# Patient Record
Sex: Female | Born: 1980 | Race: White | Hispanic: No | Smoking: Current every day smoker
Health system: Southern US, Community
[De-identification: ages and names within clinical notes are randomized; demographics above are authoritative.]

## PROBLEM LIST (undated history)

## (undated) DIAGNOSIS — G629 Polyneuropathy, unspecified: Secondary | ICD-10-CM

## (undated) DIAGNOSIS — D649 Anemia, unspecified: Secondary | ICD-10-CM

## (undated) DIAGNOSIS — R87629 Unspecified abnormal cytological findings in specimens from vagina: Secondary | ICD-10-CM

## (undated) DIAGNOSIS — F32A Depression, unspecified: Secondary | ICD-10-CM

## (undated) DIAGNOSIS — F329 Major depressive disorder, single episode, unspecified: Secondary | ICD-10-CM

## (undated) DIAGNOSIS — K7460 Unspecified cirrhosis of liver: Secondary | ICD-10-CM

## (undated) DIAGNOSIS — G43909 Migraine, unspecified, not intractable, without status migrainosus: Secondary | ICD-10-CM

## (undated) DIAGNOSIS — Z3490 Encounter for supervision of normal pregnancy, unspecified, unspecified trimester: Principal | ICD-10-CM

## (undated) HISTORY — DX: Depression, unspecified: F32.A

## (undated) HISTORY — PX: BACK SURGERY: SHX140

## (undated) HISTORY — DX: Unspecified abnormal cytological findings in specimens from vagina: R87.629

## (undated) HISTORY — DX: Major depressive disorder, single episode, unspecified: F32.9

## (undated) HISTORY — DX: Polyneuropathy, unspecified: G62.9

## (undated) HISTORY — DX: Migraine, unspecified, not intractable, without status migrainosus: G43.909

## (undated) HISTORY — DX: Encounter for supervision of normal pregnancy, unspecified, unspecified trimester: Z34.90

## (undated) HISTORY — PX: TONSILLECTOMY AND ADENOIDECTOMY: SHX28

---

## 2001-11-28 ENCOUNTER — Other Ambulatory Visit: Admission: RE | Admit: 2001-11-28 | Discharge: 2001-11-28 | Payer: Self-pay | Admitting: Obstetrics and Gynecology

## 2002-05-31 ENCOUNTER — Other Ambulatory Visit: Admission: RE | Admit: 2002-05-31 | Discharge: 2002-05-31 | Payer: Self-pay | Admitting: Obstetrics and Gynecology

## 2002-10-16 ENCOUNTER — Ambulatory Visit (HOSPITAL_COMMUNITY): Admission: RE | Admit: 2002-10-16 | Discharge: 2002-10-16 | Payer: Self-pay | Admitting: Obstetrics and Gynecology

## 2002-10-16 ENCOUNTER — Encounter: Payer: Self-pay | Admitting: Obstetrics and Gynecology

## 2002-12-27 ENCOUNTER — Other Ambulatory Visit: Admission: RE | Admit: 2002-12-27 | Discharge: 2002-12-27 | Payer: Self-pay | Admitting: Obstetrics and Gynecology

## 2003-02-18 ENCOUNTER — Observation Stay (HOSPITAL_COMMUNITY): Admission: AD | Admit: 2003-02-18 | Discharge: 2003-02-18 | Payer: Self-pay | Admitting: Obstetrics and Gynecology

## 2003-02-19 ENCOUNTER — Encounter: Payer: Self-pay | Admitting: Obstetrics and Gynecology

## 2003-02-19 ENCOUNTER — Ambulatory Visit (HOSPITAL_COMMUNITY): Admission: RE | Admit: 2003-02-19 | Discharge: 2003-02-19 | Payer: Self-pay | Admitting: Obstetrics and Gynecology

## 2003-02-26 ENCOUNTER — Encounter: Payer: Self-pay | Admitting: Obstetrics and Gynecology

## 2003-02-26 ENCOUNTER — Ambulatory Visit (HOSPITAL_COMMUNITY): Admission: RE | Admit: 2003-02-26 | Discharge: 2003-02-26 | Payer: Self-pay | Admitting: Obstetrics and Gynecology

## 2003-02-27 ENCOUNTER — Inpatient Hospital Stay (HOSPITAL_COMMUNITY): Admission: AD | Admit: 2003-02-27 | Discharge: 2003-03-02 | Payer: Self-pay | Admitting: Obstetrics and Gynecology

## 2003-04-17 ENCOUNTER — Other Ambulatory Visit: Admission: RE | Admit: 2003-04-17 | Discharge: 2003-04-17 | Payer: Self-pay | Admitting: Obstetrics and Gynecology

## 2003-08-30 ENCOUNTER — Encounter: Payer: Self-pay | Admitting: Family Medicine

## 2003-08-30 ENCOUNTER — Ambulatory Visit (HOSPITAL_COMMUNITY): Admission: RE | Admit: 2003-08-30 | Discharge: 2003-08-30 | Payer: Self-pay | Admitting: Family Medicine

## 2004-05-21 ENCOUNTER — Other Ambulatory Visit: Admission: RE | Admit: 2004-05-21 | Discharge: 2004-05-21 | Payer: Self-pay | Admitting: Obstetrics and Gynecology

## 2005-08-27 ENCOUNTER — Other Ambulatory Visit: Admission: RE | Admit: 2005-08-27 | Discharge: 2005-08-27 | Payer: Self-pay | Admitting: Obstetrics and Gynecology

## 2006-02-03 ENCOUNTER — Emergency Department (HOSPITAL_COMMUNITY): Admission: EM | Admit: 2006-02-03 | Discharge: 2006-02-03 | Payer: Self-pay | Admitting: Emergency Medicine

## 2006-05-31 ENCOUNTER — Emergency Department (HOSPITAL_COMMUNITY): Admission: EM | Admit: 2006-05-31 | Discharge: 2006-05-31 | Payer: Self-pay | Admitting: Emergency Medicine

## 2006-07-25 ENCOUNTER — Emergency Department (HOSPITAL_COMMUNITY): Admission: EM | Admit: 2006-07-25 | Discharge: 2006-07-25 | Payer: Self-pay | Admitting: Emergency Medicine

## 2007-12-28 ENCOUNTER — Emergency Department (HOSPITAL_COMMUNITY): Admission: EM | Admit: 2007-12-28 | Discharge: 2007-12-28 | Payer: Self-pay | Admitting: Emergency Medicine

## 2008-02-15 ENCOUNTER — Other Ambulatory Visit: Admission: RE | Admit: 2008-02-15 | Discharge: 2008-02-15 | Payer: Self-pay | Admitting: Obstetrics and Gynecology

## 2008-04-06 ENCOUNTER — Ambulatory Visit (HOSPITAL_COMMUNITY): Admission: RE | Admit: 2008-04-06 | Discharge: 2008-04-06 | Payer: Self-pay | Admitting: Obstetrics & Gynecology

## 2008-05-04 ENCOUNTER — Ambulatory Visit (HOSPITAL_COMMUNITY): Admission: RE | Admit: 2008-05-04 | Discharge: 2008-05-04 | Payer: Self-pay | Admitting: Obstetrics & Gynecology

## 2008-06-11 ENCOUNTER — Ambulatory Visit: Payer: Self-pay | Admitting: Gynecology

## 2008-06-11 ENCOUNTER — Inpatient Hospital Stay (HOSPITAL_COMMUNITY): Admission: AD | Admit: 2008-06-11 | Discharge: 2008-06-11 | Payer: Self-pay | Admitting: Gynecology

## 2008-08-09 ENCOUNTER — Inpatient Hospital Stay (HOSPITAL_COMMUNITY): Admission: RE | Admit: 2008-08-09 | Discharge: 2008-08-11 | Payer: Self-pay | Admitting: Obstetrics and Gynecology

## 2008-09-16 ENCOUNTER — Emergency Department (HOSPITAL_COMMUNITY): Admission: EM | Admit: 2008-09-16 | Discharge: 2008-09-16 | Payer: Self-pay | Admitting: Emergency Medicine

## 2009-02-20 ENCOUNTER — Emergency Department (HOSPITAL_COMMUNITY): Admission: EM | Admit: 2009-02-20 | Discharge: 2009-02-20 | Payer: Self-pay | Admitting: Emergency Medicine

## 2009-05-10 ENCOUNTER — Encounter: Admission: RE | Admit: 2009-05-10 | Discharge: 2009-05-10 | Payer: Self-pay | Admitting: Preventative Medicine

## 2009-10-15 ENCOUNTER — Other Ambulatory Visit: Admission: RE | Admit: 2009-10-15 | Discharge: 2009-10-15 | Payer: Self-pay | Admitting: Obstetrics and Gynecology

## 2009-12-10 ENCOUNTER — Ambulatory Visit (HOSPITAL_COMMUNITY): Admission: RE | Admit: 2009-12-10 | Discharge: 2009-12-10 | Payer: Self-pay | Admitting: Neurosurgery

## 2010-06-24 ENCOUNTER — Emergency Department (HOSPITAL_COMMUNITY): Admission: EM | Admit: 2010-06-24 | Discharge: 2010-06-24 | Payer: Self-pay | Admitting: Emergency Medicine

## 2011-03-09 LAB — CBC
HCT: 38.7 % (ref 36.0–46.0)
Hemoglobin: 13.3 g/dL (ref 12.0–15.0)
MCV: 100.3 fL — ABNORMAL HIGH (ref 78.0–100.0)
Platelets: 323 10*3/uL (ref 150–400)
RDW: 13.5 % (ref 11.5–15.5)

## 2011-04-21 NOTE — Op Note (Signed)
Pamela [name], PUGMIRE NO.:  [id number]   MEDICAL RECORD NO.:  [id number]          PATIENT TYPE:  INP   LOCATION:  9144                          FACILITY:  [facility]   PHYSICIAN:  Tilda Burrow, M.D. DATE OF BIRTH:  03/09/1981   DATE OF PROCEDURE:  DATE OF DISCHARGE:                               OPERATIVE REPORT   PREOPERATIVE DIAGNOSES:  1. Pregnancy at 39 weeks and 2 days.  2. Prior cesarean section after trial of labor.   POSTOPERATIVE DIAGNOSES:  1. Pregnancy at 39 weeks and 2 days.  2. Prior cesarean section after trial of labor, delivered.   PROCEDURE:  Repeat low-transverse cervical cesarean section.   SURGEON:  1. Tilda Burrow, MD  2. Valetta Close, MD   ANESTHESIA:  Spinal.   COMPLICATIONS:  None.   </FINDINGS/Healthy 7-pound 12-ounce female infant, Apgars 8 and 9  (Cadence).   ESTIMATED BLOOD LOSS:  800 mL.   INDICATIONS:  A 30 year old gravida 4, para 3, 1 prior C-section, who  declined a VBAC despite repeated offers.   DETAILS OF PROCEDURE:  The patient was taken to the operating room,  prepped and draped in the usual fashion for lower abdominal surgery with  Foley catheter inserted.  A time-out was conducted and procedure  confirmed, and a Pfannenstiel incision repeated with wide excision of  the old cicatrix, removing approximately a 4-cm wide x 25-cm long  ellipse of skin and fatty tissue.  The fascia was opened in a standard  Pfannenstiel technique and bladder flap developed on the lower uterine  segment.  Transverse uterine incision was made and extended laterally  using index finger traction.  Amniotic fluid was clear without malodor  and generous in volume.  Fetal vertex was guided through the incision  with Dr. Wells Guiles right hand with fundal pressure applied.  Cord was  clamped and the infant passed to the awaiting pediatricians.  See their  notes regarding baby's status.   Placenta was delivered by Crede uterine massage.   Membranes intact.  The  uterus was closed using single-layer running-locking closure of 0-  chromic with some oversewing on the left-hand side.   Bladder flap was inspected.  Point cautery used as necessary for  hemostasis.  Bladder was not packed back up.   Anterior peritoneum was inspected.  Peritoneum emptied of any fluid.  Laparotomy tape count was correct.  Anterior peritoneum closed.  The  fascia was then closed using running 0-Vicryl in a continuous fashion  with good tissue edge approximation.  Subcu fatty tissue was contoured  slightly too and 3 interrupted sutures of 2-0 plain used to  reapproximate the subcu fatty tissue at the level of the Scarpa fascia  before staple closure of the skin completed the procedure.  Sponge and  needle counts were correct throughout.  The patient tolerated the  procedure excellent.  Urine remained clear.  The patient went to the  recovery room in stable condition.      Tilda Burrow, M.D.  Electronically Signed     JVF/MEDQ  D:  08/09/2008  T:  08/10/2008  Job:  [id number]   cc:   Lorin Picket A. Gerda Diss, MD  Fax: [phone]   [facility]

## 2011-04-21 NOTE — H&P (Signed)
NAME:  Pamela [name], Pamela [name] NO.:  [id number]   MEDICAL RECORD NO.:  [id number]           PATIENT TYPE:   LOCATION:                                 FACILITY:   PHYSICIAN:  Tilda Burrow, M.D. DATE OF BIRTH:  09-11-1981   DATE OF ADMISSION:  DATE OF DISCHARGE:                              HISTORY & PHYSICAL   ADMISSION DIAGNOSIS:  Pregnancy at 23 plus 2 days gestation, prior  cesarean section x1, prior vaginal delivery x2, not desiring trial of  labor.   HISTORY OF PRESENT ILLNESS:  This 30 year old female gravida 6, para 3-0-  2-3, last menstrual period November 07, 2007, placing [facility] August 14, 2008 with corresponding first trimester and second trimester ultrasounds  is admitted at 39+ weeks' gestation by all criteria for repeat cesarean  section.  Pamela [name] has been followed through our office since February 18  with appropriate weight gain with fundal height growth.  She has had  labs that included blood type A+, rubella immunity present. Initially  urine drug screen positive for opiates and subsequently tests were  negative in third trimester.  She has had hemoglobin 12, hematocrit 38,  hepatitis, HIV, RPR, GC and chlamydia- all negative. Group B strep is  negative at 37 weeks.  Glucose tolerance test 104 mg percent.  She plans  to breast feed, bottle supplement and plans IUD of future contraception.  She has been offered a trial of labor.  Has received and reviewed VBAC  consent forms and after consideration of her options, has decided to  proceed with repeat cesarean section.  She is scheduled for 7:30 on  August 09, 2008.   PAST MEDICAL HISTORY:  Positive for headache.   SURGICAL HISTORY:  Cesarean section times one.   OB HISTORY:  Notable for two vaginal deliveries in 1997 and 2001,  followed by cesarean section, breech at [facility] at [redacted] weeks  gestation in 2004 delivering a 10 pounds infant.   MEDICATIONS:  1. Phenergan.  2. Hydrocodone at  times during the pregnancy.   She is single, lives with baby's father, unemployed and in school full  time.   She has no allergies.   PHYSICAL EXAM:  Height _________, weight 148, blood pressure 100/50,  pulse 70s.  Pupils equal round reactive, fundal height 36-37 cm. Cervix  1 to 2 and 50%, -1 vertex low in the pelvis.   PLAN:  Repeat cesarean section on August 08, 2008.      Tilda Burrow, M.D.  Electronically Signed     JVF/MEDQ  D:  08/08/2008  T:  08/08/2008  Job:  [id number]   cc:   [facility]   Scott A. Gerda Diss, MD  Fax: [phone]

## 2011-04-21 NOTE — Discharge Summary (Signed)
NAMECATHYE, Pamela [name] NO.:  [id number]   MEDICAL RECORD NO.:  [id number]          PATIENT TYPE:  INP   LOCATION:  9144                          [facility]ILITY[facility]  [facility]   PHYSICIAN:  Tanya S. Shawnie [name], M.D.   DATE OF BIRTH:  1981-10-01   DATE OF ADMISSION:  08/09/2008  DATE OF DISCHARGE:  08/11/2008                               DISCHARGE SUMMARY   ADMITTING DIAGNOSES:  1. Intrauterine pregnancy at term.  2. Prior cesarean section and desires repeat.   DISCHARGE DIAGNOSES:  1. Intrauterine pregnancy at term.  2. Prior cesarean section and desires repeat.   PROCEDURE:  Repeat low transverse cesarean section.   HOSPITAL COURSE:  Pamela [name] is a 30 year old single female, gravida 6,  para 3-0-2-3, who was admitted at 39-2/7th weeks for scheduled repeat  cesarean section.  Her pregnancy has been followed by the [facility] and has been remarkable for,  1. Vaginal deliveries in 1997 and 2001 followed by cesarean section in      2004.  2. History of macrosomia.  The patient tolerated the cesarean section      well.   SURGEONS:  1. Tilda Burrow, MD  2. Valetta Close, MD   Infant was a viable female, weight of 7 pounds 12 ounces, Apgars of 8  and 9.  Infant was taken to the full-term nursery in good condition.  The patient was taken to the recovery room and was doing well.  By  postop day #1, her vital signs were stable.  Her hemoglobin was 9.5 and  had been 10.7 preoperatively.  She was breastfeeding.  Postop day #2, he  was doing well and desired to have an early discharge home.  Vital signs  remained stable.  She expressed a desire for an IUD to be placed at her  postpartum visit.  The patient was deemed to have received a full  benefit of her hospital stay and she was discharged home.   DISCHARGE MEDICATIONS:  1. Motrin 600 mg 1 p.o. q.6 h. p.r.n. pain.  2. Tylox 1-2 p.o. q.3-4 h. p.r.n. pain.   DISCHARGE INSTRUCTIONS:  Per postpartum handout,  discharge followup will  occur at [facility] on August 13, 2008, or August 14, 2008, for  staple removal.       Pamela [name], C.N.M.      Pamela Proctor. Shawnie [name], M.D.  Electronically Signed    KS/MEDQ  D:  08/11/2008  T:  08/11/2008  Job:  [id number]

## 2011-04-24 NOTE — H&P (Signed)
NAME:  Pamela [name], Pamela [name] NO.:  [id number]   MEDICAL RECORD NO.:  [id number]                   PATIENT TYPE:  INP   LOCATION:  NA                                   [facility]   PHYSICIAN:  Zenaida Niece, M.D.             DATE OF BIRTH:  Feb 02, 1981   DATE OF ADMISSION:  02/27/2003  DATE OF DISCHARGE:                                HISTORY & PHYSICAL   CHIEF COMPLAINT:  Primary cesarean section for breech presentation.   HISTORY OF PRESENT ILLNESS:  The patient is a 30 year old white female  gravida 5, para 2-0-0-2 with an EGA of [redacted] weeks by an LMP consistent with a  9-week ultrasound with a due date of 03/18/2003 who presents for cesarean  section due to breech presentation.  The patient was seen and admitted for  observation on 02/18/2003 for preterm contractions.  She progressed to 3 cm  but did not progress past that.  Ultrasound the next day for size greater  than dates revealed an estimated fetal weight of approximately 3800 g and a  breech presentation with a normal AFI of 18.5.  This was discussed with the  patient and options were given to try an external cephalic version or  proceed with cesarean section.  The patient wished to proceed with cesarean  section as soon as possible as she was miserable with pelvic pain and pelvic  pressure.  Amniocentesis was performed on 02/26/2003 which documents fetal  lung maturity and the patient is admitted for cesarean section.  Prenatal  care has been essentially uncomplicated except for the above-mentioned  problems.   PRENATAL LABORATORIES:  Blood type is A positive with a negative antibody  screen, RPR nonreactive, rubella immune, hepatitis B surface antigen  negative, gonorrhea and Chlamydia negative, triple screen was declined, 1-  hour glucola was 124, group B strep is negative.   PAST OBSTETRICAL HISTORY:  In 1997 vaginal delivery at 42 weeks, 8 pounds 11  ounces, no complications.  In 1998  and 1999 she had spontaneous abortions at  59 and 16 weeks.  In 2001 she had a vaginal delivery at 38 weeks, 8 pounds 1  ounce, no complications.   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   PAST MEDICAL HISTORY:  History of frequent UTIs and history of migraine  headaches.   PAST SURGICAL HISTORY:  None.   ALLERGIES:  None known.   CURRENT MEDICATIONS:  Darvocet p.r.n. pain.   SOCIAL HISTORY:  She is married and denies alcohol, tobacco, or drug use.   PHYSICAL EXAMINATION:  GENERAL:  She is a well-developed, well-nourished,  gravid female in no acute distress.  VITAL SIGNS:  Weight is 175 pounds, blood pressure 118/60.  NECK:  Supple without lymphadenopathy or thyromegaly.  LUNGS:  Clear to auscultation.  HEART:  Regular rate and rhythm without murmur.  ABDOMEN:  Gravid, nontender, with a fundal height of 41 cm and an estimated  fetal weight from 8-9 pounds and breech presentation.  EXTREMITIES:  Legs have 1+ edema, are nontender, DTRs are 2/4 and symmetric.  VAGINAL:  Per her last exam was approximately 3 cm dilated and again with a  breech presentation.   ASSESSMENT:  1. Intrauterine pregnancy  at 37 weeks with mature fetal lung studies by     amniocentesis.  2. Breech presentation.  Options have been discussed with the patient and     she wishes to proceed with cesarean section.  Risks of cesarean section     including bleeding, infection, and damage to the surrounding organs have     been discussed with the patient.   PLAN:  Admit the patient for a primary cesarean section for a breech  presentation.                                               Zenaida Niece, M.D.    TDM/MEDQ  D:  02/26/2003  T:  02/26/2003  Job:  [id number]

## 2011-04-24 NOTE — Discharge Summary (Signed)
NAME:  Pamela [name], Pamela [name]                        ACCOUNT NO.:  [id number]   MEDICAL RECORD NO.:  [id number]                   PATIENT TYPE:  INP   LOCATION:  9107                                 [facility]  [facility]   PHYSICIAN:  Huel Cote, M.D.              DATE OF BIRTH:  10-07-1981   DATE OF ADMISSION:  02/27/2003  DATE OF DISCHARGE:  03/02/2003                                 DISCHARGE SUMMARY   DISCHARGE DIAGNOSES:  1. Term pregnancy at [redacted] weeks gestation.  2. Primary cesarean section for breech presentation.  3. Status post amniocentesis on 02/26/03 with documented fetal lung maturity.  4. Status post low transverse cesarean section.   DISCHARGE MEDICATIONS:  1. Motrin 600 mg p.o. q.6h.  2. Percocet 1-2 tablets q.3-4h p.r.n.   DISCHARGE FOLLOW UP:  The patient is to follow up in two weeks for an  incision check and again in six weeks or her routine postpartum exam.   HOSPITAL COURSE:  The patient is a 30 year old G5, P2, 0, 0, 2, who was  admitted at 37 weeks for an elective cesarean section given breech  presentation.  The patient as stated previously did have documented fetal  lung maturity by amniocentesis which was performed secondary to her  complaints of pelvic pain and back pain and the desire to proceed with  cesarean section as soon as possible.  She did have preterm contractions  which dilated her to 3 cm, however, showed no progress beyond that point.  Fetal ultrasound for satisfaction of dates demonstrated a fetus of 3800  grams with a breech presentation and a normal AFI.  The patient was given  option of external cephalic version, however elective cesarean section was  chosen.   PRENATAL LABS:  A positive.  Antibody negative. RPR nonreactive.  Rubella  immune.  Hepatitis B surface antigen negative.  GC negative, Chlamydia  negative.  Triple screen declined.  One hour Glucola normal.  Group B strep  negative.   PAST OB HISTORY:  In 1997 she had a vaginal  delivery at 42 weeks of an 8  pound, 11 ounce infant.  In 1988 and 1999 she had spontaneous miscarriages.  In 2001 she had a normal spontaneous vaginal delivery at 38 weeks of an 8  pound, one ounce infant.   PAST GYN HISTORY:  History of CIN1 with stable Pap smears throughout her  pregnancy.   PAST MEDICAL HISTORY:  Frequent urinary tract infections and migraines.   PAST SURGICAL HISTORY:  None.   ALLERGIES:  None.   CURRENT MEDICATIONS:  Darvocet for her pelvic pain and back pain.   PHYSICAL EXAM ON ADMISSION:  She was afebrile with stable vital signs.  Fetal heart rate was reassuring.  She had an estimated fetal weight of 8-9  pounds and her fundal height was 41.  The patient underwent a low transverse cesarean section on 02/27/03 without  difficulty.  She  was delivered of a viable female infant, Apgar's were 8 and  9, weight was 9 pounds, 11 ounces.  She was then admitted for routine  postoperative care and did well.  Her postoperative hemoglobin did drop to  6.9 from 9.0, however, she was asymptomatic.  Her remaining postoperative  course was uneventful and on postoperative day number three she was  tolerating a regular diet, able to ambulate, she did have some soreness at  her incision site which was controlled with Motrin and Percocet.  Her  incision was clear on discharge and her Prolene suture removed.  Her abdomen  was soft and nontender.  Therefore she was felt stable to discharge home  with follow up as previously stated.                                               Huel Cote, M.D.    KR/MEDQ  D:  03/02/2003  T:  03/02/2003  Job:  [id number]

## 2011-04-24 NOTE — Op Note (Signed)
NAME:  Pamela [name], Pamela [name]                        ACCOUNT NO.:  [id number]   MEDICAL RECORD NO.:  [id number]                   PATIENT TYPE:  OUT   LOCATION:  ULT                                  [facility]   PHYSICIAN:  Zenaida Niece, M.D.             DATE OF BIRTH:  [birth year]   DATE OF PROCEDURE:  02/27/2003  DATE OF DISCHARGE:                                 OPERATIVE REPORT   PREOPERATIVE DIAGNOSES:  1. Intrauterine pregnancy at 37 weeks with mature fetal lung studies.  2. Breech presentation.  3. Fetal macrosomia.   POSTOPERATIVE DIAGNOSES:  1. Intrauterine pregnancy at 37 weeks with mature fetal lung studies.  2. Breech presentation.  3. Fetal macrosomia.   PROCEDURE:  Primary low transverse cesarean section without extensions.   SURGEON:  Zenaida Niece, M.D.   ASSISTANT:  Malachi Pro. Ambrose Mantle, M.D.   ANESTHESIA:  Spinal.   ESTIMATED BLOOD LOSS:  1200 mL.   FINDINGS:  The patient had normal anatomy and delivered a viable female  infant in the frank breech presentation with Apgars of 8 and 9 that weighed  9 pounds 11 ounces.   PROCEDURE IN DETAIL:  The patient was taken to the operating room and placed  in the sitting position.  Raul Del, M.D. instilled spinal anesthesia  and she was placed in the dorsal supine position with left lateral tilt.  Her abdomen was prepped and draped in the usual sterile fashion and a Foley  catheter inserted.  The level of her anesthesia was found to be adequate and  her abdomen was entered via a standard Pfannenstiel incision.  Vesicouterine  peritoneum was incised and a bladder flap created digitally.  A 4 cm  transverse incision was made in the lower uterine segment.  Brisk bleeding  was noted from a venous sinus just short of the endometrial cavity.  The  endometrial cavity was entered bluntly and the incision extended laterally  digitally with ruptured membranes of clear fluid.  The fetal breech was at  the incision  and was delivered from the frank breech presentation.  The baby  delivered to the shoulders where the arms were swept across the chest and  the head delivered easily.  The mouth and nares were suctioned.  The cord  was doubly clamped and cut and the infant handed to the waiting pediatric  team.  Cord blood and a segment of cord for gas were obtained and the  placenta delivered spontaneously.  Ring forceps were used to control  bleeding from the lower edge of the uterine incision.  The uterus was wiped  dry with a clean lap pad and found to palpate normal.  The incision was  inspected and found to be free of extensions.  It was then closed in one  layer being a running locking layer with number 1 chromic with very good  hemostasis.  Bleeding from serosal edges  was controlled with electrocautery.  Tubes and ovaries were inspected and found to be normal and the gutters were  blotted of all clots and debris.  The uterine incision was again inspected  and found to be hemostatic.  The subfascial space was irrigated and made  hemostatic with electrocautery.  Fascia was closed in a running fashion  starting at both ends and meeting in the middle with 0 Vicryl.  Subcutaneous  tissue was then irrigated and made hemostatic with electrocautery.  Skin was  closed with a running subcuticular suture of 4-0 Prolene followed by Steri-  Strips and a sterile bandage.  The patient tolerated the procedure well and  was taken to the recovery room in stable condition.  Counts were correct x2  and she received Ancef 1 g after cord clamp.                                               Zenaida Niece, M.D.    TDM/MEDQ  D:  02/27/2003  T:  02/27/2003  Job:  [id number]

## 2011-06-02 ENCOUNTER — Other Ambulatory Visit: Payer: Self-pay | Admitting: Family Medicine

## 2011-06-02 ENCOUNTER — Ambulatory Visit (HOSPITAL_COMMUNITY)
Admission: RE | Admit: 2011-06-02 | Discharge: 2011-06-02 | Disposition: A | Discharge status: Home / Self Care | Payer: Medicaid Other | Source: Ambulatory Visit | Attending: Family Medicine | Admitting: Family Medicine

## 2011-06-02 DIAGNOSIS — M25512 Pain in left shoulder: Secondary | ICD-10-CM

## 2011-06-02 DIAGNOSIS — M25519 Pain in unspecified shoulder: Secondary | ICD-10-CM | POA: Insufficient documentation

## 2011-07-30 ENCOUNTER — Encounter: Payer: Self-pay | Admitting: Orthopedic Surgery

## 2011-07-30 ENCOUNTER — Ambulatory Visit (INDEPENDENT_AMBULATORY_CARE_PROVIDER_SITE_OTHER): Payer: Medicaid Other | Admitting: Orthopedic Surgery

## 2011-07-30 VITALS — Resp 16 | Ht 64.0 in | Wt 121.0 lb

## 2011-07-30 DIAGNOSIS — S43006A Unspecified dislocation of unspecified shoulder joint, initial encounter: Secondary | ICD-10-CM

## 2011-07-30 DIAGNOSIS — S43002A Unspecified subluxation of left shoulder joint, initial encounter: Secondary | ICD-10-CM | POA: Insufficient documentation

## 2011-07-30 DIAGNOSIS — M67919 Unspecified disorder of synovium and tendon, unspecified shoulder: Secondary | ICD-10-CM

## 2011-07-30 DIAGNOSIS — M75102 Unspecified rotator cuff tear or rupture of left shoulder, not specified as traumatic: Secondary | ICD-10-CM

## 2011-07-30 NOTE — Progress Notes (Signed)
Chief complaint: Pain LEFT shoulder for 2 months HPI:(74) 30 years old, female presents with an interesting history of pain and popping in her LEFT shoulder which started in June.  Symptoms started suddenly while she was asleep she woke up had pain and stiffness in the LEFT shoulder.  The arm felt stuck.  She treated herself with vital freeze.  She went to the beach had a loud pop in the shoulder and her night pain disappeared however she still has a dull ache which is 9/10 tends to come and go is worse at night.  She has some numbness in her upper extremities with tingling.  Has carpal tunnel syndrome as well.  Reports locking and catching of the shoulder and frequent popping.  She feels that the shoulder is loose.    ROS:(2) Fatigue joint pain  PFSH: (1) History reviewed. No pertinent past medical history.  Past Surgical History  Procedure Date  . Cesarean section x 2  . Back surgery       Physical Exam(12) GENERAL: normal development   CDV: pulses are normal   Skin: normal  Lymph: nodes were not palpable/normal  Psychiatric: awake, alert and oriented  Neuro: normal sensation  MSK RIGHT shoulder full range of motion.  No tenderness pain or swelling.  Strength normal.  Shoulder stable.  LEFT shoulder no tenderness no swelling no deformity.  Range of motion is normal.  Strength is normal.  Stability is normal.  No apprehension.  No inferior subluxation.  Imaging: Plain films at the hospital negative for any acute abnormality  Assessment: Rotator cuff syndrome vs. Shoulder instability.  Also may have had a adhesive capsulitis which was relieved by the popping sensation.  We offered her an injection she declined she got at therapy    Plan: Home exercise program no surgical intervention needed

## 2011-07-30 NOTE — Patient Instructions (Signed)
Go to OT for home ex program

## 2011-09-03 LAB — URINALYSIS, ROUTINE W REFLEX MICROSCOPIC
Bilirubin Urine: NEGATIVE
Hgb urine dipstick: NEGATIVE
Ketones, ur: NEGATIVE
Nitrite: NEGATIVE
Urobilinogen, UA: 0.2
pH: 6.5

## 2011-09-09 LAB — CBC
HCT: 26.9 — ABNORMAL LOW
HCT: 30.5 — ABNORMAL LOW
MCHC: 35
MCV: 102.5 — ABNORMAL HIGH
Platelets: 301
Platelets: 334
RDW: 13.4
RDW: 13.6
WBC: 12.3 — ABNORMAL HIGH
WBC: 16.1 — ABNORMAL HIGH

## 2011-09-09 LAB — DIFFERENTIAL
Basophils Absolute: 0
Basophils Relative: 0
Eosinophils Absolute: 0.2
Eosinophils Relative: 2
Neutrophils Relative %: 66

## 2011-09-09 LAB — TYPE AND SCREEN: ABO/RH(D): A POS

## 2011-09-09 LAB — COMPREHENSIVE METABOLIC PANEL
ALT: 10
Alkaline Phosphatase: 106
BUN: 4 — ABNORMAL LOW
CO2: 24
GFR calc non Af Amer: >60
Glucose, Bld: 87
Potassium: 3.4 — ABNORMAL LOW
Sodium: 135
Total Protein: 5.5 — ABNORMAL LOW

## 2011-09-09 LAB — RPR: RPR Ser Ql: NONREACTIVE

## 2011-09-09 LAB — ABO/RH: ABO/RH(D): A POS

## 2012-03-21 ENCOUNTER — Other Ambulatory Visit (HOSPITAL_COMMUNITY)
Admission: RE | Admit: 2012-03-21 | Discharge: 2012-03-21 | Disposition: A | Discharge status: Home / Self Care | Payer: Medicaid Other | Source: Ambulatory Visit | Attending: Obstetrics and Gynecology | Admitting: Obstetrics and Gynecology

## 2012-03-21 ENCOUNTER — Other Ambulatory Visit: Payer: Self-pay | Admitting: Adult Health

## 2012-03-21 DIAGNOSIS — Z1159 Encounter for screening for other viral diseases: Secondary | ICD-10-CM | POA: Insufficient documentation

## 2012-03-21 DIAGNOSIS — Z01419 Encounter for gynecological examination (general) (routine) without abnormal findings: Secondary | ICD-10-CM | POA: Insufficient documentation

## 2014-04-19 ENCOUNTER — Ambulatory Visit (INDEPENDENT_AMBULATORY_CARE_PROVIDER_SITE_OTHER): Payer: BC Managed Care – PPO | Admitting: Adult Health

## 2014-04-19 ENCOUNTER — Encounter: Payer: Self-pay | Admitting: Adult Health

## 2014-04-19 VITALS — BP 110/60 | HR 74 | Ht 64.0 in | Wt 144.5 lb

## 2014-04-19 DIAGNOSIS — Z30432 Encounter for removal of intrauterine contraceptive device: Secondary | ICD-10-CM

## 2014-04-19 DIAGNOSIS — Z01419 Encounter for gynecological examination (general) (routine) without abnormal findings: Secondary | ICD-10-CM

## 2014-04-19 NOTE — Patient Instructions (Signed)
Physical and pap in 1 year Mammogram  At 39

## 2014-04-19 NOTE — Progress Notes (Signed)
Patient ID: Pamela [name], female   DOB: [birth year], 33 y.o.   MRN: [id number] History of Present Illness: Pamela [name] is a 33 year old white female in for a physical and have IUD removed.Her last pap was 03/21/12 and was normal with negative HPV.She has been in female relationship for 6 years.She has had migraines and the IUD helped, but wants it out, has been in over 5 1/2 years.  Current Medications, Allergies, Past Medical History, Past Surgical History, Family History and Social History were reviewed in [facility] electronic medical record.     Review of Systems: Patient denies any frequent headaches, blurred vision, shortness of breath, chest pain, abdominal pain, problems with bowel movements, urination, or intercourse.No joint pain or mood swings.     Physical Exam:BP 110/60  Pulse 74  Ht 5\' 4"  (1.626 m)  Wt 144 lb 8 oz (65.545 kg)  BMI 24.79 kg/m2 General:  Well developed, well nourished, no acute distress Skin:  Warm and dry Neck:  Midline trachea, normal thyroid Lungs; Clear to auscultation bilaterally Breast:  No dominant palpable mass, retraction, or nipple discharge Cardiovascular: Regular rate and rhythm Abdomen:  Soft, non tender, no hepatosplenomegaly Pelvic:  External genitalia is normal in appearance.  The vagina is normal in appearance.  The cervix is bulbous, IUD easily removed.  Uterus is felt to be normal size, shape, and contour.  No                adnexal masses or tenderness noted. Extremities:  No swelling or varicosities noted Psych:  No mood changes,alert and cooperative,seems happy   Impression: Yearly gyn exam no pap IUD removal    Plan: Physical and pap in 1 year Mammogram at 39 Pt to call if wants IUD reinserted for headache control.

## 2014-05-16 ENCOUNTER — Encounter: Payer: Self-pay | Admitting: Family Medicine

## 2014-05-16 ENCOUNTER — Ambulatory Visit (INDEPENDENT_AMBULATORY_CARE_PROVIDER_SITE_OTHER): Payer: BC Managed Care – PPO | Admitting: Family Medicine

## 2014-05-16 VITALS — BP 118/80 | Temp 98.7°F | Ht 64.0 in | Wt 138.0 lb

## 2014-05-16 DIAGNOSIS — J069 Acute upper respiratory infection, unspecified: Secondary | ICD-10-CM

## 2014-05-16 DIAGNOSIS — H6690 Otitis media, unspecified, unspecified ear: Secondary | ICD-10-CM

## 2014-05-16 DIAGNOSIS — H6693 Otitis media, unspecified, bilateral: Secondary | ICD-10-CM

## 2014-05-16 MED ORDER — AMOXICILLIN 500 MG PO TABS: ORAL_TABLET | ORAL | Status: AC

## 2014-05-16 MED ORDER — HYDROCODONE-ACETAMINOPHEN 5-325 MG PO TABS: 1.0000 | ORAL_TABLET | Freq: Four times a day (QID) | ORAL | Status: DC | PRN

## 2014-05-16 NOTE — Progress Notes (Signed)
   Subjective:    Patient ID: Pamela [name], female    DOB: [birth year], 33 y.o.   MRN: [id number]  Otalgia  There is pain in both ears. Associated symptoms include headaches. Associated symptoms comments: dizziness. Treatments tried: sudafed, tylenol, sinus allegry meds, ibuprofen.   Patient does smoke No prior problems with this  Review of Systems  HENT: Positive for ear pain.   Neurological: Positive for headaches.   Denies cough wheeze nausea vomiting diarrhea    Objective:   Physical Exam  Bilateral otitis media nostrils normal throat is normal neck is supple lungs clear      Assessment & Plan:  Upper respiratory illness along with bilateral otitis media antibiotics prescribed warning signs discussed followup if ongoing troubles

## 2014-08-06 ENCOUNTER — Ambulatory Visit (INDEPENDENT_AMBULATORY_CARE_PROVIDER_SITE_OTHER): Payer: BC Managed Care – PPO | Admitting: Nurse Practitioner

## 2014-08-06 ENCOUNTER — Encounter: Payer: Self-pay | Admitting: Nurse Practitioner

## 2014-08-06 VITALS — BP 124/82 | Ht 64.0 in | Wt 132.0 lb

## 2014-08-06 DIAGNOSIS — M545 Low back pain, unspecified: Secondary | ICD-10-CM

## 2014-08-06 MED ORDER — METHOCARBAMOL 750 MG PO TABS: 750.0000 mg | ORAL_TABLET | Freq: Three times a day (TID) | ORAL | Status: DC | PRN

## 2014-08-06 MED ORDER — HYDROCODONE-ACETAMINOPHEN 5-325 MG PO TABS: 1.0000 | ORAL_TABLET | ORAL | Status: DC | PRN

## 2014-08-06 NOTE — Patient Instructions (Signed)
Icy Hot Smart Relief TENS unit 

## 2014-08-08 ENCOUNTER — Encounter: Payer: Self-pay | Admitting: Family Medicine

## 2014-08-09 ENCOUNTER — Encounter: Payer: Self-pay | Admitting: Nurse Practitioner

## 2014-08-09 NOTE — Progress Notes (Signed)
Subjective:  Presents for a flareup of her low back pain. Has chronic back pain which usually will resolve without difficulty. Did some lifting at the end of last week, has had pain since then. Has a history of back surgery, at that time had left-sided back pain with sciatica. This is right side into the buttock, does not go down the leg. Feels like her back is "stuck". Had a few pain pills left over, has been taking this at nighttime to relieve the pain so she can sleep. Does not have any muscle relaxants. No relief with naproxen, ibuprofen seems to work better. No change in bowel or bladder habits.  Objective:   BP 124/82  Ht  (1.626 m)  Wt 132 lb (59.875 kg)  BMI 22.65 kg/m2 NAD. Alert, oriented. Lungs clear. Heart regular rate rhythm. Distinct tenderness and tight muscles noted along the lower thoracic/lumbar area. Reflexes normal limit lower extremities. SLR negative bilaterally.  Assessment: Right-sided low back pain without sciatica Low back strain  Plan:  Meds ordered this encounter  Medications  . methocarbamol (ROBAXIN) 750 MG tablet    Sig: Take 1 tablet (750 mg total) by mouth every 8 (eight) hours as needed for muscle spasms.    Dispense:  30 tablet    Refill:  0    Order Specific Question:  Supervising Provider    Answer:  Merlyn Albert [[id number]]  . HYDROcodone-acetaminophen (NORCO/VICODIN) 5-325 MG per tablet    Sig: Take 1 tablet by mouth every 4 (four) hours as needed for severe pain.    Dispense:  30 tablet    Refill:  0    Order Specific Question:  Supervising Provider    Answer:  Riccardo Dubin   ice/heat applications. Resume back exercises that she was doing previously after back surgery. OTC TENS unit. Hold on naproxen. Ibuprofen 800 mg 3 times a day when necessary. Call back next week if no improvement, sooner if worse.

## 2014-08-14 ENCOUNTER — Telehealth: Payer: Self-pay | Admitting: Nurse Practitioner

## 2014-08-14 ENCOUNTER — Other Ambulatory Visit: Payer: Self-pay | Admitting: Nurse Practitioner

## 2014-08-14 MED ORDER — HYDROCODONE-ACETAMINOPHEN 5-325 MG PO TABS: 1.0000 | ORAL_TABLET | ORAL | Status: DC | PRN

## 2014-08-14 MED ORDER — METHOCARBAMOL 750 MG PO TABS: 750.0000 mg | ORAL_TABLET | Freq: Three times a day (TID) | ORAL | Status: DC | PRN

## 2014-08-14 NOTE — Telephone Encounter (Signed)
Will refill one time. With the level of pain she is having and the amount of pain med she is taking, she will need to be seen before any more refills. Thanks.

## 2014-08-14 NOTE — Telephone Encounter (Signed)
I saw her for back pain on 8/31 and refilled her meds at that time. Given 30 Hydrocodone and generic Robaxin.

## 2014-08-14 NOTE — Telephone Encounter (Signed)
Patient said that she was supposed to follow up this week on her back pain that she was seen for on 08/06/2014. She did not make an appointment, because she didn't know her schedule. She said after today, she cannot come in the rest of the week, because she is the only one in the office. She wants to know if we can refill her hydrocodone and methocarbamol? Or does she need to be seen?   [facility]

## 2014-08-14 NOTE — Telephone Encounter (Signed)
Notified patient will refill one time. With the level of pain she is having and the amount of pain med she is taking, she will need to be seen before any more refills. Patient verbalized understanding. Script ready for pickup.

## 2014-08-14 NOTE — Telephone Encounter (Signed)
Patient stated that she is unable to come in this week for a office visit and needs a refill on her Hydrocodone and Robaxin. She is still experiencing back pain. Patient has seen a specialist in the past for a herniated disc but she states she feels this is not a disc problem, this is her muscles.

## 2014-10-08 ENCOUNTER — Encounter: Payer: Self-pay | Admitting: Nurse Practitioner

## 2014-11-26 ENCOUNTER — Other Ambulatory Visit: Payer: Self-pay | Admitting: *Deleted

## 2014-11-26 ENCOUNTER — Telehealth: Payer: Self-pay | Admitting: *Deleted

## 2014-11-26 MED ORDER — PENICILLIN V POTASSIUM 500 MG PO TABS: 500.0000 mg | ORAL_TABLET | Freq: Three times a day (TID) | ORAL | Status: DC

## 2014-11-26 MED ORDER — HYDROCODONE-ACETAMINOPHEN 5-325 MG PO TABS: 1.0000 | ORAL_TABLET | Freq: Four times a day (QID) | ORAL | Status: DC | PRN

## 2014-11-26 NOTE — Telephone Encounter (Signed)
Pt requesting something for pain. She broke off her tooth awhile back. She states tooth starting hurting really bad this weekend. Called dentist and they are closed all week. Taking ibuprofen 800mg  with no relief. Consult with Dr. Brett CanalesSteve. pcn 500 one tid for 10 days. Hydrocodone 5/325 #18 one q 6 hours prn.

## 2014-11-26 NOTE — Telephone Encounter (Signed)
pcn sent to pharm. Hydrocodone up front for pickup. Pt notified of prescriptions. Also to get in with dentist asap

## 2015-02-08 ENCOUNTER — Other Ambulatory Visit: Payer: Self-pay | Admitting: Nurse Practitioner

## 2015-02-08 ENCOUNTER — Telehealth: Payer: Self-pay | Admitting: Family Medicine

## 2015-02-08 MED ORDER — TRAMADOL HCL 50 MG PO TABS: 50.0000 mg | ORAL_TABLET | Freq: Three times a day (TID) | ORAL | Status: DC | PRN

## 2015-02-08 NOTE — Telephone Encounter (Signed)
Patient notified and verbalized understanding. 

## 2015-02-08 NOTE — Telephone Encounter (Signed)
Pt would like some meds called in again for reoccuring back pain  Has been taking the Robaxin and 800mg  Ibuprofen she has been taking Not helping, she also took another persons Tramadol  Would like to try that tramadol but will take a refill on Hydrocodone if need be   Please call when ready thanks

## 2015-02-08 NOTE — Telephone Encounter (Signed)
Would like something to get her through the weekend. She will schedule an OV with you next week, if she needs to.   Last seen 08/06/14

## 2015-02-08 NOTE — Telephone Encounter (Signed)
Rx done for Tramadol. Office visit if pain persists

## 2015-02-22 ENCOUNTER — Encounter: Payer: Self-pay | Admitting: Nurse Practitioner

## 2015-02-22 ENCOUNTER — Ambulatory Visit (INDEPENDENT_AMBULATORY_CARE_PROVIDER_SITE_OTHER): Payer: BLUE CROSS/BLUE SHIELD | Admitting: Nurse Practitioner

## 2015-02-22 VITALS — BP 120/70 | Temp 98.3°F | Ht 64.0 in | Wt 124.4 lb

## 2015-02-22 DIAGNOSIS — M5442 Lumbago with sciatica, left side: Secondary | ICD-10-CM | POA: Diagnosis not present

## 2015-02-22 DIAGNOSIS — F418 Other specified anxiety disorders: Secondary | ICD-10-CM

## 2015-02-22 MED ORDER — TRAMADOL HCL 50 MG PO TABS: 50.0000 mg | ORAL_TABLET | Freq: Three times a day (TID) | ORAL | Status: DC | PRN

## 2015-02-22 MED ORDER — METHOCARBAMOL 750 MG PO TABS: 750.0000 mg | ORAL_TABLET | Freq: Three times a day (TID) | ORAL | Status: DC | PRN

## 2015-02-22 MED ORDER — CITALOPRAM HYDROBROMIDE 20 MG PO TABS
ORAL_TABLET | ORAL | Status: DC
Start: 2015-02-22 — End: 2015-06-02

## 2015-02-22 MED ORDER — HYDROCODONE-ACETAMINOPHEN 5-325 MG PO TABS: 1.0000 | ORAL_TABLET | Freq: Four times a day (QID) | ORAL | Status: DC | PRN

## 2015-02-22 MED ORDER — PREDNISONE 20 MG PO TABS: ORAL_TABLET | ORAL | Status: DC

## 2015-02-23 ENCOUNTER — Encounter: Payer: Self-pay | Admitting: Nurse Practitioner

## 2015-02-23 NOTE — Progress Notes (Signed)
Subjective:  Presents for c/o low back pain mainly on left side. History of low back pain and surgery. Has a chronic history of low back pain usually tolerable. Always has come discomfort down posterior left thigh. No recent injury. Worse x 2 months. Now having pain in left buttock. Back will get "stuck" at times. Takes muscle relaxant at bedtime which helps. Worse with certain shoes, cleaning, prolonged sitting and getting out of car for example. Also some depression and anxiety symptoms. Sleep disturbance, emotional lability, decreased appetite and weight loss. Denies suicidal or homicidal thoughts or ideation.   Objective:   BP 120/70 mmHg  Temp(Src) 98.3 F (36.8 C) (Oral)  Ht 5\' 4"  (1.626 m)  Wt 124 lb 6 oz (56.416 kg)  BMI 21.34 kg/m2 NAD. Alert, oriented. Lungs clear. Heart RRR. Tenderness in mid left buttock. SLR neg bilat. Reflexes nl lower extremities. Gait slow but steady.   Assessment: Left-sided low back pain with left-sided sciatica  Depression with anxiety  Plan:  Meds ordered this encounter  Medications  . predniSONE (DELTASONE) 20 MG tablet    Sig: 3 po qd x 3 d then 2 po qd x 3 d then 1 po qd x 3 d    Dispense:  18 tablet    Refill:  0    Order Specific Question:  Supervising Provider    Answer:  Merlyn AlbertLUKING, WILLIAM S [[id number]]  . HYDROcodone-acetaminophen (NORCO/VICODIN) 5-325 MG per tablet    Sig: Take 1 tablet by mouth every 6 (six) hours as needed.    Dispense:  18 tablet    Refill:  0    Order Specific Question:  Supervising Provider    Answer:  Merlyn AlbertLUKING, WILLIAM S [[id number]]  . methocarbamol (ROBAXIN) 750 MG tablet    Sig: Take 1 tablet (750 mg total) by mouth every 8 (eight) hours as needed for muscle spasms.    Dispense:  30 tablet    Refill:  0    Order Specific Question:  Supervising Provider    Answer:  Merlyn AlbertLUKING, WILLIAM S [[id number]]  . traMADol (ULTRAM) 50 MG tablet    Sig: Take 1 tablet (50 mg total) by mouth every 8 (eight) hours as needed.    Dispense:  30 tablet     Refill:  0    [facility]    Order Specific Question:  Supervising Provider    Answer:  Merlyn AlbertLUKING, WILLIAM S [[id number]]  . citalopram (CELEXA) 20 MG tablet    Sig: 1/2 tab po qhs x 6 d then one po qhs    Dispense:  30 tablet    Refill:  2    Order Specific Question:  Supervising Provider    Answer:  Merlyn AlbertLUKING, WILLIAM S [[id number]]   Ice/heat to back area. Stretching exercises. TENS unit. Discussed importance of stress reduction. Discussed addiction potential of Tramadol and hydrocodone. Tramadol Rx should last one month. Defers referral back to neurosurgeon at this time.  Return in about 1 month (around 03/25/2015). Call back sooner if no improvement.

## 2015-03-20 ENCOUNTER — Telehealth: Payer: Self-pay | Admitting: Nurse Practitioner

## 2015-03-20 NOTE — Telephone Encounter (Signed)
1 refill of each may be given it is important to keep the follow-up appointment

## 2015-03-20 NOTE — Telephone Encounter (Signed)
traMADol (ULTRAM) 50 MG tablet HYDROcodone-acetaminophen (NORCO/VICODIN) 5-325 MG per tablet methocarbamol (ROBAXIN) 750 MG tablet  Pt would like to have these meds refilled please, on the schedule to see Eber JonesCarolyn 4/27   Last seen 02/22/15

## 2015-03-21 ENCOUNTER — Other Ambulatory Visit: Payer: Self-pay | Admitting: *Deleted

## 2015-03-21 MED ORDER — TRAMADOL HCL 50 MG PO TABS: 50.0000 mg | ORAL_TABLET | Freq: Three times a day (TID) | ORAL | Status: DC | PRN

## 2015-03-21 MED ORDER — HYDROCODONE-ACETAMINOPHEN 5-325 MG PO TABS: 1.0000 | ORAL_TABLET | Freq: Four times a day (QID) | ORAL | Status: DC | PRN

## 2015-03-21 MED ORDER — METHOCARBAMOL 750 MG PO TABS: 750.0000 mg | ORAL_TABLET | Freq: Three times a day (TID) | ORAL | Status: DC | PRN

## 2015-03-21 NOTE — Telephone Encounter (Signed)
Scripts ready for pick up. Pt notified.  

## 2015-04-03 ENCOUNTER — Ambulatory Visit (INDEPENDENT_AMBULATORY_CARE_PROVIDER_SITE_OTHER): Payer: BLUE CROSS/BLUE SHIELD | Admitting: Nurse Practitioner

## 2015-04-03 ENCOUNTER — Encounter: Payer: Self-pay | Admitting: Nurse Practitioner

## 2015-04-03 VITALS — BP 102/80 | Ht 64.0 in | Wt 123.2 lb

## 2015-04-03 DIAGNOSIS — F418 Other specified anxiety disorders: Secondary | ICD-10-CM

## 2015-04-03 NOTE — Patient Instructions (Signed)
Antihistamine  nasacort or rhinocort  

## 2015-04-05 ENCOUNTER — Encounter: Payer: Self-pay | Admitting: Nurse Practitioner

## 2015-04-05 NOTE — Progress Notes (Signed)
Subjective:  Presents for routine follow-up of her depression and anxiety. Doing well overall with Celexa. Continues to have significant personal stress. Sleeping well. Has a son who is a senior this year. Gets preventive health physicals at [facility].  Objective:   BP 102/80 mmHg  Ht 5\' 4"  (1.626 m)  Wt 123 lb 3.2 oz (55.883 kg)  BMI 21.14 kg/m2 NAD. Alert, oriented. Cheerful affect. Mildly anxious affect. Thoughts logical coherent and relevant. Lungs clear. Heart regular rate rhythm.  Assessment:  Problem List Items Addressed This Visit    None    Visit Diagnoses    Depression with anxiety    -  Primary      Plan: Continue Celexa as directed. Discussed importance of stress reduction. Return in about 4 months (around 08/03/2015).

## 2015-04-19 ENCOUNTER — Telehealth: Payer: Self-pay | Admitting: Family Medicine

## 2015-04-19 ENCOUNTER — Other Ambulatory Visit: Payer: Self-pay | Admitting: Nurse Practitioner

## 2015-04-19 MED ORDER — TRAMADOL HCL 50 MG PO TABS: 50.0000 mg | ORAL_TABLET | Freq: Three times a day (TID) | ORAL | Status: DC | PRN

## 2015-04-19 MED ORDER — HYDROCODONE-ACETAMINOPHEN 5-325 MG PO TABS: 1.0000 | ORAL_TABLET | Freq: Four times a day (QID) | ORAL | Status: DC | PRN

## 2015-04-19 NOTE — Telephone Encounter (Signed)
Pt called stating that she needs refills on her hydrocodone and tramadol.

## 2015-04-19 NOTE — Telephone Encounter (Signed)
Scripts ready for pick up. Pt notified.  

## 2015-04-19 NOTE — Telephone Encounter (Signed)
Has appt with specialist for her back pain in June.

## 2015-04-19 NOTE — Telephone Encounter (Signed)
Will print one refill of each. Will need an office visit after this for reevaluation of her pain if she wants to continue.

## 2015-06-02 ENCOUNTER — Other Ambulatory Visit: Payer: Self-pay | Admitting: Nurse Practitioner

## 2015-06-17 ENCOUNTER — Ambulatory Visit: Payer: BLUE CROSS/BLUE SHIELD | Admitting: Adult Health

## 2015-06-19 ENCOUNTER — Ambulatory Visit (INDEPENDENT_AMBULATORY_CARE_PROVIDER_SITE_OTHER): Payer: Medicaid Other | Admitting: Adult Health

## 2015-06-19 ENCOUNTER — Encounter: Payer: Self-pay | Admitting: Adult Health

## 2015-06-19 VITALS — BP 102/50 | HR 88 | Ht 64.0 in | Wt 125.5 lb

## 2015-06-19 DIAGNOSIS — O3680X0 Pregnancy with inconclusive fetal viability, not applicable or unspecified: Secondary | ICD-10-CM

## 2015-06-19 DIAGNOSIS — Z3490 Encounter for supervision of normal pregnancy, unspecified, unspecified trimester: Secondary | ICD-10-CM

## 2015-06-19 DIAGNOSIS — N926 Irregular menstruation, unspecified: Secondary | ICD-10-CM | POA: Diagnosis not present

## 2015-06-19 DIAGNOSIS — Z3201 Encounter for pregnancy test, result positive: Secondary | ICD-10-CM | POA: Diagnosis not present

## 2015-06-19 HISTORY — DX: Encounter for supervision of normal pregnancy, unspecified, unspecified trimester: Z34.90

## 2015-06-19 LAB — POCT URINE PREGNANCY: PREG TEST UR: POSITIVE — AB

## 2015-06-19 NOTE — Progress Notes (Signed)
Subjective:     Patient ID: Pamela [name], female   DOB: 1981-11-16, 34 y.o.   MRN: [id number]  HPI Pamela [name] is a 34 year old white female, who missed a period and had +HPT and breast tenderness in for UPT.She is taking Prenatal vitamins already.  Review of Systems Patient denies any headaches, hearing loss, fatigue, blurred vision, shortness of breath, chest pain, abdominal pain, problems with bowel movements, urination, or intercourse. No joint pain or mood swings, no bleeding or nausea.+missed period, +breast tenderness Reviewed past medical,surgical, social and family history. Reviewed medications and allergies.     Objective:   Physical Exam BP 102/50 mmHg  Pulse 88  Ht 5\' 4"  (1.626 m)  Wt 125 lb 8 oz (56.926 kg)  BMI 21.53 kg/m2  LMP 05/08/2015 UPT + about 6 weeks by LMP with EDD 02/12/16, medicaid form given, Skin warm and dry.  Lungs: clear to ausculation bilaterally. Cardiovascular: regular rate and rhythm.    Assessment:    Pregnant      Plan:     Return in 1 week for dating US Review handout on first trimester

## 2015-06-19 NOTE — Patient Instructions (Signed)
First Trimester of Pregnancy The first trimester of pregnancy is from week 1 until the end of week 12 (months 1 through 3). A week after a sperm fertilizes an egg, the egg will implant on the wall of the uterus. This embryo will begin to develop into a baby. Genes from you and your partner are forming the baby. The female genes determine whether the baby is a boy or a girl. At 6-8 weeks, the eyes and face are formed, and the heartbeat can be seen on ultrasound. At the end of 12 weeks, all the baby's organs are formed.  Now that you are pregnant, you will want to do everything you can to have a healthy baby. Two of the most important things are to get good prenatal care and to follow your health care provider's instructions. Prenatal care is all the medical care you receive before the baby's birth. This care will help prevent, find, and treat any problems during the pregnancy and childbirth. BODY CHANGES Your body goes through many changes during pregnancy. The changes vary from woman to woman.   You may gain or lose a couple of pounds at first.  You may feel sick to your stomach (nauseous) and throw up (vomit). If the vomiting is uncontrollable, call your health care provider.  You may tire easily.  You may develop headaches that can be relieved by medicines approved by your health care provider.  You may urinate more often. Painful urination may mean you have a bladder infection.  You may develop heartburn as a result of your pregnancy.  You may develop constipation because certain hormones are causing the muscles that push waste through your intestines to slow down.  You may develop hemorrhoids or swollen, bulging veins (varicose veins).  Your breasts may begin to grow larger and become tender. Your nipples may stick out more, and the tissue that surrounds them (areola) may become darker.  Your gums may bleed and may be sensitive to brushing and flossing.  Dark spots or blotches (chloasma,  mask of pregnancy) may develop on your face. This will likely fade after the baby is born.  Your menstrual periods will stop.  You may have a loss of appetite.  You may develop cravings for certain kinds of food.  You may have changes in your emotions from day to day, such as being excited to be pregnant or being concerned that something may go wrong with the pregnancy and baby.  You may have more vivid and strange dreams.  You may have changes in your hair. These can include thickening of your hair, rapid growth, and changes in texture. Some women also have hair loss during or after pregnancy, or hair that feels dry or thin. Your hair will most likely return to normal after your baby is born. WHAT TO EXPECT AT YOUR PRENATAL VISITS During a routine prenatal visit:  You will be weighed to make sure you and the baby are growing normally.  Your blood pressure will be taken.  Your abdomen will be measured to track your baby's growth.  The fetal heartbeat will be listened to starting around week 10 or 12 of your pregnancy.  Test results from any previous visits will be discussed. Your health care provider may ask you:  How you are feeling.  If you are feeling the baby move.  If you have had any abnormal symptoms, such as leaking fluid, bleeding, severe headaches, or abdominal cramping.  If you have any questions. Other tests   that may be performed during your first trimester include:  Blood tests to find your blood type and to check for the presence of any previous infections. They will also be used to check for low iron levels (anemia) and Rh antibodies. Later in the pregnancy, blood tests for diabetes will be done along with other tests if problems develop.  Urine tests to check for infections, diabetes, or protein in the urine.  An ultrasound to confirm the proper growth and development of the baby.  An amniocentesis to check for possible genetic problems.  Fetal screens for  spina bifida and Down syndrome.  You may need other tests to make sure you and the baby are doing well. HOME CARE INSTRUCTIONS  Medicines  Follow your health care provider's instructions regarding medicine use. Specific medicines may be either safe or unsafe to take during pregnancy.  Take your prenatal vitamins as directed.  If you develop constipation, try taking a stool softener if your health care provider approves. Diet  Eat regular, well-balanced meals. Choose a variety of foods, such as meat or vegetable-based protein, fish, milk and low-fat dairy products, vegetables, fruits, and whole grain breads and cereals. Your health care provider will help you determine the amount of weight gain that is right for you.  Avoid raw meat and uncooked cheese. These carry germs that can cause birth defects in the baby.  Eating four or five small meals rather than three large meals a day may help relieve nausea and vomiting. If you start to feel nauseous, eating a few soda crackers can be helpful. Drinking liquids between meals instead of during meals also seems to help nausea and vomiting.  If you develop constipation, eat more high-fiber foods, such as fresh vegetables or fruit and whole grains. Drink enough fluids to keep your urine clear or pale yellow. Activity and Exercise  Exercise only as directed by your health care provider. Exercising will help you:  Control your weight.  Stay in shape.  Be prepared for labor and delivery.  Experiencing pain or cramping in the lower abdomen or low back is a good sign that you should stop exercising. Check with your health care provider before continuing normal exercises.  Try to avoid standing for long periods of time. Move your legs often if you must stand in one place for a long time.  Avoid heavy lifting.  Wear low-heeled shoes, and practice good posture.  You may continue to have sex unless your health care provider directs you  otherwise. Relief of Pain or Discomfort  Wear a good support bra for breast tenderness.   Take warm sitz baths to soothe any pain or discomfort caused by hemorrhoids. Use hemorrhoid cream if your health care provider approves.   Rest with your legs elevated if you have leg cramps or low back pain.  If you develop varicose veins in your legs, wear support hose. Elevate your feet for 15 minutes, 3-4 times a day. Limit salt in your diet. Prenatal Care  Schedule your prenatal visits by the twelfth week of pregnancy. They are usually scheduled monthly at first, then more often in the last 2 months before delivery.  Write down your questions. Take them to your prenatal visits.  Keep all your prenatal visits as directed by your health care provider. Safety  Wear your seat belt at all times when driving.  Make a list of emergency phone numbers, including numbers for family, friends, the hospital, and police and fire departments. General Tips    Ask your health care provider for a referral to a local prenatal education class. Begin classes no later than at the beginning of month 6 of your pregnancy.  Ask for help if you have counseling or nutritional needs during pregnancy. Your health care provider can offer advice or refer you to specialists for help with various needs.  Do not use hot tubs, steam rooms, or saunas.  Do not douche or use tampons or scented sanitary pads.  Do not cross your legs for long periods of time.  Avoid cat litter boxes and soil used by cats. These carry germs that can cause birth defects in the baby and possibly loss of the fetus by miscarriage or stillbirth.  Avoid all smoking, herbs, alcohol, and medicines not prescribed by your health care provider. Chemicals in these affect the formation and growth of the baby.  Schedule a dentist appointment. At home, brush your teeth with a soft toothbrush and be gentle when you floss. SEEK MEDICAL CARE IF:   You have  dizziness.  You have mild pelvic cramps, pelvic pressure, or nagging pain in the abdominal area.  You have persistent nausea, vomiting, or diarrhea.  You have a bad smelling vaginal discharge.  You have pain with urination.  You notice increased swelling in your face, hands, legs, or ankles. SEEK IMMEDIATE MEDICAL CARE IF:   You have a fever.  You are leaking fluid from your vagina.  You have spotting or bleeding from your vagina.  You have severe abdominal cramping or pain.  You have rapid weight gain or loss.  You vomit blood or material that looks like coffee grounds.  You are exposed to German measles and have never had them.  You are exposed to fifth disease or chickenpox.  You develop a severe headache.  You have shortness of breath.  You have any kind of trauma, such as from a fall or a car accident. Document Released: 11/17/2001 Document Revised: 04/09/2014 Document Reviewed: 10/03/2013 ExitCare Patient Information 2015 [employer]. This information is not intended to replace advice given to you by your health care provider. Make sure you discuss any questions you have with your health care provider. Follow up in 1 week for US  

## 2015-06-26 ENCOUNTER — Ambulatory Visit (INDEPENDENT_AMBULATORY_CARE_PROVIDER_SITE_OTHER): Payer: Medicaid Other

## 2015-06-26 DIAGNOSIS — O3680X0 Pregnancy with inconclusive fetal viability, not applicable or unspecified: Secondary | ICD-10-CM

## 2015-06-26 NOTE — Progress Notes (Signed)
US 6+2wks single IUP w/ys, pos fht 143bpm,normal ov's bilat,crl 6.163mm, edd 02/17/2016

## 2015-07-11 ENCOUNTER — Telehealth: Payer: Self-pay | Admitting: Advanced Practice Midwife

## 2015-07-11 ENCOUNTER — Other Ambulatory Visit: Payer: Self-pay | Admitting: Advanced Practice Midwife

## 2015-07-11 MED ORDER — PROMETHAZINE HCL 12.5 MG PO TABS: 12.5000 mg | ORAL_TABLET | Freq: Four times a day (QID) | ORAL | Status: DC | PRN

## 2015-07-11 NOTE — Telephone Encounter (Signed)
[facility] sent

## 2015-07-16 ENCOUNTER — Other Ambulatory Visit (HOSPITAL_COMMUNITY)
Admission: RE | Admit: 2015-07-16 | Discharge: 2015-07-16 | Disposition: A | Discharge status: Home / Self Care | Payer: Medicaid Other | Source: Ambulatory Visit | Attending: Obstetrics & Gynecology | Admitting: Obstetrics & Gynecology

## 2015-07-16 ENCOUNTER — Encounter: Payer: Self-pay | Admitting: Women's Health

## 2015-07-16 ENCOUNTER — Ambulatory Visit (INDEPENDENT_AMBULATORY_CARE_PROVIDER_SITE_OTHER): Payer: Medicaid Other | Admitting: Women's Health

## 2015-07-16 DIAGNOSIS — Z1151 Encounter for screening for human papillomavirus (HPV): Secondary | ICD-10-CM | POA: Diagnosis present

## 2015-07-16 DIAGNOSIS — O09529 Supervision of elderly multigravida, unspecified trimester: Secondary | ICD-10-CM | POA: Insufficient documentation

## 2015-07-16 DIAGNOSIS — Z1389 Encounter for screening for other disorder: Secondary | ICD-10-CM

## 2015-07-16 DIAGNOSIS — O34219 Maternal care for unspecified type scar from previous cesarean delivery: Secondary | ICD-10-CM | POA: Insufficient documentation

## 2015-07-16 DIAGNOSIS — Z01419 Encounter for gynecological examination (general) (routine) without abnormal findings: Secondary | ICD-10-CM | POA: Diagnosis present

## 2015-07-16 DIAGNOSIS — Z3491 Encounter for supervision of normal pregnancy, unspecified, first trimester: Secondary | ICD-10-CM

## 2015-07-16 DIAGNOSIS — Z124 Encounter for screening for malignant neoplasm of cervix: Secondary | ICD-10-CM

## 2015-07-16 DIAGNOSIS — Z113 Encounter for screening for infections with a predominantly sexual mode of transmission: Secondary | ICD-10-CM | POA: Insufficient documentation

## 2015-07-16 DIAGNOSIS — Z0283 Encounter for blood-alcohol and blood-drug test: Secondary | ICD-10-CM

## 2015-07-16 DIAGNOSIS — F17200 Nicotine dependence, unspecified, uncomplicated: Secondary | ICD-10-CM

## 2015-07-16 DIAGNOSIS — Z3682 Encounter for antenatal screening for nuchal translucency: Secondary | ICD-10-CM

## 2015-07-16 DIAGNOSIS — Z331 Pregnant state, incidental: Secondary | ICD-10-CM

## 2015-07-16 DIAGNOSIS — M5126 Other intervertebral disc displacement, lumbar region: Secondary | ICD-10-CM

## 2015-07-16 DIAGNOSIS — O09521 Supervision of elderly multigravida, first trimester: Secondary | ICD-10-CM

## 2015-07-16 DIAGNOSIS — Z369 Encounter for antenatal screening, unspecified: Secondary | ICD-10-CM

## 2015-07-16 DIAGNOSIS — O09899 Supervision of other high risk pregnancies, unspecified trimester: Secondary | ICD-10-CM | POA: Insufficient documentation

## 2015-07-16 LAB — POCT URINALYSIS DIPSTICK
Glucose, UA: NEGATIVE
Ketones, UA: NEGATIVE
LEUKOCYTES UA: NEGATIVE
Nitrite, UA: NEGATIVE
PROTEIN UA: NEGATIVE
RBC UA: NEGATIVE

## 2015-07-16 MED ORDER — DOXYLAMINE-PYRIDOXINE 10-10 MG PO TBEC: DELAYED_RELEASE_TABLET | ORAL | Status: DC

## 2015-07-16 NOTE — Patient Instructions (Signed)

## 2015-07-16 NOTE — Progress Notes (Signed)
Subjective:  Pamela [name] is a 34 y.o. [phone] Caucasian female at [redacted]w[redacted]d by 6wk u/s, being seen today for her first obstetrical visit.  Her obstetrical history is significant for smoker: 1ppd now down to 6 cigs/day- quit on her own w/ all other pregnancies, feels like she can do same w/ this one. Vag birth x 2, then c/s d/t 10lb baby- no dx of GDM, then repeat c/s. Smoked THC the other day d/t n/v, phenergan isn't helping. H/O herniated lumbar disc w/ surger 2013- locked up on her the other day- took vicodin to help. FOB has polycystic kidney dz, as does his brother, mother and MGM- who just recently passed away when she quit her dialysis. He has 3 other children and none of them have it, son is autistic.  Pregnancy history fully reviewed.  Patient reports n/v. Denies vb, cramping, uti s/s, abnormal/malodorous vag d/c, or vulvovaginal itching/irritation.  LMP 05/05/2015 (Exact Date)  HISTORY: OB History  Gravida Para Term Preterm AB SAB TAB Ectopic Multiple Living  8 4 4  3  3   4     # Outcome Date GA Lbr Len/2nd Weight Sex Delivery Anes PTL Lv  8 Current           7 Term 07/09/08 [redacted]w[redacted]d  7 lb 9 oz (3.43 kg) F CS-LTranv  N Y  6 Term 02/27/03 [redacted]w[redacted]d  10 lb (4.536 kg) F CS-LTranv  N Y  5 Term 04/22/00 [redacted]w[redacted]d  8 lb 9 oz (3.884 kg) M Vag-Spont EPI N Y  4 Term 05/17/96 [redacted]w[redacted]d  8 lb 11 oz (3.941 kg) M Vag-Spont EPI N Y  3 TAB           2 TAB           1 TAB              Past Medical History  Diagnosis Date  . Abnormal vaginal Pap smear   . Vaginal Pap smear, abnormal   . Migraines   . Pregnant 06/19/2015  . Depression    Past Surgical History  Procedure Laterality Date  . Cesarean section  x 2  . Back surgery    . Tonsillectomy and adenoidectomy     Family History  Problem Relation Age of Onset  . Dementia Paternal Grandmother   . Dementia Paternal Grandfather     Exam   System:     General: Well developed & nourished, no acute distress   Skin: Warm & dry, normal coloration  and turgor, no rashes   Neurologic: Alert & oriented, normal mood   Cardiovascular: Regular rate & rhythm   Respiratory: Effort & rate normal, LCTAB, acyanotic   Abdomen: Soft, non tender   Extremities: normal strength, tone   Pelvic Exam:    Perineum: Normal perineum   Vulva: Normal, no lesions   Vagina:  Normal mucosa, normal discharge   Cervix: Normal, bulbous, appears closed   Uterus: Normal size/shape/contour for GA   Thin prep pap smear collected w/  high risk HPV cotesting FHR: 172 via doppler   Assessment:   Pregnancy: [id number] Patient Active Problem List   Diagnosis Date Noted  . Supervision of normal pregnancy 07/16/2015    Priority: High  . AMA (advanced maternal age) multigravida 35+ 07/16/2015  . Previous cesarean delivery, antepartum 07/16/2015  . Lumbar herniated disc 07/16/2015  . Rotator cuff syndrome of left shoulder 07/30/2011  . Shoulder subluxation, left 07/30/2011    [redacted]w[redacted]d [id number] New OB visit Smoker  THC H/o C/S X2 FOB w/ PKD AMA, will turn 34 at end of month  Plan:  Initial labs drawn Continue prenatal vitamins Problem list reviewed and updated Reviewed n/v relief measures and warning s/s to report Rx diclegis, prior auth approved through [employer] today Reviewed recommended weight gain based on pre-gravid BMI Encouraged well-balanced diet Genetic Screening discussed Integrated Screen: requested Cystic fibrosis screening discussed requested Ultrasound discussed; fetal survey: requested Follow up in 3 weeks for 1st it/nt and visit [facility] completed Smokes 6cigs/day, advised cessation, discussed risks to fetus while pregnant, to infant pp, and to herself. Offered QuitlineNC, declined Discussed VBAC, undecided- will take consent home to review   Marge Duncans CNM, [facility] 07/16/2015 3:41 PM

## 2015-07-17 ENCOUNTER — Encounter: Payer: Self-pay | Admitting: Women's Health

## 2015-07-17 DIAGNOSIS — Z283 Underimmunization status: Secondary | ICD-10-CM

## 2015-07-17 DIAGNOSIS — O09899 Supervision of other high risk pregnancies, unspecified trimester: Secondary | ICD-10-CM | POA: Insufficient documentation

## 2015-07-17 DIAGNOSIS — Z2839 Other underimmunization status: Secondary | ICD-10-CM | POA: Insufficient documentation

## 2015-07-18 LAB — CBC
HEMATOCRIT: 35 % (ref 34.0–46.6)
HEMOGLOBIN: 12.1 g/dL (ref 11.1–15.9)
MCH: 31.8 pg (ref 26.6–33.0)
MCHC: 34.6 g/dL (ref 31.5–35.7)
MCV: 92 fL (ref 79–97)
Platelets: 301 10*3/uL (ref 150–379)
RBC: 3.81 x10E6/uL (ref 3.77–5.28)
RDW: 12.9 % (ref 12.3–15.4)
WBC: 8.3 10*3/uL (ref 3.4–10.8)

## 2015-07-18 LAB — URINALYSIS, ROUTINE W REFLEX MICROSCOPIC
Bilirubin, UA: NEGATIVE
KETONES UA: NEGATIVE
LEUKOCYTES UA: NEGATIVE
Nitrite, UA: NEGATIVE
RBC, UA: NEGATIVE
Urobilinogen, Ur: 0.2 mg/dL (ref 0.2–1.0)
pH, UA: 5.5 (ref 5.0–7.5)

## 2015-07-18 LAB — VARICELLA ZOSTER ANTIBODY, IGG: Varicella zoster IgG: <135 index — ABNORMAL LOW (ref >165)

## 2015-07-18 LAB — PMP SCREEN PROFILE (10S), URINE

## 2015-07-18 LAB — RUBELLA SCREEN: RUBELLA: 1.43 {index} (ref >0.99)

## 2015-07-18 LAB — URINE CULTURE

## 2015-07-18 LAB — ABO/RH: Rh Factor: POSITIVE

## 2015-07-18 LAB — HIV ANTIBODY (ROUTINE TESTING W REFLEX): HIV SCREEN 4TH GENERATION: NONREACTIVE

## 2015-07-18 LAB — HEPATITIS B SURFACE ANTIGEN: Hepatitis B Surface Ag: NEGATIVE

## 2015-07-18 LAB — CYTOLOGY - PAP

## 2015-07-18 LAB — RPR: RPR: NONREACTIVE

## 2015-07-18 LAB — ANTIBODY SCREEN: Antibody Screen: NEGATIVE

## 2015-07-23 ENCOUNTER — Telehealth: Payer: Self-pay | Admitting: Family Medicine

## 2015-07-23 NOTE — Telephone Encounter (Signed)
Pt is requesting a refill on her tramadol.  

## 2015-07-23 NOTE — Telephone Encounter (Signed)
Last seen 04/03/15, System states that patient is pregnant

## 2015-07-23 NOTE — Telephone Encounter (Signed)
Cannot take tramadol with pregnancy. It is purely up to the gynecologist to recommend what they feel is safe. Typically Tylenol is

## 2015-07-23 NOTE — Telephone Encounter (Signed)
Notified patient cannot take tramadol with pregnancy. It is purely up to the gynecologist to recommend what they feel is safe. Typically Tylenol is. Patient verbalized understanding.

## 2015-07-26 ENCOUNTER — Telehealth: Payer: Self-pay | Admitting: *Deleted

## 2015-07-26 NOTE — Telephone Encounter (Signed)
Mailbox full @ 1:45 pm. JSY

## 2015-07-26 NOTE — Telephone Encounter (Signed)
Pt state pain in back due to history of ruptured disk. Pt requesting Rx for Tramadol for pain. Per Cyril Mourning, NP, does not recommend Tramadol for pain during pregnancy. Pt states Tylenol does not help. Pt states spoke Joellyn Haff, CNM at last appt and informed Selena Batten she taking the Hydrocodone and Tramadol for ruptured disk. Pt states Kim told her to take the Tramadol as a last resort.

## 2015-07-29 NOTE — Telephone Encounter (Signed)
Pt informed per Joellyn Haff, CNM will need to discuss RX for Tramadol for back pain with Dr. Despina Hidden at her 08/06/2015 appt. Pt verbalized understanding.

## 2015-08-06 ENCOUNTER — Telehealth: Payer: Self-pay | Admitting: *Deleted

## 2015-08-06 ENCOUNTER — Ambulatory Visit (INDEPENDENT_AMBULATORY_CARE_PROVIDER_SITE_OTHER): Payer: Medicaid Other

## 2015-08-06 ENCOUNTER — Encounter: Payer: Self-pay | Admitting: Obstetrics & Gynecology

## 2015-08-06 ENCOUNTER — Ambulatory Visit (INDEPENDENT_AMBULATORY_CARE_PROVIDER_SITE_OTHER): Payer: Medicaid Other | Admitting: Obstetrics & Gynecology

## 2015-08-06 VITALS — BP 100/60 | HR 80 | Wt 121.4 lb

## 2015-08-06 DIAGNOSIS — F32A Depression, unspecified: Secondary | ICD-10-CM

## 2015-08-06 DIAGNOSIS — Z3682 Encounter for antenatal screening for nuchal translucency: Secondary | ICD-10-CM

## 2015-08-06 DIAGNOSIS — Z331 Pregnant state, incidental: Secondary | ICD-10-CM

## 2015-08-06 DIAGNOSIS — Z1389 Encounter for screening for other disorder: Secondary | ICD-10-CM

## 2015-08-06 DIAGNOSIS — F329 Major depressive disorder, single episode, unspecified: Secondary | ICD-10-CM

## 2015-08-06 DIAGNOSIS — Z3491 Encounter for supervision of normal pregnancy, unspecified, first trimester: Secondary | ICD-10-CM

## 2015-08-06 DIAGNOSIS — Z36 Encounter for antenatal screening of mother: Secondary | ICD-10-CM

## 2015-08-06 DIAGNOSIS — Z369 Encounter for antenatal screening, unspecified: Secondary | ICD-10-CM

## 2015-08-06 DIAGNOSIS — M5432 Sciatica, left side: Secondary | ICD-10-CM

## 2015-08-06 DIAGNOSIS — M5430 Sciatica, unspecified side: Secondary | ICD-10-CM | POA: Insufficient documentation

## 2015-08-06 DIAGNOSIS — O09521 Supervision of elderly multigravida, first trimester: Secondary | ICD-10-CM

## 2015-08-06 LAB — POCT URINALYSIS DIPSTICK
Blood, UA: NEGATIVE
GLUCOSE UA: NEGATIVE
Ketones, UA: NEGATIVE
LEUKOCYTES UA: NEGATIVE
Nitrite, UA: NEGATIVE
Protein, UA: NEGATIVE

## 2015-08-06 MED ORDER — HYDROCODONE-ACETAMINOPHEN 5-500 MG PO TABS
1.0000 | ORAL_TABLET | Freq: Four times a day (QID) | ORAL | Status: DC | PRN
Start: 2015-08-06 — End: 2015-08-29

## 2015-08-06 MED ORDER — CITALOPRAM HYDROBROMIDE 10 MG PO TABS: 10.0000 mg | ORAL_TABLET | Freq: Every day | ORAL | Status: DC

## 2015-08-06 NOTE — Progress Notes (Signed)
[id number] [redacted]w[redacted]d Estimated Date of Delivery: 02/17/16  Blood pressure 100/60, pulse 80, weight 121 lb 6.4 oz (55.067 kg), last menstrual period 05/05/2015.   BP weight and urine results all reviewed and noted.  Please refer to the obstetrical flow sheet for the fundal height and fetal heart rate documentation:  Patient reports good fetal movement, denies any bleeding and no rupture of membranes symptoms or regular contractions. Patient is without complaints. All questions were answered.  Orders Placed This Encounter  Procedures  . Maternal Screen, Integrated #1  . POCT urinalysis dipstick    Plan:  Continued routine obstetrical care,   Return in about 4 weeks (around 09/03/2015) for LROB, 2nd IT.   Pt given #20 hydrocodone, has had back surgery in 2010 and has episodic sciatica which requires occasional hydrocodone Encouraged to use the flexeril she already has which works as initial therapy management Refilled her celexa to continue during the pregnancy  Meds ordered this encounter  Medications  . citalopram (CELEXA) 10 MG tablet    Sig: Take 1 tablet (10 mg total) by mouth daily.    Dispense:  30 tablet    Refill:  3  . HYDROcodone-acetaminophen (VICODIN) 5-500 MG per tablet    Sig: Take 1 tablet by mouth every 6 (six) hours as needed for pain.    Dispense:  20 tablet    Refill:  0

## 2015-08-06 NOTE — Telephone Encounter (Signed)
[facility] pharmacist called stating no longer able to get Hydrocodone 5/500 mg please clarify order with Dr. Despina Hidden. Dr. Despina Hidden clarified order stating Hydrocodone 5/325 mg 1 tablet by mouth every 6 hours as needed for pain, #20, no refills.

## 2015-08-06 NOTE — Progress Notes (Signed)
[place] 12+1wks,single IUP pos fht 163bpm,normal ov's bilat,NB present,NT 1.51mm,crl 60.66mm

## 2015-08-08 LAB — MATERNAL SCREEN, INTEGRATED #1
Crown Rump Length: 60.9 mm
Gest. Age on Collection Date: 12.4 weeks
Maternal Age at EDD: 35.5 years
NUCHAL TRANSLUCENCY (NT): 1.3 mm
Number of Fetuses: 1
PAPP-A Value: 1404.7 ng/mL
WEIGHT: 121 [lb_av]

## 2015-08-29 ENCOUNTER — Ambulatory Visit (INDEPENDENT_AMBULATORY_CARE_PROVIDER_SITE_OTHER): Payer: Medicaid Other | Admitting: Advanced Practice Midwife

## 2015-08-29 VITALS — BP 102/48 | HR 112 | Wt 123.0 lb

## 2015-08-29 DIAGNOSIS — Z0283 Encounter for blood-alcohol and blood-drug test: Secondary | ICD-10-CM

## 2015-08-29 DIAGNOSIS — Z3492 Encounter for supervision of normal pregnancy, unspecified, second trimester: Secondary | ICD-10-CM

## 2015-08-29 DIAGNOSIS — Z0189 Encounter for other specified special examinations: Secondary | ICD-10-CM

## 2015-08-29 DIAGNOSIS — Z1389 Encounter for screening for other disorder: Secondary | ICD-10-CM

## 2015-08-29 DIAGNOSIS — M5432 Sciatica, left side: Secondary | ICD-10-CM

## 2015-08-29 DIAGNOSIS — Z331 Pregnant state, incidental: Secondary | ICD-10-CM

## 2015-08-29 LAB — POCT URINALYSIS DIPSTICK
GLUCOSE UA: NEGATIVE
Leukocytes, UA: NEGATIVE
NITRITE UA: NEGATIVE
Protein, UA: NEGATIVE
RBC UA: NEGATIVE

## 2015-08-29 MED ORDER — HYDROCODONE-ACETAMINOPHEN 5-500 MG PO TABS: 1.0000 | ORAL_TABLET | Freq: Four times a day (QID) | ORAL | Status: DC | PRN

## 2015-08-29 MED ORDER — PNV PRENATAL PLUS MULTIVITAMIN 27-1 MG PO TABS: 1.0000 | ORAL_TABLET | Freq: Every day | ORAL | Status: DC

## 2015-08-29 NOTE — Patient Instructions (Signed)

## 2015-08-29 NOTE — Progress Notes (Signed)
[id number] [redacted]w[redacted]d Estimated Date of Delivery: 02/17/16  Blood pressure 102/48, pulse 112, weight 123 lb (55.792 kg), last menstrual period 05/05/2015.    WORK IN for cold sx for 3 days.  Requests refill on PNV and hydrocodone.  Had rx form 8/30 for # 20-had back surgery and takes on occasions.  [place] registry shows last Rx 5 months ago.  Discussed with [facility].  OK to rx small amounts monthly if needed. LCTAB.  BP weight and urine results all reviewed and noted.  Please refer to the obstetrical flow sheet for the fundal height and fetal heart rate documentation:  Patient denies any bleeding and no rupture of membranes symptoms or regular contractions. Patient is without complaints. All questions were answered.  Orders Placed This Encounter  Procedures  . Pain Management Screening Profile (10S)  . POCT urinalysis dipstick    Plan:  Continued routine obstetrical care, symptomatic tx for URI CALL us if not getting better on Monday for antibiotics to be called in.   Return in about 4 weeks (around 09/26/2015) for AV:[id number], LROB.

## 2015-08-30 LAB — PMP SCREEN PROFILE (10S), URINE
AMPHETAMINE SCRN UR: NEGATIVE ng/mL
BARBITURATE SCRN UR: NEGATIVE ng/mL
Benzodiazepine Screen, Urine: NEGATIVE ng/mL
COCAINE(METAB.) SCREEN, URINE: NEGATIVE ng/mL
Cannabinoids Ur Ql Scn: NEGATIVE ng/mL
Creatinine(Crt), U: 150.4 mg/dL (ref 20.0–300.0)
METHADONE SCREEN, URINE: NEGATIVE ng/mL
Opiate Scrn, Ur: POSITIVE ng/mL
Oxycodone+Oxymorphone Ur Ql Scn: NEGATIVE ng/mL
PCP SCRN UR: NEGATIVE ng/mL
PH UR, DRUG SCRN: 6.6 (ref 4.5–8.9)
PROPOXYPHENE SCREEN: NEGATIVE ng/mL

## 2015-09-03 ENCOUNTER — Encounter: Payer: Medicaid Other | Admitting: Women's Health

## 2015-09-19 ENCOUNTER — Other Ambulatory Visit: Payer: Self-pay | Admitting: Advanced Practice Midwife

## 2015-09-19 DIAGNOSIS — Z1389 Encounter for screening for other disorder: Secondary | ICD-10-CM

## 2015-09-24 ENCOUNTER — Ambulatory Visit (INDEPENDENT_AMBULATORY_CARE_PROVIDER_SITE_OTHER): Payer: Medicaid Other

## 2015-09-24 ENCOUNTER — Ambulatory Visit (INDEPENDENT_AMBULATORY_CARE_PROVIDER_SITE_OTHER): Payer: Medicaid Other | Admitting: Obstetrics & Gynecology

## 2015-09-24 VITALS — BP 100/50 | HR 76 | Wt 121.5 lb

## 2015-09-24 DIAGNOSIS — Z23 Encounter for immunization: Secondary | ICD-10-CM

## 2015-09-24 DIAGNOSIS — Z331 Pregnant state, incidental: Secondary | ICD-10-CM

## 2015-09-24 DIAGNOSIS — Z36 Encounter for antenatal screening of mother: Secondary | ICD-10-CM | POA: Diagnosis not present

## 2015-09-24 DIAGNOSIS — Z3491 Encounter for supervision of normal pregnancy, unspecified, first trimester: Secondary | ICD-10-CM

## 2015-09-24 DIAGNOSIS — Z1389 Encounter for screening for other disorder: Secondary | ICD-10-CM

## 2015-09-24 DIAGNOSIS — Z3A14 14 weeks gestation of pregnancy: Secondary | ICD-10-CM

## 2015-09-24 DIAGNOSIS — Z3492 Encounter for supervision of normal pregnancy, unspecified, second trimester: Secondary | ICD-10-CM

## 2015-09-24 LAB — POCT URINALYSIS DIPSTICK
GLUCOSE UA: NEGATIVE
Ketones, UA: NEGATIVE
Leukocytes, UA: NEGATIVE
Nitrite, UA: NEGATIVE
PROTEIN UA: NEGATIVE
RBC UA: NEGATIVE

## 2015-09-24 NOTE — Progress Notes (Signed)
[place] 19+1wks,measurement c/w dates,EFW 303g,normal ov's bilat,cephalic,post pl gr 0,cx 4.1cm,svp of fluid 5.3cm,fhr 161 bpm,anatomy complete no obvious abn seen

## 2015-09-24 NOTE — Progress Notes (Signed)
[id number] 6036w1d Estimated Date of Delivery: 02/17/16  Blood pressure 100/50, pulse 76, weight 121 lb 8 oz (55.112 kg), last menstrual period 05/05/2015.   BP weight and urine results all reviewed and noted.  Please refer to the obstetrical flow sheet for the fundal height and fetal heart rate documentation:  Patient reports good fetal movement, denies any bleeding and no rupture of membranes symptoms or regular contractions. Patient is without complaints. All questions were answered.  Orders Placed This Encounter  Procedures  . Flu Vaccine QUAD 36+ mos IM  . POCT urinalysis dipstick    Plan:  Continued routine obstetrical care, sonogram  No Follow-up on file.

## 2015-09-25 ENCOUNTER — Telehealth: Payer: Self-pay | Admitting: Obstetrics & Gynecology

## 2015-09-25 NOTE — Telephone Encounter (Signed)
Pt saw Dr. Despina HiddenEure 09/24/2015 and forgot to ask for refill for Hydrocodone and Celexa. Please advise.

## 2015-09-30 ENCOUNTER — Telehealth: Payer: Self-pay | Admitting: *Deleted

## 2015-09-30 NOTE — Telephone Encounter (Signed)
Pt requesting refill on Hydrocodone and Celexa.

## 2015-10-01 ENCOUNTER — Telehealth: Payer: Self-pay | Admitting: Obstetrics & Gynecology

## 2015-10-01 DIAGNOSIS — M5432 Sciatica, left side: Secondary | ICD-10-CM

## 2015-10-01 DIAGNOSIS — F329 Major depressive disorder, single episode, unspecified: Secondary | ICD-10-CM

## 2015-10-01 DIAGNOSIS — F32A Depression, unspecified: Secondary | ICD-10-CM

## 2015-10-01 MED ORDER — CITALOPRAM HYDROBROMIDE 10 MG PO TABS: 10.0000 mg | ORAL_TABLET | Freq: Every day | ORAL | Status: DC

## 2015-10-01 MED ORDER — HYDROCODONE-ACETAMINOPHEN 5-500 MG PO TABS: 1.0000 | ORAL_TABLET | Freq: Four times a day (QID) | ORAL | Status: DC | PRN

## 2015-10-02 NOTE — Telephone Encounter (Signed)
Pt informed Hydrocodone RX at front desk for pick up, Celexa e-scribed to pt pharmacy.

## 2015-10-22 ENCOUNTER — Ambulatory Visit (INDEPENDENT_AMBULATORY_CARE_PROVIDER_SITE_OTHER): Payer: Medicaid Other | Admitting: Women's Health

## 2015-10-22 ENCOUNTER — Encounter: Payer: Self-pay | Admitting: Women's Health

## 2015-10-22 VITALS — BP 98/56 | HR 96 | Wt 124.0 lb

## 2015-10-22 DIAGNOSIS — F32A Depression, unspecified: Secondary | ICD-10-CM

## 2015-10-22 DIAGNOSIS — Z1389 Encounter for screening for other disorder: Secondary | ICD-10-CM

## 2015-10-22 DIAGNOSIS — F329 Major depressive disorder, single episode, unspecified: Secondary | ICD-10-CM

## 2015-10-22 DIAGNOSIS — M545 Low back pain, unspecified: Secondary | ICD-10-CM

## 2015-10-22 DIAGNOSIS — G8929 Other chronic pain: Secondary | ICD-10-CM | POA: Insufficient documentation

## 2015-10-22 DIAGNOSIS — Z331 Pregnant state, incidental: Secondary | ICD-10-CM

## 2015-10-22 DIAGNOSIS — Z3492 Encounter for supervision of normal pregnancy, unspecified, second trimester: Secondary | ICD-10-CM

## 2015-10-22 LAB — POCT URINALYSIS DIPSTICK
GLUCOSE UA: NEGATIVE
KETONES UA: NEGATIVE
NITRITE UA: NEGATIVE
Protein, UA: NEGATIVE
RBC UA: NEGATIVE

## 2015-10-22 NOTE — Progress Notes (Signed)
Low-risk OB appointment [id number] [id number] Estimated Date of Delivery: 02/17/16 BP 98/56 mmHg  Pulse 96  Wt 124 lb (56.246 kg)  LMP 05/05/2015 (Exact Date)  BP, weight, and urine reviewed.  Refer to obstetrical flow sheet for FH & FHR.  Reports good fm.  Denies regular uc's, lof, vb, or uti s/s. Chronic back pain and Rt sciatica- discussed relief measures, has been getting 20 hydrocodone q mth for LBP/had back surgery 2010- requesting refill today- last filled 2wks ago, too early to fill today Reviewed ptl s/s, fm. Plan:  Continue routine obstetrical care  F/U in 4wks for OB appointment and PN2

## 2015-10-22 NOTE — Patient Instructions (Signed)
You will have your sugar test next visit.  Please do not eat or drink anything after midnight the night before you come, not even water.  You will be here for at least two hours.     Call the office [phone]) or [facility] if:  You begin to have strong, frequent contractions  Your water breaks.  Sometimes it is a big gush of fluid, sometimes it is just a trickle that keeps getting your panties wet or running down your legs  You have vaginal bleeding.  It is normal to have a small amount of spotting if your cervix was checked.   You don't feel your baby moving like normal.  If you don't, get you something to eat and drink and lay down and focus on feeling your baby move.   If your baby is still not moving like normal, you should call the office or go to [facility].  For your lower back pain you may:  Purchase a pregnancy belt from [employer], [employer], [employer], etc and wear it while you are up and about  Take warm baths  Use a heating pad to your lower back for no longer than 20 minutes at a time, and do not place near abdomen  Take tylenol as needed. Please follow directions on the bottle   Second Trimester of Pregnancy The second trimester is from week 13 through week 28, months 4 through 6. The second trimester is often a time when you feel your best. Your body has also adjusted to being pregnant, and you begin to feel better physically. Usually, morning sickness has lessened or quit completely, you may have more energy, and you may have an increase in appetite. The second trimester is also a time when the fetus is growing rapidly. At the end of the sixth month, the fetus is about 9 inches long and weighs about 1 pounds. You will likely begin to feel the baby move (quickening) between 18 and 20 weeks of the pregnancy. BODY CHANGES Your body goes through many changes during pregnancy. The changes vary from woman to woman.   Your weight will continue to  increase. You will notice your lower abdomen bulging out.  You may begin to get stretch marks on your hips, abdomen, and breasts.  You may develop headaches that can be relieved by medicines approved by your health care provider.  You may urinate more often because the fetus is pressing on your bladder.  You may develop or continue to have heartburn as a result of your pregnancy.  You may develop constipation because certain hormones are causing the muscles that push waste through your intestines to slow down.  You may develop hemorrhoids or swollen, bulging veins (varicose veins).  You may have back pain because of the weight gain and pregnancy hormones relaxing your joints between the bones in your pelvis and as a result of a shift in weight and the muscles that support your balance.  Your breasts will continue to grow and be tender.  Your gums may bleed and may be sensitive to brushing and flossing.  Dark spots or blotches (chloasma, mask of pregnancy) may develop on your face. This will likely fade after the baby is born.  A dark line from your belly button to the pubic area (linea nigra) may appear. This will likely fade after the baby is born.  You may have changes in your hair. These can include thickening of your hair, rapid  growth, and changes in texture. Some women also have hair loss during or after pregnancy, or hair that feels dry or thin. Your hair will most likely return to normal after your baby is born. WHAT TO EXPECT AT YOUR PRENATAL VISITS During a routine prenatal visit:  You will be weighed to make sure you and the fetus are growing normally.  Your blood pressure will be taken.  Your abdomen will be measured to track your baby's growth.  The fetal heartbeat will be listened to.  Any test results from the previous visit will be discussed. Your health care provider may ask you:  How you are feeling.  If you are feeling the baby move.  If you have had any  abnormal symptoms, such as leaking fluid, bleeding, severe headaches, or abdominal cramping.  If you have any questions. Other tests that may be performed during your second trimester include:  Blood tests that check for:  Low iron levels (anemia).  Gestational diabetes (between 24 and 28 weeks).  Rh antibodies.  Urine tests to check for infections, diabetes, or protein in the urine.  An ultrasound to confirm the proper growth and development of the baby.  An amniocentesis to check for possible genetic problems.  Fetal screens for spina bifida and Down syndrome. HOME CARE INSTRUCTIONS  5. Avoid all smoking, herbs, alcohol, and unprescribed drugs. These chemicals affect the formation and growth of the baby. 6. Follow your health care provider's instructions regarding medicine use. There are medicines that are either safe or unsafe to take during pregnancy. 7. Exercise only as directed by your health care provider. Experiencing uterine cramps is a good sign to stop exercising. 8. Continue to eat regular, healthy meals. 9. Wear a good support bra for breast tenderness. 10. Do not use hot tubs, steam rooms, or saunas. 11. Wear your seat belt at all times when driving. 12. Avoid raw meat, uncooked cheese, cat litter boxes, and soil used by cats. These carry germs that can cause birth defects in the baby. 13. Take your prenatal vitamins. 14. Try taking a stool softener (if your health care provider approves) if you develop constipation. Eat more high-fiber foods, such as fresh vegetables or fruit and whole grains. Drink plenty of fluids to keep your urine clear or pale yellow. 15. Take warm sitz baths to soothe any pain or discomfort caused by hemorrhoids. Use hemorrhoid cream if your health care provider approves. 16. If you develop varicose veins, wear support hose. Elevate your feet for 15 minutes, 3-4 times a day. Limit salt in your diet. 17. Avoid heavy lifting, wear low heel shoes,  and practice good posture. 18. Rest with your legs elevated if you have leg cramps or low back pain. 19. Visit your dentist if you have not gone yet during your pregnancy. Use a soft toothbrush to brush your teeth and be gentle when you floss. 20. A sexual relationship may be continued unless your health care provider directs you otherwise. 21. Continue to go to all your prenatal visits as directed by your health care provider. SEEK MEDICAL CARE IF:   You have dizziness.  You have mild pelvic cramps, pelvic pressure, or nagging pain in the abdominal area.  You have persistent nausea, vomiting, or diarrhea.  You have a bad smelling vaginal discharge.  You have pain with urination. SEEK IMMEDIATE MEDICAL CARE IF:   You have a fever.  You are leaking fluid from your vagina.  You have spotting or bleeding from your   vagina.  You have severe abdominal cramping or pain.  You have rapid weight gain or loss.  You have shortness of breath with chest pain.  You notice sudden or extreme swelling of your face, hands, ankles, feet, or legs.  You have not felt your baby move in over an hour.  You have severe headaches that do not go away with medicine.  You have vision changes. Document Released: 11/17/2001 Document Revised: 11/28/2013 Document Reviewed: 01/24/2013 [facility] Patient Information 2015 [place], [place]. This information is not intended to replace advice given to you by your health care provider. Make sure you discuss any questions you have with your health care provider.

## 2015-10-30 ENCOUNTER — Telehealth: Payer: Self-pay | Admitting: Obstetrics & Gynecology

## 2015-10-30 MED ORDER — HYDROCODONE-ACETAMINOPHEN 5-325 MG PO TABS: 1.0000 | ORAL_TABLET | Freq: Four times a day (QID) | ORAL | Status: DC | PRN

## 2015-10-30 NOTE — Telephone Encounter (Signed)
Pt requesting refill for Hydrocodone for chronic back pain.

## 2015-10-30 NOTE — Telephone Encounter (Signed)
Spoke with pt letting her know she can only have 20 per 30 days and will need to come pick up at office. Pt to try and come by before 5. JSY

## 2015-10-30 NOTE — Telephone Encounter (Signed)
Tried to call pt but mailbox was full. Unable to leave message. JSY

## 2015-11-19 ENCOUNTER — Other Ambulatory Visit: Payer: Medicaid Other

## 2015-11-19 ENCOUNTER — Encounter: Payer: Self-pay | Admitting: Women's Health

## 2015-11-19 ENCOUNTER — Ambulatory Visit (INDEPENDENT_AMBULATORY_CARE_PROVIDER_SITE_OTHER): Payer: Medicaid Other | Admitting: Women's Health

## 2015-11-19 VITALS — BP 116/54 | HR 92 | Wt 130.0 lb

## 2015-11-19 DIAGNOSIS — O99312 Alcohol use complicating pregnancy, second trimester: Secondary | ICD-10-CM

## 2015-11-19 DIAGNOSIS — O09522 Supervision of elderly multigravida, second trimester: Secondary | ICD-10-CM

## 2015-11-19 DIAGNOSIS — O09892 Supervision of other high risk pregnancies, second trimester: Secondary | ICD-10-CM

## 2015-11-19 DIAGNOSIS — Z79891 Long term (current) use of opiate analgesic: Secondary | ICD-10-CM

## 2015-11-19 DIAGNOSIS — Z131 Encounter for screening for diabetes mellitus: Secondary | ICD-10-CM

## 2015-11-19 DIAGNOSIS — Z331 Pregnant state, incidental: Secondary | ICD-10-CM

## 2015-11-19 DIAGNOSIS — Z369 Encounter for antenatal screening, unspecified: Secondary | ICD-10-CM

## 2015-11-19 DIAGNOSIS — Z1389 Encounter for screening for other disorder: Secondary | ICD-10-CM

## 2015-11-19 DIAGNOSIS — F1290 Cannabis use, unspecified, uncomplicated: Secondary | ICD-10-CM

## 2015-11-19 DIAGNOSIS — F1210 Cannabis abuse, uncomplicated: Secondary | ICD-10-CM

## 2015-11-19 DIAGNOSIS — O99310 Alcohol use complicating pregnancy, unspecified trimester: Secondary | ICD-10-CM | POA: Insufficient documentation

## 2015-11-19 LAB — POCT URINALYSIS DIPSTICK
Glucose, UA: NEGATIVE
KETONES UA: NEGATIVE
Leukocytes, UA: NEGATIVE
Nitrite, UA: NEGATIVE
PROTEIN UA: NEGATIVE

## 2015-11-19 NOTE — Patient Instructions (Signed)
Call the office ([phone]) or go to [facility] if:  You begin to have strong, frequent contractions  Your water breaks.  Sometimes it is a big gush of fluid, sometimes it is just a trickle that keeps getting your panties wet or running down your legs  You have vaginal bleeding.  It is normal to have a small amount of spotting if your cervix was checked.   You don't feel your baby moving like normal.  If you don't, get you something to eat and drink and lay down and focus on feeling your baby move.  You should feel at least 10 movements in 2 hours.  If you don't, you should call the office or go to [facility].    Tdap Vaccine  It is recommended that you get the Tdap vaccine during the third trimester of EACH pregnancy to help protect your baby from getting pertussis (whooping cough)  27-36 weeks is the BEST time to do this so that you can pass the protection on to your baby. During pregnancy is better than after pregnancy, but if you are unable to get it during pregnancy it will be offered at the hospital.   You can get this vaccine at the health department or your family doctor  Everyone who will be around your baby should also be up-to-date on their vaccines. Adults (who are not pregnant) only need 1 dose of Tdap during adulthood.   Third Trimester of Pregnancy The third trimester is from week 29 through week 42, months 7 through 9. The third trimester is a time when the fetus is growing rapidly. At the end of the ninth month, the fetus is about 20 inches in length and weighs 6-10 pounds.  BODY CHANGES Your body goes through many changes during pregnancy. The changes vary from woman to woman.   Your weight will continue to increase. You can expect to gain 25-35 pounds (11-16 kg) by the end of the pregnancy.  You may begin to get stretch marks on your hips, abdomen, and breasts.  You may urinate more often because the fetus is moving lower into your pelvis and pressing on  your bladder.  You may develop or continue to have heartburn as a result of your pregnancy.  You may develop constipation because certain hormones are causing the muscles that push waste through your intestines to slow down.  You may develop hemorrhoids or swollen, bulging veins (varicose veins).  You may have pelvic pain because of the weight gain and pregnancy hormones relaxing your joints between the bones in your pelvis. Backaches may result from overexertion of the muscles supporting your posture.  You may have changes in your hair. These can include thickening of your hair, rapid growth, and changes in texture. Some women also have hair loss during or after pregnancy, or hair that feels dry or thin. Your hair will most likely return to normal after your baby is born.  Your breasts will continue to grow and be tender. A yellow discharge may leak from your breasts called colostrum.  Your belly button may stick out.  You may feel short of breath because of your expanding uterus.  You may notice the fetus "dropping," or moving lower in your abdomen.  You may have a bloody mucus discharge. This usually occurs a few days to a week before labor begins.  Your cervix becomes thin and soft (effaced) near your due date. WHAT TO EXPECT AT YOUR PRENATAL EXAMS  You will have prenatal   exams every 2 weeks until week 36. Then, you will have weekly prenatal exams. During a routine prenatal visit:  You will be weighed to make sure you and the fetus are growing normally.  Your blood pressure is taken.  Your abdomen will be measured to track your baby's growth.  The fetal heartbeat will be listened to.  Any test results from the previous visit will be discussed.  You may have a cervical check near your due date to see if you have effaced. At around 36 weeks, your caregiver will check your cervix. At the same time, your caregiver will also perform a test on the secretions of the vaginal tissue.  This test is to determine if a type of bacteria, Group B streptococcus, is present. Your caregiver will explain this further. Your caregiver may ask you:  What your birth plan is.  How you are feeling.  If you are feeling the baby move.  If you have had any abnormal symptoms, such as leaking fluid, bleeding, severe headaches, or abdominal cramping.  If you have any questions. Other tests or screenings that may be performed during your third trimester include:  Blood tests that check for low iron levels (anemia).  Fetal testing to check the health, activity level, and growth of the fetus. Testing is done if you have certain medical conditions or if there are problems during the pregnancy. FALSE LABOR You may feel small, irregular contractions that eventually go away. These are called Braxton Hicks contractions, or false labor. Contractions may last for hours, days, or even weeks before true labor sets in. If contractions come at regular intervals, intensify, or become painful, it is best to be seen by your caregiver.  SIGNS OF LABOR   Menstrual-like cramps.  Contractions that are 5 minutes apart or less.  Contractions that start on the top of the uterus and spread down to the lower abdomen and back.  A sense of increased pelvic pressure or back pain.  A watery or bloody mucus discharge that comes from the vagina. If you have any of these signs before the 37th week of pregnancy, call your caregiver right away. You need to go to the hospital to get checked immediately. HOME CARE INSTRUCTIONS   Avoid all smoking, herbs, alcohol, and unprescribed drugs. These chemicals affect the formation and growth of the baby.  Follow your caregiver's instructions regarding medicine use. There are medicines that are either safe or unsafe to take during pregnancy.  Exercise only as directed by your caregiver. Experiencing uterine cramps is a good sign to stop exercising.  Continue to eat regular,  healthy meals.  Wear a good support bra for breast tenderness.  Do not use hot tubs, steam rooms, or saunas.  Wear your seat belt at all times when driving.  Avoid raw meat, uncooked cheese, cat litter boxes, and soil used by cats. These carry germs that can cause birth defects in the baby.  Take your prenatal vitamins.  Try taking a stool softener (if your caregiver approves) if you develop constipation. Eat more high-fiber foods, such as fresh vegetables or fruit and whole grains. Drink plenty of fluids to keep your urine clear or pale yellow.  Take warm sitz baths to soothe any pain or discomfort caused by hemorrhoids. Use hemorrhoid cream if your caregiver approves.  If you develop varicose veins, wear support hose. Elevate your feet for 15 minutes, 3-4 times a day. Limit salt in your diet.  Avoid heavy lifting, wear low heal   shoes, and practice good posture.  Rest a lot with your legs elevated if you have leg cramps or low back pain.  Visit your dentist if you have not gone during your pregnancy. Use a soft toothbrush to brush your teeth and be gentle when you floss.  A sexual relationship may be continued unless your caregiver directs you otherwise.  Do not travel far distances unless it is absolutely necessary and only with the approval of your caregiver.  Take prenatal classes to understand, practice, and ask questions about the labor and delivery.  Make a trial run to the hospital.  Pack your hospital bag.  Prepare the baby's nursery.  Continue to go to all your prenatal visits as directed by your caregiver. SEEK MEDICAL CARE IF:  You are unsure if you are in labor or if your water has broken.  You have dizziness.  You have mild pelvic cramps, pelvic pressure, or nagging pain in your abdominal area.  You have persistent nausea, vomiting, or diarrhea.  You have a bad smelling vaginal discharge.  You have pain with urination. SEEK IMMEDIATE MEDICAL CARE IF:     You have a fever.  You are leaking fluid from your vagina.  You have spotting or bleeding from your vagina.  You have severe abdominal cramping or pain.  You have rapid weight loss or gain.  You have shortness of breath with chest pain.  You notice sudden or extreme swelling of your face, hands, ankles, feet, or legs.  You have not felt your baby move in over an hour.  You have severe headaches that do not go away with medicine.  You have vision changes. Document Released: 11/17/2001 Document Revised: 11/28/2013 Document Reviewed: 01/24/2013 [facility] Patient Information 2015 [place], [place]. This information is not intended to replace advice given to you by your health care provider. Make sure you discuss any questions you have with your health care provider.   Heartburn During Pregnancy (Prilosec, Nexium, Zantac) Heartburn is a burning sensation in the chest caused by stomach acid backing up into the esophagus. Heartburn is common in pregnancy because a certain hormone (progesterone) is released when a woman is pregnant. The progesterone hormone may relax the valve that separates the esophagus from the stomach. This allows acid to go up into the esophagus, causing heartburn. Heartburn may also happen in pregnancy because the enlarging uterus pushes up on the stomach, which pushes more acid into the esophagus. This is especially true in the later stages of pregnancy. Heartburn problems usually go away after giving birth. CAUSES  Heartburn is caused by stomach acid backing up into the esophagus. During pregnancy, this may result from various things, including:   The progesterone hormone.  Changing hormone levels.  The growing uterus pushing stomach acid upward.  Large meals.  Certain foods and drinks.  Exercise.  Increased acid production. SIGNS AND SYMPTOMS   Burning pain in the chest or lower throat.  Bitter taste in the mouth.  Coughing. DIAGNOSIS  Your health  care provider will typically diagnose heartburn by taking a careful history of your concern. Blood tests may be done to check for a certain type of bacteria that is associated with heartburn. Sometimes, heartburn is diagnosed by prescribing a heartburn medicine to see if the symptoms improve. In some cases, a procedure called an endoscopy may be done. In this procedure, a tube with a light and a camera on the end (endoscope) is used to examine the esophagus and the stomach. TREATMENT  Treatment will vary depending on the severity of your symptoms. Your health care provider may recommend:  Over-the-counter medicines (antacids, acid reducers) for mild heartburn.  Prescription medicines to decrease stomach acid or to protect your stomach lining.  Certain changes in your diet.  Elevating the head of your bed by putting blocks under the legs. This helps prevent stomach acid from backing up into the esophagus when you are lying down. HOME CARE INSTRUCTIONS   Only take over-the-counter or prescription medicines as directed by your health care provider.  Raise the head of your bed by putting blocks under the legs if instructed to do so by your health care provider. Sleeping with more pillows is not effective because it only changes the position of your head.  Do not exercise right after eating.  Avoid eating 2-3 hours before bed. Do not lie down right after eating.  Eat small meals throughout the day instead of three large meals.  Identify foods and beverages that make your symptoms worse and avoid them. Foods you may want to avoid include:  Peppers.  Chocolate.  High-fat foods, including fried foods.  Spicy foods.  Garlic and onions.  Citrus fruits, including oranges, grapefruit, lemons, and limes.  Food containing tomatoes or tomato products.  Mint.  Carbonated and caffeinated drinks.  Vinegar. SEEK MEDICAL CARE IF:  You have abdominal pain of any kind.  You feel burning in  your upper abdomen or chest, especially after eating or lying down.  You have nausea and vomiting.  Your stomach feels upset after you eat. SEEK IMMEDIATE MEDICAL CARE IF:   You have severe chest pain that goes down your arm or into your jaw or neck.  You feel sweaty, dizzy, or light-headed.  You become short of breath.  You vomit blood.  You have difficulty or pain with swallowing.  You have bloody or black, tarry stools.  You have episodes of heartburn more than 3 times a week, for more than 2 weeks. MAKE SURE YOU:  Understand these instructions.  Will watch your condition.  Will get help right away if you are not doing well or get worse.   This information is not intended to replace advice given to you by your health care provider. Make sure you discuss any questions you have with your health care provider.   Document Released: 11/20/2000 Document Revised: 12/14/2014 Document Reviewed: 07/12/2013 Elsevier Interactive Patient Education [employer] Elsevier Inc.

## 2015-11-19 NOTE — Progress Notes (Signed)
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 Still smoking 'like a broken toaster' per fob BP, weight, and urine reviewed.  Reports good fm. Denies regular uc's, lof, vb, uti s/s. Lots of reflux- tums aren't helping Wants BTL, discussed risks/benefits- consent signed today  Fundal Height:  26 Fetal Heart rate:  145 Edema:  none  Reviewed ptl s/s, fkc. Advised no more etoh, again reviewed risks to fetus/pt w/ smoking- advised complete cessation. Recommended Tdap at HD/PCP per CDC guidelines.  All questions were answered Assessment: 1989w1d chronic opioid use d/t lbp, etoh use during pregnancy Medication(s) Plans:  Plan is for 20 hydrocodone 5/325 per month, pt did not request today- last filled 11/23 Treatment Plan:  F/u 3wks, efw/afi/bpp u/s @ 32wks d/t ama Follow up in 3wks for high-risk OB appt  PN2 today

## 2015-11-20 LAB — CBC
HEMATOCRIT: 32.5 % — AB (ref 34.0–46.6)
HEMOGLOBIN: 11.4 g/dL (ref 11.1–15.9)
MCH: 32.9 pg (ref 26.6–33.0)
MCHC: 35.1 g/dL (ref 31.5–35.7)
MCV: 94 fL (ref 79–97)
Platelets: 299 10*3/uL (ref 150–379)
RBC: 3.46 x10E6/uL — ABNORMAL LOW (ref 3.77–5.28)
RDW: 13.4 % (ref 12.3–15.4)
WBC: 15 10*3/uL — AB (ref 3.4–10.8)

## 2015-11-20 LAB — GLUCOSE TOLERANCE, 2 HOURS W/ 1HR
GLUCOSE, FASTING: 72 mg/dL (ref 65–91)
Glucose, 1 hour: 122 mg/dL (ref 65–179)
Glucose, 2 hour: 80 mg/dL (ref 65–152)

## 2015-11-20 LAB — PMP SCREEN PROFILE (10S), URINE
Amphetamine Screen, Ur: NEGATIVE ng/mL
BARBITURATE SCRN UR: NEGATIVE ng/mL
BENZODIAZEPINE SCREEN, URINE: NEGATIVE ng/mL
CANNABINOIDS UR QL SCN: NEGATIVE ng/mL
COCAINE(METAB.) SCREEN, URINE: NEGATIVE ng/mL
Creatinine(Crt), U: 43.4 mg/dL (ref 20.0–300.0)
Methadone Scn, Ur: NEGATIVE ng/mL
Opiate Scrn, Ur: POSITIVE ng/mL
Oxycodone+Oxymorphone Ur Ql Scn: POSITIVE ng/mL
PCP SCRN UR: NEGATIVE ng/mL
PH UR, DRUG SCRN: 7.8 (ref 4.5–8.9)
Propoxyphene, Screen: NEGATIVE ng/mL

## 2015-11-20 LAB — HIV ANTIBODY (ROUTINE TESTING W REFLEX): HIV SCREEN 4TH GENERATION: NONREACTIVE

## 2015-11-20 LAB — ANTIBODY SCREEN: ANTIBODY SCREEN: NEGATIVE

## 2015-11-20 LAB — RPR: RPR Ser Ql: NONREACTIVE

## 2015-12-10 ENCOUNTER — Encounter: Payer: Medicaid Other | Admitting: Obstetrics & Gynecology

## 2015-12-11 ENCOUNTER — Encounter: Payer: Self-pay | Admitting: *Deleted

## 2015-12-11 ENCOUNTER — Encounter: Payer: Medicaid Other | Admitting: Women's Health

## 2015-12-20 ENCOUNTER — Telehealth: Payer: Self-pay | Admitting: *Deleted

## 2015-12-20 NOTE — Telephone Encounter (Signed)
Will address with patient at visit on Monday Cannot be given unless at the time of an appointment

## 2015-12-23 ENCOUNTER — Ambulatory Visit (INDEPENDENT_AMBULATORY_CARE_PROVIDER_SITE_OTHER): Payer: Medicaid Other | Admitting: Women's Health

## 2015-12-23 ENCOUNTER — Encounter: Payer: Self-pay | Admitting: Women's Health

## 2015-12-23 ENCOUNTER — Telehealth: Payer: Self-pay | Admitting: *Deleted

## 2015-12-23 VITALS — BP 88/40 | HR 80 | Wt 131.0 lb

## 2015-12-23 DIAGNOSIS — Z331 Pregnant state, incidental: Secondary | ICD-10-CM

## 2015-12-23 DIAGNOSIS — O09523 Supervision of elderly multigravida, third trimester: Secondary | ICD-10-CM

## 2015-12-23 DIAGNOSIS — Z1389 Encounter for screening for other disorder: Secondary | ICD-10-CM

## 2015-12-23 DIAGNOSIS — N898 Other specified noninflammatory disorders of vagina: Secondary | ICD-10-CM

## 2015-12-23 DIAGNOSIS — A599 Trichomoniasis, unspecified: Secondary | ICD-10-CM

## 2015-12-23 DIAGNOSIS — O09893 Supervision of other high risk pregnancies, third trimester: Secondary | ICD-10-CM

## 2015-12-23 DIAGNOSIS — O26893 Other specified pregnancy related conditions, third trimester: Secondary | ICD-10-CM | POA: Diagnosis not present

## 2015-12-23 DIAGNOSIS — F1210 Cannabis abuse, uncomplicated: Secondary | ICD-10-CM

## 2015-12-23 DIAGNOSIS — F1190 Opioid use, unspecified, uncomplicated: Secondary | ICD-10-CM

## 2015-12-23 DIAGNOSIS — F1290 Cannabis use, unspecified, uncomplicated: Secondary | ICD-10-CM

## 2015-12-23 DIAGNOSIS — O34219 Maternal care for unspecified type scar from previous cesarean delivery: Secondary | ICD-10-CM

## 2015-12-23 LAB — POCT URINALYSIS DIPSTICK
Blood, UA: NEGATIVE
GLUCOSE UA: NEGATIVE
KETONES UA: NEGATIVE
Leukocytes, UA: NEGATIVE
Nitrite, UA: NEGATIVE
PROTEIN UA: NEGATIVE

## 2015-12-23 LAB — POCT WET PREP (WET MOUNT): CLUE CELLS WET PREP WHIFF POC: POSITIVE

## 2015-12-23 MED ORDER — HYDROCODONE-ACETAMINOPHEN 5-325 MG PO TABS: 1.0000 | ORAL_TABLET | Freq: Four times a day (QID) | ORAL | Status: DC | PRN

## 2015-12-23 MED ORDER — METRONIDAZOLE 500 MG PO TABS: 2000.0000 mg | ORAL_TABLET | Freq: Once | ORAL | Status: DC

## 2015-12-23 NOTE — Patient Instructions (Addendum)
Call the office ([phone]) or go to [facility] if:  You begin to have strong, frequent contractions  Your water breaks.  Sometimes it is a big gush of fluid, sometimes it is just a trickle that keeps getting your panties wet or running down your legs  You have vaginal bleeding.  It is normal to have a small amount of spotting if your cervix was checked.   You don't feel your baby moving like normal.  If you don't, get you something to eat and drink and lay down and focus on feeling your baby move.  You should feel at least 10 movements in 2 hours.  If you don't, you should call the office or go to [facility].    Preterm Labor Information Preterm labor is when labor starts at less than 37 weeks of pregnancy. The normal length of a pregnancy is 39 to 41 weeks. CAUSES Often, there is no identifiable underlying cause as to why a woman goes into preterm labor. One of the most common known causes of preterm labor is infection. Infections of the uterus, cervix, vagina, amniotic sac, bladder, kidney, or even the lungs (pneumonia) can cause labor to start. Other suspected causes of preterm labor include:   Urogenital infections, such as yeast infections and bacterial vaginosis.   Uterine abnormalities (uterine shape, uterine septum, fibroids, or bleeding from the placenta).   A cervix that has been operated on (it may fail to stay closed).   Malformations in the fetus.   Multiple gestations (twins, triplets, and so on).   Breakage of the amniotic sac.  RISK FACTORS  Having a previous history of preterm labor.   Having premature rupture of membranes (PROM).   Having a placenta that covers the opening of the cervix (placenta previa).   Having a placenta that separates from the uterus (placental abruption).   Having a cervix that is too weak to hold the fetus in the uterus (incompetent cervix).   Having too much fluid in the amniotic sac (polyhydramnios).   Taking  illegal drugs or smoking while pregnant.   Not gaining enough weight while pregnant.   Being younger than 18 and older than 35 years old.   Having a low socioeconomic status.   Being African American. SYMPTOMS Signs and symptoms of preterm labor include:   Menstrual-like cramps, abdominal pain, or back pain.  Uterine contractions that are regular, as frequent as six in an hour, regardless of their intensity (may be mild or painful).  Contractions that start on the top of the uterus and spread down to the lower abdomen and back.   A sense of increased pelvic pressure.   A watery or bloody mucus discharge that comes from the vagina.  TREATMENT Depending on the length of the pregnancy and other circumstances, your health care provider may suggest bed rest. If necessary, there are medicines that can be given to stop contractions and to mature the fetal lungs. If labor happens before 34 weeks of pregnancy, a prolonged hospital stay may be recommended. Treatment depends on the condition of both you and the fetus.  WHAT SHOULD YOU DO IF YOU THINK YOU ARE IN PRETERM LABOR? Call your health care provider right away. You will need to go to the hospital to get checked immediately. HOW CAN YOU PREVENT PRETERM LABOR IN FUTURE PREGNANCIES? You should:   Stop smoking if you smoke.  Maintain healthy weight gain and avoid chemicals and drugs that are not necessary.  Be watchful for   any type of infection.  Inform your health care provider if you have a known history of preterm labor.   This information is not intended to replace advice given to you by your health care provider. Make sure you discuss any questions you have with your health care provider.   Document Released: 02/13/2004 Document Revised: 07/26/2013 Document Reviewed: 12/26/2012 Elsevier Interactive Patient Education 2016 [employer]  Trichomoniasis Trichomoniasis is an infection caused by an organism called  Trichomonas. The infection can affect both women and men. In women, the outer female genitalia and the vagina are affected. In men, the penis is mainly affected, but the prostate and other reproductive organs can also be involved. Trichomoniasis is a sexually transmitted infection (STI) and is most often passed to another person through sexual contact.  RISK FACTORS  Having unprotected sexual intercourse.  Having sexual intercourse with an infected partner. SIGNS AND SYMPTOMS  Symptoms of trichomoniasis in women include:  Abnormal gray-green frothy vaginal discharge.  Itching and irritation of the vagina.  Itching and irritation of the area outside the vagina. Symptoms of trichomoniasis in men include:   Penile discharge with or without pain.  Pain during urination. This results from inflammation of the urethra. DIAGNOSIS  Trichomoniasis may be found during a Pap test or physical exam. Your health care provider may use one of the following methods to help diagnose this infection:  Testing the pH of the vagina with a test tape.  Using a vaginal swab test that checks for the Trichomonas organism. A test is available that provides results within a few minutes.  Examining a urine sample.  Testing vaginal secretions. Your health care provider may test you for other STIs, including HIV. TREATMENT   You may be given medicine to fight the infection. Women should inform their health care provider if they could be or are pregnant. Some medicines used to treat the infection should not be taken during pregnancy.  Your health care provider may recommend over-the-counter medicines or creams to decrease itching or irritation.  Your sexual partner will need to be treated if infected.  Your health care provider may test you for infection again 3 months after treatment. HOME CARE INSTRUCTIONS   Take medicines only as directed by your health care provider.  Take over-the-counter medicine for  itching or irritation as directed by your health care provider.  Do not have sexual intercourse while you have the infection.  Women should not douche or wear tampons while they have the infection.  Discuss your infection with your partner. Your partner may have gotten the infection from you, or you may have gotten it from your partner.  Have your sex partner get examined and treated if necessary.  Practice safe, informed, and protected sex.  See your health care provider for other STI testing. SEEK MEDICAL CARE IF:   You still have symptoms after you finish your medicine.  You develop abdominal pain.  You have pain when you urinate.  You have bleeding after sexual intercourse.  You develop a rash.  Your medicine makes you sick or makes you throw up (vomit). MAKE SURE YOU:  Understand these instructions.  Will watch your condition.  Will get help right away if you are not doing well or get worse.   This information is not intended to replace advice given to you by your health care provider. Make sure you discuss any questions you have with your health care provider.   Document Released: 05/19/2001 Document Revised: 12/14/2014  Document Reviewed: 09/04/2013 Elsevier Interactive Patient Education [employer].

## 2015-12-23 NOTE — Progress Notes (Signed)
High Risk Pregnancy Diagnosis(es): chronic opiate use d/t lbp, h/o etoh use this pregnancy [id number] 5651w0d Estimated Date of Delivery: 02/17/16 BP 88/40 mmHg  Pulse 80  Wt 131 lb (59.421 kg)  LMP 05/05/2015 (Exact Date)  Urinalysis: Negative HPI:  Denies any further etoh use since last visit. Had to cancel an appt, then no showed for next. Reports lots of pressure, some uc's, denies abnormal d/c itching/odor/irritation. Last uds (11/19/15) had + hydrocodone (which she gets rx from us), but also had +oxycodone- confronted about this- pt states she took one of her sisters expired pain pills once-thought it was the same as hers. States she hasn't been having to take her rx as much, last rx was in Nov. States she hasn't taken any in a couple of weeks, but is out of her rx and requests rx today. Made her promise not to take any pills from any other location, only rx'd 10 instead of 20 today since she said she is not having to take as often. Will repeat uds again today.  BP, weight, and urine reviewed.  Reports good fm. Denies regular uc's, lof, vb, uti s/s.   Fundal Height:  30 Fetal Heart rate:  155 Spec exam: cx visually closed w/ some thickness, small amt thin frothy white malodorous d/c, wet prep- many trichomonas- rx metronidazole 2mg  po x 1- absolutely no etoh while taking, called in rx for Barrett Brown dob [birth year], nk[facility] to [facility]: ft/th/-2 Edema:  none  Reviewed ptl s/s, fkc All questions were answered Assessment: 6351w0d chronic opiate use, h/o etoh use, trichomonas dx today Medication(s) Plans:  rx hydrocodone #10 w/ 0RF, rx metronidazole 2gm po x 1 for pt and partner- no etoh/sex- poc at next visit Treatment Plan:  Get efw/afi/bpp/dopp asap d/t ama/chronic opiate use Follow up in asap for u/s as above (no visit), then 2wks for high-risk OB appt and trich poc and to discuss mode of delivery (Repeat c/s vs. TOLAC)

## 2015-12-23 NOTE — Telephone Encounter (Signed)
Pt informed per Joellyn HaffKim Booker, CNM no sex until after her next appt for proof of cure. Pt verbalized understanding.

## 2015-12-24 ENCOUNTER — Ambulatory Visit (INDEPENDENT_AMBULATORY_CARE_PROVIDER_SITE_OTHER): Payer: Medicaid Other

## 2015-12-24 DIAGNOSIS — Z3A33 33 weeks gestation of pregnancy: Secondary | ICD-10-CM

## 2015-12-24 DIAGNOSIS — F1190 Opioid use, unspecified, uncomplicated: Secondary | ICD-10-CM

## 2015-12-24 DIAGNOSIS — O09523 Supervision of elderly multigravida, third trimester: Secondary | ICD-10-CM

## 2015-12-24 DIAGNOSIS — O99323 Drug use complicating pregnancy, third trimester: Secondary | ICD-10-CM | POA: Diagnosis not present

## 2015-12-24 DIAGNOSIS — O09893 Supervision of other high risk pregnancies, third trimester: Secondary | ICD-10-CM

## 2015-12-24 LAB — PMP SCREEN PROFILE (10S), URINE
AMPHETAMINE SCRN UR: POSITIVE ng/mL
Barbiturate Screen, Ur: NEGATIVE ng/mL
Benzodiazepine Screen, Urine: NEGATIVE ng/mL
CANNABINOIDS UR QL SCN: POSITIVE ng/mL
COCAINE(METAB.) SCREEN, URINE: NEGATIVE ng/mL
Creatinine(Crt), U: 188.8 mg/dL (ref 20.0–300.0)
Methadone Scn, Ur: NEGATIVE ng/mL
OPIATE SCRN UR: POSITIVE ng/mL
Oxycodone+Oxymorphone Ur Ql Scn: POSITIVE ng/mL
PCP SCRN UR: NEGATIVE ng/mL
PROPOXYPHENE SCREEN: NEGATIVE ng/mL
Ph of Urine: 6.2 (ref 4.5–8.9)

## 2015-12-24 NOTE — Progress Notes (Signed)
[place] 33+2wks,cephalic,efw 2063g 57%,afi 11.5cm,RI .63, .66,BPP 8/8,post pl gr 3,fhr 158 bpm,

## 2016-01-06 ENCOUNTER — Encounter: Payer: Self-pay | Admitting: Obstetrics & Gynecology

## 2016-01-06 ENCOUNTER — Ambulatory Visit (INDEPENDENT_AMBULATORY_CARE_PROVIDER_SITE_OTHER): Payer: Medicaid Other | Admitting: Obstetrics & Gynecology

## 2016-01-06 VITALS — BP 100/60 | HR 92 | Wt 135.3 lb

## 2016-01-06 DIAGNOSIS — O09893 Supervision of other high risk pregnancies, third trimester: Secondary | ICD-10-CM

## 2016-01-06 DIAGNOSIS — Z331 Pregnant state, incidental: Secondary | ICD-10-CM

## 2016-01-06 DIAGNOSIS — Z1389 Encounter for screening for other disorder: Secondary | ICD-10-CM

## 2016-01-06 LAB — POCT URINALYSIS DIPSTICK
GLUCOSE UA: NEGATIVE
Leukocytes, UA: NEGATIVE
NITRITE UA: NEGATIVE
Protein, UA: NEGATIVE
RBC UA: NEGATIVE

## 2016-01-06 NOTE — Progress Notes (Signed)
Fetal Surveillance Testing today:  FHR   High Risk Pregnancy Diagnosis(es):   Polysubstance abuse  [id number] [redacted]w[redacted]d Estimated Date of Delivery: 02/17/16  Blood pressure 100/60, pulse 92, weight 135 lb 4.8 oz (61.372 kg), last menstrual period 05/05/2015.  Urinalysis: Negative   HPI: The patient is being seen today for ongoing management of as above. Today she reports no rpoblems   BP weight and urine results all reviewed and noted. Patient reports good fetal movement, denies any bleeding and no rupture of membranes symptoms or regular contractions.  Fundal Height:  32 Fetal Heart rate:  150 Edema:  none  Patient is without complaints other than noted in her HPI. All questions were answered.  All lab and sonogram results have been reviewed. Comments: abnormal:    Assessment:  1.  Pregnancy at [redacted]w[redacted]d,  Estimated Date of Delivery: 02/17/16 :                          2.  Polysubstance abuse                        3.    Medication(s) Plans:  none  Treatment Plan:  C section at 39 weeks  No Follow-up on file. for appointment for high risk OB care  No orders of the defined types were placed in this encounter.   Orders Placed This Encounter  Procedures  . POCT urinalysis dipstick

## 2016-01-07 ENCOUNTER — Other Ambulatory Visit: Payer: Self-pay | Admitting: Obstetrics & Gynecology

## 2016-01-07 ENCOUNTER — Telehealth: Payer: Self-pay | Admitting: *Deleted

## 2016-01-07 ENCOUNTER — Other Ambulatory Visit: Payer: Medicaid Other

## 2016-01-07 DIAGNOSIS — O99323 Drug use complicating pregnancy, third trimester: Secondary | ICD-10-CM

## 2016-01-07 MED ORDER — HYDROCODONE-ACETAMINOPHEN 5-325 MG PO TABS: 1.0000 | ORAL_TABLET | Freq: Four times a day (QID) | ORAL | Status: DC | PRN

## 2016-01-07 NOTE — Telephone Encounter (Signed)
Pt will have to pick up And we will check her urine again and it better not have any oxycodone in it

## 2016-01-07 NOTE — Telephone Encounter (Signed)
Pt requesting a refill on Hydrocodone, states she forgot to ask for the refill at her appt with Dr. Despina Hidden yesterday.

## 2016-01-07 NOTE — Telephone Encounter (Signed)
Pt informed Rx for Hydrocodone at front desk, will need to get urine sample for UDS today. Pt verbalized understanding.

## 2016-01-08 LAB — PMP SCREEN PROFILE (10S), URINE
AMPHETAMINE SCRN UR: NEGATIVE ng/mL
BARBITURATE SCRN UR: NEGATIVE ng/mL
BENZODIAZEPINE SCREEN, URINE: NEGATIVE ng/mL
COCAINE(METAB.) SCREEN, URINE: NEGATIVE ng/mL
CREATININE(CRT), U: 73.7 mg/dL (ref 20.0–300.0)
Cannabinoids Ur Ql Scn: NEGATIVE ng/mL
METHADONE SCREEN, URINE: NEGATIVE ng/mL
OPIATE SCRN UR: NEGATIVE ng/mL
OXYCODONE+OXYMORPHONE UR QL SCN: NEGATIVE ng/mL
PCP Scrn, Ur: NEGATIVE ng/mL
PROPOXYPHENE SCREEN: NEGATIVE ng/mL
Ph of Urine: 6.1 (ref 4.5–8.9)

## 2016-01-20 ENCOUNTER — Encounter: Payer: Medicaid Other | Admitting: Obstetrics and Gynecology

## 2016-01-20 ENCOUNTER — Ambulatory Visit (INDEPENDENT_AMBULATORY_CARE_PROVIDER_SITE_OTHER): Payer: Medicaid Other | Admitting: Obstetrics and Gynecology

## 2016-01-20 ENCOUNTER — Encounter: Payer: Self-pay | Admitting: Obstetrics and Gynecology

## 2016-01-20 ENCOUNTER — Telehealth: Payer: Self-pay | Admitting: *Deleted

## 2016-01-20 VITALS — BP 100/50 | HR 104 | Wt 135.5 lb

## 2016-01-20 DIAGNOSIS — Z3493 Encounter for supervision of normal pregnancy, unspecified, third trimester: Secondary | ICD-10-CM

## 2016-01-20 DIAGNOSIS — Z1389 Encounter for screening for other disorder: Secondary | ICD-10-CM

## 2016-01-20 DIAGNOSIS — Z331 Pregnant state, incidental: Secondary | ICD-10-CM

## 2016-01-20 DIAGNOSIS — Z369 Encounter for antenatal screening, unspecified: Secondary | ICD-10-CM

## 2016-01-20 LAB — POCT URINALYSIS DIPSTICK
Glucose, UA: NEGATIVE
Ketones, UA: NEGATIVE
Leukocytes, UA: NEGATIVE
NITRITE UA: NEGATIVE
RBC UA: NEGATIVE

## 2016-01-20 MED ORDER — HYDROCODONE-HOMATROPINE 5-1.5 MG/5ML PO SYRP: 5.0000 mL | ORAL_SOLUTION | Freq: Four times a day (QID) | ORAL | Status: DC | PRN

## 2016-01-20 NOTE — Progress Notes (Signed)
Patient ID: Pamela [name], female   DOB: 1980/12/11, 35 y.o.   MRN: [id number]   High Risk Pregnancy Diagnosis(es):   Polysubstance abuse  [id number] [redacted]w[redacted]d Estimated Date of Delivery: 02/17/16    HPI: The patient is being seen today for ongoing management of polysubstance abuse. Today she reports no complaints. She states she was referred to me by Dr. Despina Hidden, as he recommended she have a Cesarean section. She states she has had 2 Cesarean sections before.  Patient reports good fetal movement, denies any bleeding and no rupture of membranes symptoms or regular contractions. Patient plans to have a tubal ligation for contraception after this pregnancy.    BP weight and urine results all reviewed and noted. Blood pressure 100/50, pulse 104, weight 135 lb 8 oz (61.462 kg), last menstrual period 05/05/2015.  Fetal Surveillance Testing today:  n/a Fundal Height:  35 cm Fetal Heart rate:  161 bpm Edema: n/a Cervix: 1.5 cm, 20% effacement, -2 Urinalysis: Positive for trace protein, otherwise negative   Questions were answered.  Lab and sonogram results have been reviewed. Comments: not done   Assessment:  1.  Pregnancy at [redacted]w[redacted]d,  Estimated Date of Delivery: 02/17/16 :                          2.  High-Risk Pregnancy due to Polysubstance Abuse                        3.  GBS collected.    4. History of C-section x 2. Pt desires repeat Cesarean section.  Medication(s) Plans:  n/a  Treatment Plan:  Cesarean section + tubal ligation at 39 weeks, scheduled for Tues March 7 at 3 pm.   Follow up in 1 weeks for appointment for high risk OB care,     By signing my name below, I, Ronney Lion, attest that this documentation has been prepared under the direction and in the presence of Tilda Burrow, MD. Electronically Signed: Ronney Lion, ED Scribe. 01/20/2016. 2:44 PM.  I personally performed the services described in this documentation, which was SCRIBED in my presence. The recorded information  has been reviewed and considered accurate. It has been edited as necessary during review. Tilda Burrow, MD

## 2016-01-20 NOTE — Progress Notes (Signed)
Pt states that she has been having pain since last night. Pt states that she is having a lot of pressure as well. Pt has a bad cold and stuffy nose.

## 2016-01-20 NOTE — Telephone Encounter (Signed)
Pt c/o contraction, "not close together feels like she has the flu." pt has an appt with Dr.Ferguson today at 2:45 pm would like to be seen sooner. Pt given an appt with Dr.Ferguson at 1:15 pm.

## 2016-01-21 ENCOUNTER — Other Ambulatory Visit: Payer: Self-pay | Admitting: Obstetrics and Gynecology

## 2016-01-21 LAB — GC/CHLAMYDIA PROBE AMP
CHLAMYDIA, DNA PROBE: NEGATIVE
NEISSERIA GONORRHOEAE BY PCR: NEGATIVE

## 2016-01-23 ENCOUNTER — Telehealth: Payer: Self-pay | Admitting: Obstetrics and Gynecology

## 2016-01-23 ENCOUNTER — Other Ambulatory Visit: Payer: Self-pay | Admitting: Obstetrics & Gynecology

## 2016-01-23 ENCOUNTER — Other Ambulatory Visit: Payer: Self-pay | Admitting: Obstetrics and Gynecology

## 2016-01-23 NOTE — Telephone Encounter (Signed)
Pt called stating that she needs a refill of her Hydrocodone, please contact pt. Pt states she is having pressure and some contraction but there not consistent.

## 2016-01-27 ENCOUNTER — Telehealth: Payer: Self-pay | Admitting: *Deleted

## 2016-01-27 ENCOUNTER — Encounter: Payer: Medicaid Other | Admitting: Obstetrics & Gynecology

## 2016-01-27 NOTE — Telephone Encounter (Signed)
Pt states she is 37 wks and she fell on her stomach today.  Pt states she is starting to feel baby move now and she is having cramping, no loss of fluids and no bleeding.  Pt is scheduled this afternoon @ 3pm for routine OB.  I spoke with Joellyn Haff, CNM and she states that after a fall they like to monitor the baby for 4 hours so recommends pt go to [facility] for evaluation.  Pt verbalized understanding and [facility]

## 2016-01-30 ENCOUNTER — Other Ambulatory Visit: Payer: Self-pay | Admitting: Obstetrics and Gynecology

## 2016-01-30 ENCOUNTER — Encounter: Payer: Self-pay | Admitting: Obstetrics and Gynecology

## 2016-01-30 ENCOUNTER — Ambulatory Visit (INDEPENDENT_AMBULATORY_CARE_PROVIDER_SITE_OTHER): Payer: Medicaid Other | Admitting: Obstetrics and Gynecology

## 2016-01-30 VITALS — BP 100/58 | HR 84 | Wt 137.0 lb

## 2016-01-30 DIAGNOSIS — Z331 Pregnant state, incidental: Secondary | ICD-10-CM

## 2016-01-30 DIAGNOSIS — O09523 Supervision of elderly multigravida, third trimester: Secondary | ICD-10-CM

## 2016-01-30 DIAGNOSIS — Z3A38 38 weeks gestation of pregnancy: Secondary | ICD-10-CM

## 2016-01-30 DIAGNOSIS — Z1389 Encounter for screening for other disorder: Secondary | ICD-10-CM

## 2016-01-30 DIAGNOSIS — F1190 Opioid use, unspecified, uncomplicated: Secondary | ICD-10-CM

## 2016-01-30 DIAGNOSIS — O99323 Drug use complicating pregnancy, third trimester: Secondary | ICD-10-CM

## 2016-01-30 DIAGNOSIS — O09893 Supervision of other high risk pregnancies, third trimester: Secondary | ICD-10-CM

## 2016-01-30 LAB — POCT URINALYSIS DIPSTICK
Glucose, UA: NEGATIVE
Ketones, UA: NEGATIVE
LEUKOCYTES UA: NEGATIVE
NITRITE UA: NEGATIVE
PROTEIN UA: NEGATIVE
RBC UA: NEGATIVE

## 2016-01-30 NOTE — Progress Notes (Signed)
Patient ID: Pamela [name], female   DOB: 09/25/1981, 35 y.o.   MRN: [id number]    High Risk Pregnancy Diagnosis(es):   Polysubstance abuse   [id number] [redacted]w[redacted]d Estimated Date of Delivery: 02/17/16     HPI: The patient is being seen today for ongoing management of polysubstance abuse. Today she reports her cough is improved since her last visit. She states she is getting tubal ligation following delivery. H/o 2 cesarean sections.   Patient reports good fetal movement, denies any bleeding and no rupture of membranes symptoms or regular contractions.   BP weight and urine results all reviewed and noted. Blood pressure 100/58, pulse 84, weight 137 lb (62.143 kg), last menstrual period 05/05/2015.  Fetal Surveillance Testing today:  none Fundal Height:  36 cm  Fetal Heart rate:  142 bpm Edema:  none Urinalysis: Negative   Questions were answered.  Lab and sonogram results have been reviewed. Comments: normal   Assessment:  1.  Pregnancy at [redacted]w[redacted]d,  Estimated Date of Delivery: 02/17/16 :  [id number]                         2.  High risk pregnancy due to polysubstance abuse                         3.  H/o 2 cesarean sections    4. Cesarean section + tubal ligation at 39 weeks, scheduled for Tues March 7 at 3 pm.   Medication(s) Plans:  unchanged  Treatment Plan:  Repeat cesarean with tubal ligation on 02/11/16 at 3PM  Follow up in 1 weeks for appointment for high risk OB care    By signing my name below, I, Doreatha Martin, attest that this documentation has been prepared under the direction and in the presence of Tilda Burrow, MD. Electronically Signed: Doreatha Martin, ED Scribe. 01/30/2016. 10:33 AM.  I personally performed the services described in this documentation, which was SCRIBED in my presence. The recorded information has been reviewed and considered accurate. It has been edited as necessary during review. Tilda Burrow, MD

## 2016-01-30 NOTE — Progress Notes (Signed)
Pt denies any problems or concerns at this time. Pt states that she had to go to the ED for a fall that she had the other day.

## 2016-02-01 LAB — PMP SCREEN PROFILE (10S), URINE
AMPHETAMINE SCRN UR: NEGATIVE ng/mL
Barbiturate Screen, Ur: NEGATIVE ng/mL
Benzodiazepine Screen, Urine: NEGATIVE ng/mL
CANNABINOIDS UR QL SCN: NEGATIVE ng/mL
COCAINE(METAB.) SCREEN, URINE: NEGATIVE ng/mL
Creatinine(Crt), U: 72.3 mg/dL (ref 20.0–300.0)
METHADONE SCREEN, URINE: NEGATIVE ng/mL
OPIATE SCRN UR: POSITIVE ng/mL
OXYCODONE+OXYMORPHONE UR QL SCN: NEGATIVE ng/mL
PCP SCRN UR: NEGATIVE ng/mL
PROPOXYPHENE SCREEN: NEGATIVE ng/mL
Ph of Urine: 6.6 (ref 4.5–8.9)

## 2016-02-06 ENCOUNTER — Ambulatory Visit (INDEPENDENT_AMBULATORY_CARE_PROVIDER_SITE_OTHER): Payer: Medicaid Other | Admitting: Obstetrics and Gynecology

## 2016-02-06 ENCOUNTER — Encounter: Payer: Self-pay | Admitting: Obstetrics and Gynecology

## 2016-02-06 ENCOUNTER — Other Ambulatory Visit: Payer: Self-pay | Admitting: Obstetrics and Gynecology

## 2016-02-06 VITALS — BP 120/60 | HR 90 | Wt 138.0 lb

## 2016-02-06 DIAGNOSIS — Z1389 Encounter for screening for other disorder: Secondary | ICD-10-CM

## 2016-02-06 DIAGNOSIS — O34219 Maternal care for unspecified type scar from previous cesarean delivery: Secondary | ICD-10-CM

## 2016-02-06 DIAGNOSIS — Z331 Pregnant state, incidental: Secondary | ICD-10-CM

## 2016-02-06 DIAGNOSIS — O09893 Supervision of other high risk pregnancies, third trimester: Secondary | ICD-10-CM

## 2016-02-06 LAB — POCT URINALYSIS DIPSTICK
Blood, UA: NEGATIVE
Glucose, UA: NEGATIVE
Ketones, UA: NEGATIVE
LEUKOCYTES UA: NEGATIVE
NITRITE UA: NEGATIVE
PROTEIN UA: NEGATIVE

## 2016-02-06 NOTE — Progress Notes (Signed)
Patient ID: Pamela [name], female   DOB: [birth year], 35 y.o.   MRN: [id number]    High Risk Pregnancy Diagnosis(es):   Polysubstance abuse, prior c/s x 2 not for TOLAC, desrire for perm sterilization  [id number] [redacted]w[redacted]d Estimated Date of Delivery: 02/17/16     HPI: The patient is being seen today for ongoing management of history of polysubstance abuse. Recently she has taken some opiate cough meds, and had opiates for injury. Today she reports no complaints. Patient states she had recently falling while walking a dog, which caused hand and knee abrasions. She was seen at [facility] ED immediately afterwards, and she was told the fetus was unaffected, with a good fetal heart rate. Patient is scheduled for a Cesarean section with BTL on 02/11/16 - she notes she had already signed consent forms that are up to date. Patient notes a history of Cesarean section x 2, as well as 2 vaginal deliveries.     Patient reports good fetal movement, denies any bleeding and no rupture of membranes symptoms or regular contractions.   BP weight and urine results all reviewed and noted. Blood pressure 120/60, pulse 90, weight 138 lb (62.596 kg), last menstrual period 05/05/2015.  Fetal Surveillance Testing today:  none Fundal Height:  37 cm Fetal Heart rate:  159 bpm Edema:  none Cervix: 2 cm, 50% effacement, -2 Urinalysis: Negative    Questions were answered.  Lab and sonogram results have been reviewed. Comments: not done  Assessment:  1.  Pregnancy at [redacted]w[redacted]d,  Estimated Date of Delivery: 02/17/16 :     2. High risk pregnancy due to polysubstance abuse    3. H/o 2 cesarean sections    4. Cesarean section + tubal ligation at 39 weeks, scheduled for Tues March 7 at 3 pm.    Medication(s) Plans:  unchanged  Treatment Plan:  Repeat Cesarean section and tubal ligation scheduled for 02/11/16 at 3 PM  Follow up in 2 weeks for post-op check     By signing my name below, I, Ronney Lion, attest that this  documentation has been prepared under the direction and in the presence of Tilda Burrow, MD. Electronically Signed: Ronney Lion, ED Scribe. 02/06/2016. 11:46 AM.  I personally performed the services described in this documentation, which was SCRIBED in my presence. The recorded information has been reviewed and considered accurate. It has been edited as necessary during review. Tilda Burrow, MD

## 2016-02-06 NOTE — Progress Notes (Signed)
Pt states that she has had to take hydrocodone three times this week and took Rx cough syrup last night to help her sleep. UDS was not sent today. At last visit pt stated that she was not taking her hydrocodone but had a positive UDS that same visit.

## 2016-02-06 NOTE — H&P (Signed)
Pamela [name] is a 35 y.o. female being admitted for Repeat Cesarean section and tubal ligation by filshie clips. She declined TOLAC, after being offered. She has had 2 spontaneous vaginal deliveries, then 2 cesareans, one for fetal macrosomia, then repeat cesarean. She desires permanent sterilization, and has signed medicaid sterilization forms 11/19/15.  She reaffirms desire for permanent sterilization at this time. History  Ob course has been notable for UDS's positive for opiates at times when no Opiate Rx was involved in her care.Recently she alleges a fall while walking her dog, and that she has been taking some Hycodan for cough, and inability to sleep , so UDS now would be positive. There have been no other abuse agents on several uds's. OB History    Gravida Para Term Preterm AB TAB SAB Ectopic Multiple Living   Past Medical History  Diagnosis Date  . Abnormal vaginal Pap smear   . Vaginal Pap smear, abnormal   . Migraines   . Pregnant 06/19/2015  . Depression    Past Surgical History  Procedure Laterality Date  . Cesarean section  x 2  . Back surgery    . Tonsillectomy and adenoidectomy     Family History: family history includes Dementia in her paternal grandfather and paternal grandmother. Social History:  reports that she has been smoking Cigarettes.  She started smoking about 8 years ago. She has a 9.5 pack-year smoking history. She has never used smokeless tobacco. She reports that she does not drink alcohol or use illicit drugs.   Prenatal Transfer Tool  Maternal Diabetes: No Genetic Screening: Normal Maternal Ultrasounds/Referrals: Normal Fetal Ultrasounds or other Referrals:  None Maternal Substance Abuse:  Yes:  Type: Prescription drugs  Significant Maternal Medications: on Hycodan recently Other Comments:  None  ROS Recent fall WITH negative monitoring of baby   Last menstrual period 05/05/2015. Exam Physical Exam  Prenatal  labs: ABO, Rh: A/Positive/-- (08/09 1600) Antibody: Negative (12/13 0916) Rubella: 1.43 (08/09 1600) RPR: Non Reactive (12/13 0916)  HBsAg: Negative (08/09 1600)  HIV: Non Reactive (12/13 0916)  GBS:     Assessment/Plan: Pregnancy 39 wk , prior cesarean x 2 declining tolac, for repeat cesarean and tubal ligation. Hx positive uds's for opiates.   Pamela [name] V 02/06/2016, 12:05 PM

## 2016-02-07 NOTE — Patient Instructions (Addendum)
Your procedure is scheduled on:  Tuesday, February 11, 2016  [facility]  1:45 PM  Pick up the phone at the desk and dial [phone].  Call this number if you have problems the morning of surgery: [phone].  Remember:  Do NOT eat food:  After Midnight Tonight  Do NOT drink clear liquids after:  11:00 AM day of surgery  Take these medicines the morning of surgery with a SIP OF WATER:  None  Do NOT wear jewelry (body piercing), metal hair clips/bobby pins, or nail polish. Do NOT wear lotions, powders, or perfumes.  You may wear deoderant. Do NOT shave for 48 hours prior to surgery. Do NOT bring valuables to the hospital.  Leave suitcase in car.  After surgery it may be brought to your room.  For patients admitted to the hospital, checkout time is 11:00 AM the day of discharge.

## 2016-02-10 ENCOUNTER — Encounter (HOSPITAL_COMMUNITY): Payer: Self-pay

## 2016-02-10 ENCOUNTER — Encounter (HOSPITAL_COMMUNITY)
Admission: RE | Admit: 2016-02-10 | Discharge: 2016-02-10 | Disposition: A | Discharge status: Home / Self Care | Payer: Medicaid Other | Source: Ambulatory Visit | Attending: Obstetrics and Gynecology | Admitting: Obstetrics and Gynecology

## 2016-02-10 LAB — CBC
HEMATOCRIT: 34.4 % — AB (ref 36.0–46.0)
Hemoglobin: 11.6 g/dL — ABNORMAL LOW (ref 12.0–15.0)
MCH: 32 pg (ref 26.0–34.0)
MCHC: 33.7 g/dL (ref 30.0–36.0)
MCV: 95 fL (ref 78.0–100.0)
Platelets: 301 10*3/uL (ref 150–400)
RBC: 3.62 MIL/uL — AB (ref 3.87–5.11)
RDW: 13.7 % (ref 11.5–15.5)
WBC: 13.9 10*3/uL — AB (ref 4.0–10.5)

## 2016-02-10 LAB — URINALYSIS, ROUTINE W REFLEX MICROSCOPIC
Bilirubin Urine: NEGATIVE
Glucose, UA: NEGATIVE mg/dL
Hgb urine dipstick: NEGATIVE
Ketones, ur: NEGATIVE mg/dL
LEUKOCYTES UA: NEGATIVE
Nitrite: NEGATIVE
PROTEIN: NEGATIVE mg/dL
SPECIFIC GRAVITY, URINE: 1.015 (ref 1.005–1.030)
pH: 8 (ref 5.0–8.0)

## 2016-02-10 LAB — TYPE AND SCREEN
ABO/RH(D): A POS
Antibody Screen: NEGATIVE

## 2016-02-11 ENCOUNTER — Inpatient Hospital Stay (HOSPITAL_COMMUNITY): Payer: Medicaid Other | Admitting: Anesthesiology

## 2016-02-11 ENCOUNTER — Inpatient Hospital Stay (HOSPITAL_COMMUNITY)
Admission: RE | Admit: 2016-02-11 | Discharge: 2016-02-13 | DRG: 766 | Disposition: A | Discharge status: Home / Self Care | Payer: Medicaid Other | Source: Ambulatory Visit | Attending: Obstetrics and Gynecology | Admitting: Obstetrics and Gynecology

## 2016-02-11 ENCOUNTER — Encounter (HOSPITAL_COMMUNITY)
Admission: RE | Disposition: A | Discharge status: Home / Self Care | Payer: Self-pay | Source: Ambulatory Visit | Attending: Family Medicine

## 2016-02-11 ENCOUNTER — Encounter (HOSPITAL_COMMUNITY): Payer: Self-pay

## 2016-02-11 DIAGNOSIS — Z3A39 39 weeks gestation of pregnancy: Secondary | ICD-10-CM

## 2016-02-11 DIAGNOSIS — F17210 Nicotine dependence, cigarettes, uncomplicated: Secondary | ICD-10-CM | POA: Diagnosis present

## 2016-02-11 DIAGNOSIS — O34211 Maternal care for low transverse scar from previous cesarean delivery: Principal | ICD-10-CM | POA: Diagnosis present

## 2016-02-11 DIAGNOSIS — Z302 Encounter for sterilization: Secondary | ICD-10-CM | POA: Diagnosis not present

## 2016-02-11 DIAGNOSIS — O99334 Smoking (tobacco) complicating childbirth: Secondary | ICD-10-CM | POA: Diagnosis present

## 2016-02-11 DIAGNOSIS — Z98891 History of uterine scar from previous surgery: Secondary | ICD-10-CM

## 2016-02-11 LAB — URINALYSIS, ROUTINE W REFLEX MICROSCOPIC
BILIRUBIN URINE: NEGATIVE
Glucose, UA: NEGATIVE mg/dL
Hgb urine dipstick: NEGATIVE
Ketones, ur: >80 mg/dL — AB
LEUKOCYTES UA: NEGATIVE
NITRITE: NEGATIVE
PH: 6 (ref 5.0–8.0)
Protein, ur: NEGATIVE mg/dL
SPECIFIC GRAVITY, URINE: 1.025 (ref 1.005–1.030)

## 2016-02-11 LAB — RAPID URINE DRUG SCREEN, HOSP PERFORMED
AMPHETAMINES: NOT DETECTED
BARBITURATES: NOT DETECTED
BENZODIAZEPINES: NOT DETECTED
COCAINE: NOT DETECTED
OPIATES: POSITIVE — AB
TETRAHYDROCANNABINOL: NOT DETECTED

## 2016-02-11 LAB — RPR: RPR Ser Ql: NONREACTIVE

## 2016-02-11 SURGERY — Surgical Case
Anesthesia: Spinal | Site: Abdomen | Laterality: Bilateral

## 2016-02-11 MED ORDER — DIPHENHYDRAMINE HCL 25 MG PO CAPS: 25.0000 mg | ORAL_CAPSULE | ORAL | Status: DC | PRN

## 2016-02-11 MED ORDER — PNEUMOCOCCAL VAC POLYVALENT 25 MCG/0.5ML IJ INJ
0.5000 mL | INJECTION | INTRAMUSCULAR | Status: AC
  Administered 2016-02-13: 0.5 mL via INTRAMUSCULAR
  Filled 2016-02-11: qty 0.5

## 2016-02-11 MED ORDER — MENTHOL 3 MG MT LOZG: 1.0000 | LOZENGE | OROMUCOSAL | Status: DC | PRN

## 2016-02-11 MED ORDER — SIMETHICONE 80 MG PO CHEW
80.0000 mg | CHEWABLE_TABLET | ORAL | Status: DC | PRN
  Administered 2016-02-12: 80 mg via ORAL
  Filled 2016-02-11: qty 1

## 2016-02-11 MED ORDER — CEFAZOLIN SODIUM-DEXTROSE 2-3 GM-% IV SOLR: 2.0000 g | INTRAVENOUS | Status: DC

## 2016-02-11 MED ORDER — FENTANYL CITRATE (PF) 100 MCG/2ML IJ SOLN
50.0000 ug | INTRAMUSCULAR | Status: DC | PRN
  Administered 2016-02-11: 50 ug via INTRAVENOUS
  Filled 2016-02-11: qty 2

## 2016-02-11 MED ORDER — NALBUPHINE HCL 10 MG/ML IJ SOLN: 5.0000 mg | Freq: Once | INTRAMUSCULAR | Status: DC | PRN

## 2016-02-11 MED ORDER — LACTATED RINGERS IV SOLN
INTRAVENOUS | Status: DC
  Administered 2016-02-11 (×3): via INTRAVENOUS

## 2016-02-11 MED ORDER — SCOPOLAMINE 1 MG/3DAYS TD PT72
1.0000 | MEDICATED_PATCH | Freq: Once | TRANSDERMAL | Status: DC
  Administered 2016-02-11: 1.5 mg via TRANSDERMAL

## 2016-02-11 MED ORDER — DIBUCAINE 1 % RE OINT: 1.0000 "application " | TOPICAL_OINTMENT | RECTAL | Status: DC | PRN

## 2016-02-11 MED ORDER — KETOROLAC TROMETHAMINE 30 MG/ML IJ SOLN
30.0000 mg | Freq: Four times a day (QID) | INTRAMUSCULAR | Status: AC | PRN
  Administered 2016-02-11: 30 mg via INTRAVENOUS

## 2016-02-11 MED ORDER — NALOXONE HCL 2 MG/2ML IJ SOSY: 1.0000 ug/kg/h | PREFILLED_SYRINGE | INTRAVENOUS | Status: DC | PRN

## 2016-02-11 MED ORDER — LACTATED RINGERS IV SOLN
INTRAVENOUS | Status: DC
Start: 2016-02-11 — End: 2016-02-14
  Administered 2016-02-12: 04:00:00 via INTRAVENOUS

## 2016-02-11 MED ORDER — ACETAMINOPHEN 325 MG PO TABS: 650.0000 mg | ORAL_TABLET | ORAL | Status: DC | PRN

## 2016-02-11 MED ORDER — SCOPOLAMINE 1 MG/3DAYS TD PT72: 1.0000 | MEDICATED_PATCH | Freq: Once | TRANSDERMAL | Status: DC

## 2016-02-11 MED ORDER — LANOLIN HYDROUS EX OINT: 1.0000 "application " | TOPICAL_OINTMENT | CUTANEOUS | Status: DC | PRN

## 2016-02-11 MED ORDER — KETOROLAC TROMETHAMINE 30 MG/ML IJ SOLN: 30.0000 mg | Freq: Four times a day (QID) | INTRAMUSCULAR | Status: AC | PRN

## 2016-02-11 MED ORDER — LACTATED RINGERS IV SOLN
Freq: Once | INTRAVENOUS | Status: AC
  Administered 2016-02-11: 1000 mL/h via INTRAVENOUS

## 2016-02-11 MED ORDER — PHENYLEPHRINE 40 MCG/ML (10ML) SYRINGE FOR IV PUSH (FOR BLOOD PRESSURE SUPPORT)
PREFILLED_SYRINGE | INTRAVENOUS | Status: AC
  Filled 2016-02-11: qty 10

## 2016-02-11 MED ORDER — CITALOPRAM HYDROBROMIDE 10 MG PO TABS
10.0000 mg | ORAL_TABLET | Freq: Every day | ORAL | Status: DC
  Administered 2016-02-12: 10 mg via ORAL
  Filled 2016-02-11 (×3): qty 1

## 2016-02-11 MED ORDER — ZOLPIDEM TARTRATE 5 MG PO TABS: 5.0000 mg | ORAL_TABLET | Freq: Every evening | ORAL | Status: DC | PRN

## 2016-02-11 MED ORDER — SIMETHICONE 80 MG PO CHEW
80.0000 mg | CHEWABLE_TABLET | ORAL | Status: DC
  Administered 2016-02-11 – 2016-02-12 (×2): 80 mg via ORAL
  Filled 2016-02-11 (×2): qty 1

## 2016-02-11 MED ORDER — CEFAZOLIN SODIUM-DEXTROSE 2-3 GM-% IV SOLR
2.0000 g | INTRAVENOUS | Status: AC
  Administered 2016-02-11: 2 g via INTRAVENOUS

## 2016-02-11 MED ORDER — FENTANYL CITRATE (PF) 100 MCG/2ML IJ SOLN: 25.0000 ug | INTRAMUSCULAR | Status: DC | PRN

## 2016-02-11 MED ORDER — ONDANSETRON HCL 4 MG/2ML IJ SOLN
INTRAMUSCULAR | Status: DC | PRN
Start: 2016-02-11 — End: 2016-02-11
  Administered 2016-02-11: 4 mg via INTRAVENOUS

## 2016-02-11 MED ORDER — SIMETHICONE 80 MG PO CHEW
80.0000 mg | CHEWABLE_TABLET | Freq: Three times a day (TID) | ORAL | Status: DC
  Administered 2016-02-12 (×2): 80 mg via ORAL
  Filled 2016-02-11 (×2): qty 1

## 2016-02-11 MED ORDER — PHENYLEPHRINE 8 MG IN D5W 100 ML (0.08MG/ML) PREMIX OPTIME
INJECTION | INTRAVENOUS | Status: AC
  Filled 2016-02-11: qty 100

## 2016-02-11 MED ORDER — NALBUPHINE HCL 10 MG/ML IJ SOLN: 5.0000 mg | INTRAMUSCULAR | Status: DC | PRN

## 2016-02-11 MED ORDER — MORPHINE SULFATE (PF) 0.5 MG/ML IJ SOLN
INTRAMUSCULAR | Status: AC
  Filled 2016-02-11: qty 10

## 2016-02-11 MED ORDER — TETANUS-DIPHTH-ACELL PERTUSSIS 5-2.5-18.5 LF-MCG/0.5 IM SUSP
0.5000 mL | Freq: Once | INTRAMUSCULAR | Status: AC
  Administered 2016-02-12: 0.5 mL via INTRAMUSCULAR
  Filled 2016-02-11: qty 0.5

## 2016-02-11 MED ORDER — WITCH HAZEL-GLYCERIN EX PADS: 1.0000 "application " | MEDICATED_PAD | CUTANEOUS | Status: DC | PRN

## 2016-02-11 MED ORDER — DIPHENHYDRAMINE HCL 50 MG/ML IJ SOLN: 12.5000 mg | INTRAMUSCULAR | Status: DC | PRN

## 2016-02-11 MED ORDER — SODIUM CHLORIDE 0.9% FLUSH: 3.0000 mL | INTRAVENOUS | Status: DC | PRN

## 2016-02-11 MED ORDER — OXYTOCIN 10 UNIT/ML IJ SOLN: 2.5000 [IU]/h | INTRAVENOUS | Status: AC

## 2016-02-11 MED ORDER — SCOPOLAMINE 1 MG/3DAYS TD PT72
MEDICATED_PATCH | TRANSDERMAL | Status: DC
Start: 2016-02-11 — End: 2016-02-14
  Administered 2016-02-11: 1.5 mg via TRANSDERMAL
  Filled 2016-02-11: qty 1

## 2016-02-11 MED ORDER — NALOXONE HCL 0.4 MG/ML IJ SOLN: 0.4000 mg | INTRAMUSCULAR | Status: DC | PRN

## 2016-02-11 MED ORDER — FENTANYL CITRATE (PF) 100 MCG/2ML IJ SOLN
INTRAMUSCULAR | Status: AC
  Filled 2016-02-11: qty 2

## 2016-02-11 MED ORDER — PRENATAL MULTIVITAMIN CH
1.0000 | ORAL_TABLET | Freq: Every day | ORAL | Status: DC
  Administered 2016-02-12: 1 via ORAL
  Filled 2016-02-11: qty 1

## 2016-02-11 MED ORDER — MEPERIDINE HCL 25 MG/ML IJ SOLN: 6.2500 mg | INTRAMUSCULAR | Status: DC | PRN

## 2016-02-11 MED ORDER — CEFAZOLIN SODIUM-DEXTROSE 2-3 GM-% IV SOLR
INTRAVENOUS | Status: AC
  Filled 2016-02-11: qty 50

## 2016-02-11 MED ORDER — ONDANSETRON HCL 4 MG/2ML IJ SOLN: 4.0000 mg | Freq: Three times a day (TID) | INTRAMUSCULAR | Status: DC | PRN

## 2016-02-11 MED ORDER — OXYTOCIN 10 UNIT/ML IJ SOLN
INTRAMUSCULAR | Status: AC
  Filled 2016-02-11: qty 4

## 2016-02-11 MED ORDER — ACETAMINOPHEN 10 MG/ML IV SOLN
1000.0000 mg | Freq: Once | INTRAVENOUS | Status: AC
  Administered 2016-02-11: 1000 mg via INTRAVENOUS
  Filled 2016-02-11: qty 100

## 2016-02-11 MED ORDER — LACTATED RINGERS IV SOLN
INTRAVENOUS | Status: DC | PRN
  Administered 2016-02-11 (×2): via INTRAVENOUS

## 2016-02-11 MED ORDER — SENNOSIDES-DOCUSATE SODIUM 8.6-50 MG PO TABS
2.0000 | ORAL_TABLET | ORAL | Status: DC
  Administered 2016-02-11 – 2016-02-12 (×2): 2 via ORAL
  Filled 2016-02-11 (×2): qty 2

## 2016-02-11 MED ORDER — ACETAMINOPHEN 500 MG PO TABS
1000.0000 mg | ORAL_TABLET | Freq: Four times a day (QID) | ORAL | Status: AC
  Administered 2016-02-12 (×2): 1000 mg via ORAL
  Filled 2016-02-11 (×3): qty 2

## 2016-02-11 MED ORDER — DIPHENHYDRAMINE HCL 25 MG PO CAPS: 25.0000 mg | ORAL_CAPSULE | Freq: Four times a day (QID) | ORAL | Status: DC | PRN

## 2016-02-11 MED ORDER — PHENYLEPHRINE 8 MG IN D5W 100 ML (0.08MG/ML) PREMIX OPTIME
INJECTION | INTRAVENOUS | Status: DC | PRN
  Administered 2016-02-11: 60 ug/min via INTRAVENOUS

## 2016-02-11 MED ORDER — PHENYLEPHRINE HCL 10 MG/ML IJ SOLN
INTRAMUSCULAR | Status: DC | PRN
  Administered 2016-02-11 (×2): 40 ug via INTRAVENOUS
  Administered 2016-02-11: 80 ug via INTRAVENOUS
  Administered 2016-02-11: 40 ug via INTRAVENOUS

## 2016-02-11 MED ORDER — LACTATED RINGERS IV SOLN
40.0000 [IU] | INTRAVENOUS | Status: DC | PRN
Start: 2016-02-11 — End: 2016-02-11
  Administered 2016-02-11: 40 [IU] via INTRAVENOUS

## 2016-02-11 MED ORDER — SODIUM CHLORIDE 0.9 % IR SOLN
Status: DC | PRN
  Administered 2016-02-11: 1000 mL

## 2016-02-11 MED ORDER — ONDANSETRON HCL 4 MG/2ML IJ SOLN: 4.0000 mg | Freq: Once | INTRAMUSCULAR | Status: DC | PRN

## 2016-02-11 MED ORDER — KETOROLAC TROMETHAMINE 30 MG/ML IJ SOLN
INTRAMUSCULAR | Status: AC
  Administered 2016-02-11: 30 mg via INTRAVENOUS
  Filled 2016-02-11: qty 1

## 2016-02-11 MED ORDER — IBUPROFEN 600 MG PO TABS
600.0000 mg | ORAL_TABLET | Freq: Four times a day (QID) | ORAL | Status: DC
  Administered 2016-02-11 – 2016-02-13 (×7): 600 mg via ORAL
  Filled 2016-02-11 (×7): qty 1

## 2016-02-11 MED ORDER — ONDANSETRON HCL 4 MG/2ML IJ SOLN
INTRAMUSCULAR | Status: AC
  Filled 2016-02-11: qty 2

## 2016-02-11 SURGICAL SUPPLY — 39 items
APL SKNCLS STERI-STRIP NONHPOA (GAUZE/BANDAGES/DRESSINGS) ×1
BENZOIN TINCTURE PRP APPL 2/3 (GAUZE/BANDAGES/DRESSINGS) ×1 IMPLANT
CLAMP CORD UMBIL (MISCELLANEOUS) IMPLANT
CLIP FILSHIE TUBAL LIGA STRL (Clip) ×2 IMPLANT
CLOSURE STERI STRIP 1/2 X4 (GAUZE/BANDAGES/DRESSINGS) ×1 IMPLANT
CLOTH BEACON ORANGE TIMEOUT ST (SAFETY) ×2 IMPLANT
DRSG OPSITE POSTOP 4X10 (GAUZE/BANDAGES/DRESSINGS) ×2 IMPLANT
DURAPREP 26ML APPLICATOR (WOUND CARE) ×2 IMPLANT
ELECT REM PT RETURN 9FT ADLT (ELECTROSURGICAL) ×2
ELECTRODE REM PT RTRN 9FT ADLT (ELECTROSURGICAL) ×1 IMPLANT
EXTRACTOR VACUUM KIWI (MISCELLANEOUS) ×1 IMPLANT
GLOVE BIO SURGEON ST LM GN SZ9 (GLOVE) ×2 IMPLANT
GLOVE BIOGEL PI IND STRL 7.0 (GLOVE) ×1 IMPLANT
GLOVE BIOGEL PI IND STRL 9 (GLOVE) ×1 IMPLANT
GLOVE BIOGEL PI INDICATOR 7.0 (GLOVE) ×1
GLOVE BIOGEL PI INDICATOR 9 (GLOVE) ×1
GOWN STRL REUS W/TWL 2XL LVL3 (GOWN DISPOSABLE) ×2 IMPLANT
GOWN STRL REUS W/TWL LRG LVL3 (GOWN DISPOSABLE) ×4 IMPLANT
NDL HYPO 25X5/8 SAFETYGLIDE (NEEDLE) IMPLANT
NEEDLE HYPO 25X5/8 SAFETYGLIDE (NEEDLE) IMPLANT
NS IRRIG 1000ML POUR BTL (IV SOLUTION) ×2 IMPLANT
PACK C SECTION WH (CUSTOM PROCEDURE TRAY) ×2 IMPLANT
PAD ABD 7.5X8 STRL (GAUZE/BANDAGES/DRESSINGS) ×1 IMPLANT
PAD OB MATERNITY 4.3X12.25 (PERSONAL CARE ITEMS) ×2 IMPLANT
PENCIL SMOKE EVAC W/HOLSTER (ELECTROSURGICAL) ×2 IMPLANT
RTRCTR C-SECT PINK 25CM LRG (MISCELLANEOUS) IMPLANT
RTRCTR C-SECT PINK 34CM XLRG (MISCELLANEOUS) IMPLANT
SPONGE GAUZE 4X4 12PLY STER LF (GAUZE/BANDAGES/DRESSINGS) ×2 IMPLANT
STRIP CLOSURE SKIN 1/2X4 (GAUZE/BANDAGES/DRESSINGS) IMPLANT
SUT MNCRL 0 VIOLET CTX 36 (SUTURE) ×2 IMPLANT
SUT MONOCRYL 0 CTX 36 (SUTURE) ×2
SUT VIC AB 0 CT1 27 (SUTURE) ×2
SUT VIC AB 0 CT1 27XBRD ANBCTR (SUTURE) ×1 IMPLANT
SUT VIC AB 2-0 CT1 27 (SUTURE) ×4
SUT VIC AB 2-0 CT1 TAPERPNT 27 (SUTURE) ×2 IMPLANT
SUT VIC AB 4-0 KS 27 (SUTURE) ×2 IMPLANT
SYR BULB IRRIGATION 50ML (SYRINGE) IMPLANT
TOWEL OR 17X24 6PK STRL BLUE (TOWEL DISPOSABLE) ×2 IMPLANT
TRAY FOLEY CATH SILVER 14FR (SET/KITS/TRAYS/PACK) ×2 IMPLANT

## 2016-02-11 NOTE — Transfer of Care (Signed)
Immediate Anesthesia Transfer of Care Note  Patient: Pamela [name]  Procedure(s) Performed: Procedure(s): REPEAT CESAREAN SECTION WITH BILATERAL TUBAL LIGATION (Bilateral)  Patient Location: PACU  Anesthesia Type:Spinal  Level of Consciousness: awake  Airway & Oxygen Therapy: Patient Spontanous Breathing  Post-op Assessment: Report given to RN  Post vital signs: Reviewed and stable  Last Vitals:  Filed Vitals:   02/11/16 1403  BP: 133/72  Pulse: 112  Temp: 36.2 C  Resp: 16    Complications: No apparent anesthesia complications

## 2016-02-11 NOTE — Consult Note (Signed)
Neonatology Note:   Attendance at C-section:    I was asked by Dr. Emelda FearFerguson to attend this repeat C/S at term. The mother is a G8P4A3 A pos, GBS not done with positive UDS during pregnancy (opiates several times, amphetamines once: but patient on a cough medication containing and opioid) . ROM at delivery, fluid with thin meconium. Delayed cord clamping was done, during which baby was moving and cried a few times. Infant pale, dusky, and with irregular respiratory effort at 1 minute. We did bulb suctioning for clear fluids several times and gave stimulation. She responded with intermittent cry, but had some rales on auscultation. We did DeLee suctioning for 12 ml of light green, non-particulate fluid. The baby had diminished tone and remained dusky at 5 minutes. We performed chest PT and gave BBO2 at 5 minutes, with improvement; the breath sounds were clear and deeper, and respirations became regular. Perfusion was excellent, with good cap refill. We weaned the baby gradually off BBO2 and the O2 saturations were about 90-92% in room air. She was seen briefly by her mother, but did not maintain adequate saturations in room air (dropped to 82%), so was given BBO2 again, then was taken to the nursery to be observed closely during transition. Ap 7/6/9. The baby was not in distress. Her father accompanied her to CN. To CN to care of Pediatrician.   Doretha Souhristie C. Keitha Kolk, MD

## 2016-02-11 NOTE — Op Note (Signed)
Cesarean Section Operative Report  Pamela [name]  02/11/2016  Indications: Scheduled Proceedure/Maternal Request   Pre-operative Diagnosis: pregnancy 39 wk, prior cesarean x 2, desire for permanent sterilization.   Post-operative Diagnosis: Same   Surgeon: Surgeon(s) and Role:    * Kathrynn RunningNoah Bedford Wouk, MD - Assisting    * Tilda BurrowJohn Jeremy Mclamb V, MD - Primary   Attending Attestation: I was present and scrubbed for the entire procedure.   Assistants: Lucretia KernJohn Diehl, medical student  Anesthesia: spinal    Estimated Blood Loss: 700 ml  Total IV Fluids: 3100 ml LR  Urine Output:: 100 ml clear yellow urine  Specimens: none  Findings: Viable female infant in cephalic presentation; Apgars 7/6; weight 3255 g; arterial cord pH not obtained;light meconium-stained amniotic fluid; intact placenta with three vessel cord; normal uterus, fallopian tubes and ovaries bilaterally. Mild amount adhesive disease lower uterine segment.  Baby condition / location:  Couplet care / Skin to Skin   Complications: no complications  Indications: Pamela NgKeisha S [name] is a 35 y.o. [id number] with an IUP [id number] presenting for elective repeat cesarean section.  The risks, benefits, complications, treatment options, and expected outcomes were discussed with the patient . The patient concurred with the proposed plan, giving informed consent. identified as Pamela [name] and the procedure verified as C-Section Delivery.  Procedure Details:  The patient was taken back to the operative suite where spinal anesthesia was placed.  A time out was held and the above information confirmed.   After induction of anesthesia, the patient was draped and prepped in the usual sterile manner and placed in a dorsal supine position with a leftward tilt. An elliptical incision was made, the lower portion of which constituted a Pfannenstiel incision, and that elliptical portion of tissue was excised. The incision was then carried down through  the subcutaneous tissue to the fascia. Fascial incision was made and sharply extended transversely. The fascia was separated from the underlying rectus tissue superiorly and inferiorly. The peritoneum was identified and sharply entered and extended longitudinally. Bladder blade was placed. Bladder flap was created and one adhesion was taken down. A low transverse uterine incision was made and extended bluntly. Delivered from cephalic presentation was a viable infant with Apgars and weight as above. The umbilical cord was clamped and cut cord blood was obtained for evaluation. Cord ph was not sent. The placenta was removed Intact and appeared normal. The uterus was exteriorized. The uterine outline, tubes and ovaries appeared normal. The uterine incision was closed with running locked sutures of 0Monocryl. No imbricating layer. The Fallopian tubes were identified bilaterally.  A Filshie clip was placed on each tube without difficulty 3 cm from the cornua.  There was no bleeding. Hemostasis was observed. The peritoneum was closed with 0 vicryl. The rectus muscles were examined and hemostasis observed. The fascia was then reapproximated with running sutures of 0Vicryl.  subcuticular closure with 2-0 vicryl horizontal mattress sutures x 3. The skin was closed with 4-0Vicryl.   Instrument, sponge, and needle counts were correct prior the abdominal closure and were correct at the conclusion of the case.     Disposition: PACU - hemodynamically stable.   Maternal Condition: stable       Signed: Lavonne Chickoah B WoukMD 02/11/2016 4:36 PM

## 2016-02-11 NOTE — Anesthesia Preprocedure Evaluation (Addendum)
Anesthesia Evaluation  Patient identified by MRN, date of birth, ID band Patient awake    Reviewed: Allergy & Precautions, NPO status , Patient's Chart, lab work & pertinent test results  History of Anesthesia Complications Negative for: history of anesthetic complications  Airway Mallampati: II  TM Distance: >3 FB Neck ROM: Full    Dental no notable dental hx. (+) Dental Advisory Given   Pulmonary Current Smoker,    Pulmonary exam normal breath sounds clear to auscultation       Cardiovascular negative cardio ROS Normal cardiovascular exam Rhythm:Regular Rate:Normal     Neuro/Psych  Headaches, PSYCHIATRIC DISORDERS Anxiety Depression    GI/Hepatic negative GI ROS, (+)     substance abuse (hx of substance abuse, reports no illicit drugs in 5 years, off suboxone, does take opioids during this pregnancy for sciatric and contraction pain)  ,   Endo/Other  negative endocrine ROS  Renal/GU negative Renal ROS  negative genitourinary   Musculoskeletal negative musculoskeletal ROS (+)   Abdominal   Peds negative pediatric ROS (+)  Hematology negative hematology ROS (+)   Anesthesia Other Findings   Reproductive/Obstetrics (+) Pregnancy                            Anesthesia Physical Anesthesia Plan  ASA: II  Anesthesia Plan: Spinal   Post-op Pain Management:    Induction:   Airway Management Planned:   Additional Equipment:   Intra-op Plan:   Post-operative Plan:   Informed Consent: I have reviewed the patients History and Physical, chart, labs and discussed the procedure including the risks, benefits and alternatives for the proposed anesthesia with the patient or authorized representative who has indicated his/her understanding and acceptance.   Dental advisory given  Plan Discussed with: CRNA  Anesthesia Plan Comments:         Anesthesia Quick Evaluation

## 2016-02-11 NOTE — H&P (Signed)
  Tilda BurrowJohn Kolbie Lepkowski V, MD Physician Signed Obstetrics/Gynecology H&P 02/06/2016 12:05 PM  Related encounter: [facility]    Expand All Collapse All   Pamela [name] is a 35 y.o. female being admitted for Repeat Cesarean section and tubal ligation by filshie clips. She declined TOLAC, after being offered. She has had 2 spontaneous vaginal deliveries, then 2 cesareans, one for fetal macrosomia, then repeat cesarean. She desires permanent sterilization, and has signed medicaid sterilization forms 11/19/15. She reaffirms desire for permanent sterilization at this time. History  Ob course has been notable for UDS's positive for opiates at times when no Opiate Rx was involved in her care.Recently she alleges a fall while walking her dog, and that she has been taking some Hycodan for cough, and inability to sleep , so UDS now would be positive. There have been no other abuse agents on several uds's. OB History    Gravida Para Term Preterm AB TAB SAB Ectopic Multiple Living   8 4 4  3 3    4      Past Medical History  Diagnosis Date  . Abnormal vaginal Pap smear   . Vaginal Pap smear, abnormal   . Migraines   . Pregnant 06/19/2015  . Depression    Past Surgical History  Procedure Laterality Date  . Cesarean section  x 2  . Back surgery    . Tonsillectomy and adenoidectomy     Family History: family history includes Dementia in her paternal grandfather and paternal grandmother. Social History:  reports that she has been smoking Cigarettes. She started smoking about 8 years ago. She has a 9.5 pack-year smoking history. She has never used smokeless tobacco. She reports that she does not drink alcohol or use illicit drugs.   Prenatal Transfer Tool  Maternal Diabetes: No Genetic Screening: Normal Maternal Ultrasounds/Referrals: Normal Fetal Ultrasounds or other Referrals: None Maternal Substance  Abuse: Yes: Type: Prescription drugs  Significant Maternal Medications: on Hycodan recently Other Comments: None  ROS Recent fall WITH negative monitoring of baby   Last menstrual period 05/05/2015. Exam Physical Exam  Prenatal labs: ABO, Rh: A/Positive/-- (08/09 1600) Antibody: Negative (12/13 0916) Rubella: 1.43 (08/09 1600) RPR: Non Reactive (12/13 0916)  HBsAg: Negative (08/09 1600)  HIV: Non Reactive (12/13 0916)  GBS:     Assessment/Plan: Pregnancy 39 wk , prior cesarean x 2 declining tolac, for repeat cesarean and tubal ligation. Hx positive uds's for opiates.  Pamela [name] Pamela [name] 02/06/2016, 12:05 PM

## 2016-02-12 ENCOUNTER — Encounter (HOSPITAL_COMMUNITY): Payer: Self-pay | Admitting: Obstetrics and Gynecology

## 2016-02-12 LAB — CBC
HCT: 30.9 % — ABNORMAL LOW (ref 36.0–46.0)
Hemoglobin: 10.5 g/dL — ABNORMAL LOW (ref 12.0–15.0)
MCH: 32 pg (ref 26.0–34.0)
MCHC: 34 g/dL (ref 30.0–36.0)
MCV: 94.2 fL (ref 78.0–100.0)
PLATELETS: 269 10*3/uL (ref 150–400)
RBC: 3.28 MIL/uL — AB (ref 3.87–5.11)
RDW: 13.7 % (ref 11.5–15.5)
WBC: 16.4 10*3/uL — ABNORMAL HIGH (ref 4.0–10.5)

## 2016-02-12 MED ORDER — FENTANYL CITRATE (PF) 100 MCG/2ML IJ SOLN
INTRAMUSCULAR | Status: DC | PRN
  Administered 2016-02-11: 10 ug via INTRATHECAL

## 2016-02-12 MED ORDER — BUPIVACAINE IN DEXTROSE 0.75-8.25 % IT SOLN
INTRATHECAL | Status: DC | PRN
  Administered 2016-02-11: 1.6 mg via INTRATHECAL

## 2016-02-12 MED ORDER — OXYCODONE-ACETAMINOPHEN 5-325 MG PO TABS
1.0000 | ORAL_TABLET | Freq: Four times a day (QID) | ORAL | Status: DC | PRN
  Administered 2016-02-12 – 2016-02-13 (×5): 1 via ORAL
  Filled 2016-02-12 (×6): qty 1

## 2016-02-12 MED ORDER — OXYCODONE-ACETAMINOPHEN 5-325 MG PO TABS
2.0000 | ORAL_TABLET | Freq: Once | ORAL | Status: AC
  Administered 2016-02-12: 2 via ORAL
  Filled 2016-02-12: qty 2

## 2016-02-12 MED ORDER — MORPHINE SULFATE (PF) 0.5 MG/ML IJ SOLN
INTRAMUSCULAR | Status: DC | PRN
  Administered 2016-02-11: .2 mg via INTRATHECAL

## 2016-02-12 NOTE — Progress Notes (Signed)
CSW acknowledges consult and attempted to meet with MOB to complete assessment. MOB was sleeping soundly.   CSW to follow up on 3/9.  

## 2016-02-12 NOTE — Progress Notes (Signed)
POSTPARTUM PROGRESS NOTE  Post Partum Day 1 Subjective:  Pamela [name] is a 35 y.o. [id number] 875w1d s/p rltcs/btl.  No acute events overnight.  Pt denies problems with ambulating, voiding or po intake.  She denies nausea or vomiting.  Pain is well controlled.  She has had flatus. She has not had bowel movement.  Lochia Small.   Objective: Blood pressure 100/53, pulse 66, temperature 98.3 F (36.8 C), temperature source Oral, resp. rate 16, last menstrual period 05/05/2015, SpO2 97 %, unknown if currently breastfeeding.  Physical Exam:  General: alert, cooperative and no distress Lochia:normal flow Chest: CTAB Heart: RRR no m/r/g Abdomen: +BS, soft, nontender, pressure bandage in place w/o evidence of bleeding Uterine Fundus: firm,  DVT Evaluation: No calf swelling or tenderness Extremities: trace edema   Recent Labs  02/10/16 1155 02/12/16 0537  HGB 11.6* 10.5*  HCT 34.4* 30.9*    Assessment/Plan:  ASSESSMENT: Pamela [name] is a 35 y.o. [id number] 255w1d s/p rltcs/btl, doing well. Plan for d/c pod 2 or 3    LOS: 1 day   Silvano Bilisoah B Drea Jurewicz 02/12/2016, 9:12 AM

## 2016-02-12 NOTE — Anesthesia Postprocedure Evaluation (Signed)
Anesthesia Post Note  Patient: Pamela [name]  Procedure(s) Performed: Procedure(s) (LRB): REPEAT CESAREAN SECTION WITH BILATERAL TUBAL LIGATION (Bilateral)  Patient location during evaluation: Mother Baby Anesthesia Type: Spinal Level of consciousness: awake, awake and alert, oriented and patient cooperative Pain management: pain level controlled Vital Signs Assessment: post-procedure vital signs reviewed and stable Respiratory status: spontaneous breathing, nonlabored ventilation and respiratory function stable Cardiovascular status: stable Postop Assessment: no headache, no backache, patient able to bend at knees and no signs of nausea or vomiting Anesthetic complications: no    Last Vitals:  Filed Vitals:   02/12/16 0530 02/12/16 0930  BP: 100/53 106/60  Pulse: 66 67  Temp: 36.8 C 36.7 C  Resp: 16 18    Last Pain:  Filed Vitals:   02/12/16 1138  PainSc: 8                  Meredith Kilbride L

## 2016-02-12 NOTE — Lactation Note (Signed)
This note was copied from a baby's chart. Lactation Consultation Note  Mom states breastfeeding is going well.  Instructed to feed with any feeding cue and call for concerns/assist prn.  Patient Name: Pamela [name] ZOXWR'UToday's Date: 02/12/2016 Reason for consult: Initial assessment   Maternal Data Formula Feeding for Exclusion: Yes Reason for exclusion: Substance abuse and/or alcohol abuse  Feeding Feeding Type: Breast Fed  LATCH Score/Interventions Latch: Grasps breast easily, tongue down, lips flanged, rhythmical sucking.  Audible Swallowing: A few with stimulation  Type of Nipple: Everted at rest and after stimulation  Comfort (Breast/Nipple): Soft / non-tender     Hold (Positioning): No assistance needed to correctly position infant at breast.  LATCH Score: 9  Lactation Tools Discussed/Used     Consult Status Consult Status: PRN    Huston FoleyMOULDEN, [place] Zapien S 02/12/2016, 11:38 AM

## 2016-02-12 NOTE — Anesthesia Procedure Notes (Signed)
Spinal Patient location during procedure: OR Staffing Anesthesiologist: Laurier Jasperson Performed by: anesthesiologist  Preanesthetic Checklist Completed: patient identified, site marked, surgical consent, pre-op evaluation, timeout performed, IV checked, risks and benefits discussed and monitors and equipment checked Spinal Block Patient position: sitting Prep: ChloraPrep Patient monitoring: continuous pulse ox, blood pressure and heart rate Approach: midline Injection technique: single-shot Needle Needle type: Pencan  Needle gauge: 24 G Needle length: 9 cm Additional Notes Functioning IV was confirmed and monitors were applied. Sterile prep and drape, including hand hygiene, mask and sterile gloves were used. The patient was positioned and the spine was prepped. The skin was anesthetized with lidocaine.  Free flow of clear CSF was obtained prior to injecting local anesthetic into the CSF.  The spinal needle aspirated freely following injection.  The needle was carefully withdrawn.  The patient tolerated the procedure well. Consent was obtained prior to procedure with all questions answered and concerns addressed. Risks including but not limited to bleeding, infection, nerve damage, paralysis, failed block, inadequate analgesia, allergic reaction, high spinal, itching and headache were discussed and the patient wished to proceed.   Karie SchwalbeMary Addalynne Golding, MD

## 2016-02-12 NOTE — Anesthesia Postprocedure Evaluation (Signed)
Anesthesia Post Note  Patient: Christianne BorrowKeisha S Buttacavoli  Procedure(s) Performed: Procedure(s) (LRB): REPEAT CESAREAN SECTION WITH BILATERAL TUBAL LIGATION (Bilateral)  Patient location during evaluation: PACU Anesthesia Type: General Level of consciousness: awake and alert Pain management: pain level controlled Vital Signs Assessment: post-procedure vital signs reviewed and stable Respiratory status: spontaneous breathing, nonlabored ventilation, respiratory function stable and patient connected to nasal cannula oxygen Cardiovascular status: blood pressure returned to baseline and stable Postop Assessment: no signs of nausea or vomiting, patient able to bend at knees and spinal receding Anesthetic complications: no    Last Vitals:  Filed Vitals:   02/12/16 0530 02/12/16 0930  BP: 100/53 106/60  Pulse: 66 67  Temp: 36.8 C 36.7 C  Resp: 16 18    Last Pain:  Filed Vitals:   02/12/16 1138  PainSc: 8                  Allia Wiltsey JENNETTE

## 2016-02-12 NOTE — Progress Notes (Deleted)
Subjective: Postpartum Day 1: Cesarean Delivery  Patient reports incisional pain, tolerating PO, + flatus and no problems voiding.   Patient reports generalized abdominal pain unrelieved by tylenol and motrin. Patient states Percocet helped more. Patient reports ambulating without dizziness. [facility] Pt reports she is breastfeeding well.  Objective: Vital signs in last 24 hours: Temp:  [96.9 F (36.1 C)-98.6 F (37 C)] 98.3 F (36.8 C) (03/08 0530) Pulse Rate:  [45-112] 66 (03/08 0530) Resp:  [11-16] 16 (03/08 0530) BP: (97-133)/(41-72) 100/53 mmHg (03/08 0530) SpO2:  [96 %-100 %] 97 % (03/08 0530)   Repeat CBC shows Hg at 10.5 at 0537 today.   Physical Exam:  General: alert, cooperative, appears stated age and no distress Lochia: appropriate Uterine Fundus: firm, U 0 location. Some tenderness upon palpation but no guarding or rebound.  Incision: healing well, no significant drainage, no significant erythema DVT Evaluation: No evidence of DVT seen on physical exam. Negative Homan's sign. No cords or calf tenderness. No significant calf/ankle edema.   Recent Labs  02/10/16 1155 02/12/16 0537  HGB 11.6* 10.5*  HCT 34.4* 30.9*    Assessment/Plan: Status post Cesarean section. Doing well postoperatively.  Continue current care. Discuss Hg status with care team.   Frederich BaldingMorgan Holland-Payne 02/12/2016, 7:46 AM

## 2016-02-12 NOTE — Addendum Note (Signed)
Addendum  created 02/12/16 1322 by Yolonda KidaAlison L Candy Ziegler, CRNA   Modules edited: Clinical Notes   Clinical Notes:  File: [id number]

## 2016-02-13 MED ORDER — IBUPROFEN 600 MG PO TABS: 600.0000 mg | ORAL_TABLET | Freq: Four times a day (QID) | ORAL | Status: DC

## 2016-02-13 MED ORDER — OXYCODONE-ACETAMINOPHEN 5-325 MG PO TABS: 1.0000 | ORAL_TABLET | Freq: Four times a day (QID) | ORAL | Status: DC | PRN

## 2016-02-13 NOTE — Discharge Summary (Signed)
Obstetric Discharge Summary Reason for Admission: cesarean section and tubal ligation  Pamela [name] is a 35 y.o. female being admitted for Repeat Cesarean section and tubal ligation by filshie clips. She declined TOLAC, after being offered. She has had 2 spontaneous vaginal deliveries, then 2 cesareans, one for fetal macrosomia, then repeat cesarean. She desires permanent sterilization, and has signed medicaid sterilization forms 11/19/15. She reaffirms desire for permanent sterilization at this time. History  Ob course has been notable for UDS's positive for opiates at times when no Opiate Rx was involved in her care.Recently she alleges a fall while walking her dog, and that she has been taking some Hycodan for cough, and inability to sleep , so UDS now would be positive. There have been no other abuse agents on several uds's. The patient has seemed reliable in her recent conversations,    Prenatal Procedures: none Intrapartum Procedures: cesarean: low cervical, transverse Postpartum Procedures: P.P. tubal ligation at the c/s Social work attempted to assess pt: note does describe any concerns; limited interaction with pt. Complications-Operative and Postpartum: none HEMOGLOBIN  Date Value Ref Range Status  02/12/2016 10.5* 12.0 - 15.0 g/dL Final   HCT  Date Value Ref Range Status  02/12/2016 30.9* 36.0 - 46.0 % Final   HEMATOCRIT  Date Value Ref Range Status  11/19/2015 32.5* 34.0 - 46.6 % Final    Physical Exam:  General: alert, cooperative, appears older than stated age and no distress Lochia: appropriate Uterine Fundus: firm Incision: healing well, lite old blood on left side of incsion dressing. DVT Evaluation: No evidence of DVT seen on physical exam. Negative Homan's sign.  Discharge Diagnoses: Term Pregnancy-delivered and prior cesarean section, for repeat c/section  Discharge Information: Date: 02/13/2016 Activity: pelvic rest Diet: routine Medications: PNV,  Ibuprofen and Percocet Condition: stable Instructions: refer to practice specific booklet Discharge to: home Follow-up Information    Follow up with Tilda BurrowFERGUSON,Kalev Temme V, MD In 4 weeks.   Specialties:  Obstetrics and Gynecology, Radiology   Why:  For wound re-check   Contact information:   [address] [place] [id number] [phone]       Newborn Data: Live born female  Birth Weight: 7 lb 2.8 oz (3255 g) APGAR: 7, 6  Home with mother.  Miguel Medal V 02/13/2016, 7:09 AM

## 2016-02-13 NOTE — Progress Notes (Signed)
CLINICAL SOCIAL WORK MATERNAL/CHILD NOTE  Patient Details  Name: Girl Khaliya Golinski MRN: [id number] Date of Birth: 02/11/2017  Date:  02/13/2016  Clinical Social Worker Initiating Note:  Marisue Ivan, MSW intern  Date/ Time Initiated:  02/13/16/1330     Child's Name:  Pamela [name]    Legal Guardian:  Deanna Artis and Barrett    Need for Interpreter:  None   Date of Referral:  02/11/16     Reason for Referral:  Current Substance Use/Substance Use During Pregnancy , Behavioral Health Issues, including SI    Referral Source:  [facility]   Address:  [address] [place], [place] [address]  Phone number:  [phone]   Household Members:  Self, Minor Children, Significant Other   Natural Supports (not living in the home):  Immediate Family, Parent, Children, Spouse/significant other   Professional Supports: None   Employment: Full-time   Type of Work:     Education:  Associate Professor Resources:  Medicaid   Other Resources:  [facility]   Cultural/Religious Considerations Which May Impact Care:  None Reported   Strengths:  Ability to meet basic needs , Home prepared for child    Risk Factors/Current Problems:  MOB reported that she was prescribed hydrocodone and cough syrup during her pregnancy to help with back pian and other sinus issues she was experiencing. MOB also shared she smoked marijuana during the first trimester to help with her nausea and apatite. MOB denied using any other substances during her pregnancy. MOB had positive drug screens on the following dates:  11/19/15 + Oxycodone (without a prescription) 12/23/15 + THC, hydrocodone, oxycodone (without a prescription), and amphetamines 01/30/16 + hydrocodone   Cognitive State:  Able to Concentrate , Linear Thinking , Insightful , Goal Oriented    Mood/Affect:  Happy , Interested , Comfortable , Relaxed , Calm    CSW Assessment: MSW intern presented in patient's room due to a consult being placed  by the central nursery because of substance use concerns during the pregnancy. MOB's mother was present in the room and MOB provided verbal consent for MSW intern to engage. MOB presented to be happy as evidence by her standing up and being fully dressed along with engaging during the assessment. MOB reported this was her 4th C-section and she knew what to anticipate but shared she was feeling sore. MOB expressed she was both breastfeeding and bottle feeding the infant and was okay with it. MOB voiced that she didn't mind her nipples getting a break and as long as her infant was healthy, she was happy. According to MOB, she has three daughters and three sons, one being adopted. MOB stated that her oldest is 36 years old and she didn't plan to be starting over at 36 years old. However, MOB shared that she was happy to have her infant and just planned to take it day by day. Per MOB, she has a great support system from her children and family and is prepared to go home. 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   MSW intern inquired about MOB's mental health during the pregnancy. MOB shared she was fine and denied having any concerns. MOB denied suffering a perinatal mood disorder after her last 4 children. MSW intern asked MOB if there where any other mental health concerns prior to the pregnancy and she denied them. Per chart review, MOB has a history of depression and is prescribed Celexa.  However, MOB denied having any mental health history or concerns during the assessment with MSW intern.   MSW intern provided education on perinatal mood disorders. MOB was appreciative of the information provided but denied having any further questions or concerns. MOB expressed she was just tired of hospital staff coming in and out every 15-20 minutes and being ready to go home. MOB apologized to MSW intern for seeming upset and expressed it wasn't against her. MSW intern told MOB it  was okay for her to be upset of people coming in and out and that she was not offended. MSW intern provided MOB with a handout that contained information and resources on perinatal mood disorders in order for MOB to reference if needed.   MSW intern asked MOB to further explain why she was upset. MOB shared that she had been informed she had to stay longer at the hospital because her infant had to be monitored for substance use withdrawal. However, MOB expressed that she had only taken prescribed medications during the third trimester of her pregnancy and felt like she was being judged. MOB reported that she had worked hard to recover from her substance abuse past and didn't like how she was being treated. MOB did not clarify what substances she recovered from or how long ago she had been in recovery. Per MOB, this is the first time she has gone through this process were her infant has to be drug screened and monitored. MOB was able to sympathize with the fact that the hospital staff was just looking out for her infants health and hers. MSW intern acknowledged MOB's feelings and gave recognition to her success in recovering and being here today. MSW intern reassured MOB that no one was judging her or viewing her as an "addict" or "alcoholic" as she had expressed earlier. MOB thanked MSW intern for reminding her of her success and shared once again she understood why she had to stay longer. MOB also shared that she smoked marijuana earlier in her pregnancy to help with her nausea along with taking some of her sister's prescribed medications because she thought it was the same as hers. MOB denied taking any unprescribed medications in the past 4 months. MSW intern asked MOB if she consumed any other substances during her pregnancy. MOB expressed she drank a glass of wine about two times during her pregnancy because she was never made aware that it was unhealthy for the infant.  MSW intern informed MOB about the  hospital's drug screening policy and procedures along with letting her know the infant's UDS was + for opiates. MOB shared she had been made aware of the positive drug screen and denied any concerns. MOB assured MSW intern she was not concerned about the cord tissues results and had been honest about all the substances she consumed during the pregnancy.  MSW intern thanked MOB for her honesty.   MSW intern asked MOB how she was feeling about all the information provided. MOB shared she was okay with everything and was not as upset anymore since she got more clarity. MOB voiced being ready to go home and be able to relax but understood why she had to stay a few more days .  The infant started crying and MOB started to get distracted because it was time to feed the infant. MOB denied having any further questions or concerns but agreed to contact MSW intern if needs arise.  MOB and MOB's mother thanked MSW intern for stopping  by.   CSW Plan/Description:   Patient/Family Education- MSW intern provided education on perinatal mood disorders.  MSW intern to monitor cord tissue drug screen and file a Child Protective Service Report as needed. (UDS + for opiates)  No Further Intervention Required/No Barriers to Discharge    Kandis CockingYazmin Hamdan Toscano, Student-SW 02/13/2016, 2:02 PM

## 2016-02-13 NOTE — Progress Notes (Signed)
Post Partum Day 2 Subjective: no complaints, up ad lib, voiding, tolerating PO, + flatus and had BM yesterday  Objective: Blood pressure 108/51, pulse 69, temperature 98.7 F (37.1 C), temperature source Oral, resp. rate 16, last menstrual period 05/05/2015, SpO2 98 %, unknown if currently breastfeeding.  Physical Exam:  General: alert, cooperative and no distress Lochia: appropriate Uterine Fundus: firm Incision: no significant erythema, honeycomb bandage over incision. Some blood noted on honeycomb bandage DVT Evaluation: No evidence of DVT seen on physical exam. Negative Homan's sign. No cords or calf tenderness. No significant calf/ankle edema.   Recent Labs  02/10/16 1155 02/12/16 0537  HGB 11.6* 10.5*  HCT 34.4* 30.9*    Assessment/Plan: Discharge home after patient sees social work.    LOS: 2 days   Pamela [name] 02/13/2016, 7:28 AM   I have seen and examined this patient and I agree with the above. See Discharge Summary. Cam HaiSHAW, KIMBERLY 9:17 AM 02/13/2016

## 2016-02-13 NOTE — Lactation Note (Addendum)
This note was copied from a baby's chart. Lactation Consultation Note  9% weight loss . Mother has abrasions on tips of both nipples.  Mother is wearing comfort gels. Encouraged her to apply ebm. Baby recently breastfed for 25 min. Suggest she call to check latch for depth with next feeding.  Mother called for assistance w/ latching. Noted anterior lingual frenulum and heart shaped tongue when baby was crying. Mother states she was aware and had asked RN about it. Recommend she discuss it with her Pediatrician. Assisted with latching close to body in football hold with a deep latch and flanged lips. Encouraged mother to compress/massage breast during feeding. Sucks and swallows observed for more than 10 min. Suggest she breastfeed on both breasts, apply ebm and then comfort gels. Discussed w/ mother if baby slips down on breast, unlatch her and relatch.    Patient Name: Pamela [name] WNUUV'OToday's Date: 02/13/2016 Reason for consult: Follow-up assessment   Maternal Data    Feeding Feeding Type: Breast Fed Length of feed: 25 min  LATCH Score/Interventions                      Lactation Tools Discussed/Used     Consult Status Consult Status: PRN    Hardie PulleyBerkelhammer, Albertha Beattie Boschen 02/13/2016, 1:11 PM

## 2016-02-13 NOTE — Progress Notes (Signed)
Subjective: Postpartum Day 2: Cesarean Delivery Patient reports incisional pain, tolerating PO, + flatus, + BM and no problems voiding.  Noting cramps with breast feeding. Left side of incision is more sore.   Objective: Vital signs in last 24 hours: Temp:  [98.1 F (36.7 C)-98.7 F (37.1 C)] 98.7 F (37.1 C) (03/09 0521) Pulse Rate:  [67-73] 69 (03/09 0521) Resp:  [16-18] 16 (03/09 0521) BP: (106-110)/(51-66) 108/51 mmHg (03/09 0521) SpO2:  [98 %] 98 % (03/08 0930)  Physical Exam:  General: alert and cooperative Lochia: appropriate Uterine Fundus: firm Incision: healing well, no significant drainage, no dehiscence, dressing from c/s still in place, slight dry ooze on left side, none recent, no firmness at incision DVT Evaluation: No evidence of DVT seen on physical exam. Negative Homan's sign.   Recent Labs  02/10/16 1155 02/12/16 0537  HGB 11.6* 10.5*  HCT 34.4* 30.9*    Assessment/Plan: Status post Cesarean section. Doing well postoperatively.  Discharge home with standard precautions and return to clinic in 4-6 weeks.  Pamela [name] V 02/13/2016, 7:03 AM

## 2016-02-13 NOTE — Discharge Instructions (Signed)
Cesarean Delivery, Care After  Refer to this sheet in the next few weeks. These instructions provide you with information on caring for yourself after your procedure. Your health care provider may also give you specific instructions. Your treatment has been planned according to current medical practices, but problems sometimes occur. Call your health care provider if you have any problems or questions after you go home.  HOME CARE INSTRUCTIONS   Only take over-the-counter or prescription medications as directed by your health care provider.   Do not drink alcohol, especially if you are breastfeeding or taking medication to relieve pain.   Do not chew or smoke tobacco.   Continue to use good perineal care. Good perineal care includes:    Wiping your perineum from front to back.    Keeping your perineum clean.   Check your surgical cut (incision) daily for increased redness, drainage, swelling, or separation of skin.   Clean your incision gently with soap and water every day, and then pat it dry. If your health care provider says it is okay, leave the incision uncovered. Use a bandage (dressing) if the incision is draining fluid or appears irritated. If the adhesive strips across the incision do not fall off within 7 days, carefully peel them off.   Hug a pillow when coughing or sneezing until your incision is healed. This helps to relieve pain.   Do not use tampons or douche until your health care provider says it is okay.   Shower, wash your hair, and take tub baths as directed by your health care provider.   Wear a well-fitting bra that provides breast support.   Limit wearing support panties or control-top hose.   Drink enough fluids to keep your urine clear or pale yellow.   Eat high-fiber foods such as whole grain cereals and breads, brown rice, beans, and fresh fruits and vegetables every day. These foods may help prevent or relieve constipation.   Resume activities such as climbing stairs,  driving, lifting, exercising, or traveling as directed by your health care provider.   Talk to your health care provider about resuming sexual activities. This is dependent upon your risk of infection, your rate of healing, and your comfort and desire to resume sexual activity.   Try to have someone help you with your household activities and your newborn for at least a few days after you leave the hospital.   Rest as much as possible. Try to rest or take a nap when your newborn is sleeping.   Increase your activities gradually.   Keep all of your scheduled postpartum appointments. It is very important to keep your scheduled follow-up appointments. At these appointments, your health care provider will be checking to make sure that you are healing physically and emotionally.  SEEK MEDICAL CARE IF:    You are passing large clots from your vagina. Save any clots to show your health care provider.   You have a foul smelling discharge from your vagina.   You have trouble urinating.   You are urinating frequently.   You have pain when you urinate.   You have a change in your bowel movements.   You have increasing redness, pain, or swelling near your incision.   You have pus draining from your incision.   Your incision is separating.   You have painful, hard, or reddened breasts.   You have a severe headache.   You have blurred vision or see spots.   You feel sad   or depressed.   You have thoughts of hurting yourself or your newborn.   You have questions about your care, the care of your newborn, or medications.   You are dizzy or light-headed.   You have a rash.   You have pain, redness, or swelling at the site of the removed intravenous access (IV) tube.   You have nausea or vomiting.   You stopped breastfeeding and have not had a menstrual period within 12 weeks of stopping.   You are not breastfeeding and have not had a menstrual period within 12 weeks of delivery.   You have a fever.  SEEK  IMMEDIATE MEDICAL CARE IF:   You have persistent pain.   You have chest pain.   You have shortness of breath.   You faint.   You have leg pain.   You have stomach pain.   Your vaginal bleeding saturates 2 or more sanitary pads in 1 hour.  MAKE SURE YOU:    Understand these instructions.   Will watch your condition.   Will get help right away if you are not doing well or get worse.     This information is not intended to replace advice given to you by your health care provider. Make sure you discuss any questions you have with your health care provider.     Document Released: 08/15/2002 Document Revised: 12/14/2014 Document Reviewed: 07/20/2012  Elsevier Interactive Patient Education 2016 [employer]

## 2016-02-13 NOTE — Lactation Note (Signed)
This note was copied from a baby's chart. Lactation Consultation Note Mom asking for formula. RN explained risk of BF and giving formula. Baby has had 9% WEIGHT LOSS IN 32 HRS. Had 9 stools and 4 voids. Mom has small breast that colostrum is easily expressed. Breast are filling slightly, encouraged to pump to relieve. Baby is cluster feeding, explained that reasoning and how to manage to prevent from getting engorged. Mom stated nipples were sore and her stomach was cramping terrible from all of the nursing and needed a break, and mom doesn't feel the baby isn't getting enough. Discussed the output the baby has had is d/t all of the colostrum the baby is getting from all of the feedings. Mom stated I need a break so she will rest and not be hungry and the pain in my stomach can ease. Gave mom a bottle of formula as she has requested. Also discussed engorgement and prevention which is BF or pump, which will of course stimulate cramps.  Patient Name: Pamela Waldon MerlKeisha Stradling WUJWJ'XToday's Date: 02/13/2016 Reason for consult: Follow-up assessment;Infant weight loss   Maternal Data    Feeding Feeding Type: Breast Fed Length of feed: 20 min  LATCH Score/Interventions Latch: Grasps breast easily, tongue down, lips flanged, rhythmical sucking.  Audible Swallowing: Spontaneous and intermittent Intervention(s): Hand expression  Type of Nipple: Everted at rest and after stimulation Intervention(s): Hand pump  Comfort (Breast/Nipple): Filling, red/small blisters or bruises, mild/mod discomfort  Problem noted: Mild/Moderate discomfort;Filling Interventions (Filling): Massage;Hand pump Interventions (Mild/moderate discomfort): Comfort gels;Post-pump;Hand massage;Hand expression  Hold (Positioning): No assistance needed to correctly position infant at breast.  LATCH Score: 9  Lactation Tools Discussed/Used Tools: Pump;Comfort gels Breast pump type: Manual Pump Review: Setup, frequency, and cleaning;Milk  Storage Initiated by:: RN Date initiated:: 02/12/16   Consult Status Consult Status: PRN Date: 02/13/16 Follow-up type: In-patient    Pamela [name], Pamela [name] 02/13/2016, 2:46 AM

## 2016-02-14 ENCOUNTER — Ambulatory Visit: Payer: Self-pay

## 2016-02-14 NOTE — Lactation Note (Addendum)
This note was copied from a baby's chart. Lactation Consultation Note  Patient Name: Pamela [name] Pamela [name] ZOXWR'UToday's Date: 02/14/2016  Follow up assessment of 8367 hour old infant. Infant with 7 BF for 15-25 minutes, bottle fed formula x 5 of 13-60 cc, 6 voids, and 5 stools in last 24 hours. Infant with yellow seedy stools this morning. Infant weight 6 lb 9.3 oz, with an 8% weight loss since birth, an increase of 0.9 oz in last 24 hours.   Mom with small breasts with everted nipples, Milk is in and easily expressed. Left nipple is excoriated and mom is currently hand pumping left side and feeding EBM to infant via bottle . Infant is feeding on the right breast and no excoriation is noted. Mom does not feel she can latch infant to left breast at this time. She plans to feed infant on the right side. Mom pumped 30 cc this morning from left breast. Mom is using EBM, comfort gels and was given breast shells with instructions for use to wear between feeds during the day.   Infant with anterior frenulum and heart shaped tongue with crying, tongue does not cup well although she did well feeding on the right breast and milk was transferred. Mom plans to ask Ped when they come to examine infant.   Infant was cueing to feed, mom placed infant to right breast in football hold. She latched easily with frequent audible swallows. Breast did soften with feeding. Mom did experience pain with initial latch that did improve with feeding. Infant fed for 15 minutes and then self detached.  Enc mom to breast feed infant 8-12 x in 24 hours at first feeding cues and use her milk to supplement infant. Advised mom that to maintain supply to left breast, she needs to pump every 2-3 hours. Mom reports she has a DEBP at home for use.   Infant with a Ped follow up appt on Monday. Mom reports she expects to go home today but has not seen Ped this morning.   Reviewed all BF information in Taking Care of Baby and Me Booklet. Reviewed  Engorgement prevention/treatment. Reviewed I/O and need to maintain feeding log and take to Ped visit. Reviewed LC Brochure, mom aware of OP Services, BF Support Groups and LC phone #. Enc mom to call with questions/concerns prn.    Maternal Data Formula Feeding for Exclusion: No Has patient been taught Hand Expression?: Yes Does the patient have breastfeeding experience prior to this delivery?: Yes  Feeding Feeding Type: Breast Fed Nipple Type: Slow - flow Length of feed: 15 min  LATCH Score/Interventions Latch: Grasps breast easily, tongue down, lips flanged, rhythmical sucking. Intervention(s): Adjust position;Assist with latch;Breast massage;Breast compression  Audible Swallowing: Spontaneous and intermittent Intervention(s): Skin to skin;Hand expression  Type of Nipple: Everted at rest and after stimulation  Comfort (Breast/Nipple): Filling, red/small blisters or bruises, mild/mod discomfort  Problem noted: Cracked, bleeding, blisters, bruises;Mild/Moderate discomfort Interventions (Filling): Massage;Firm support;Frequent nursing;Hand pump Interventions  (Cracked/bleeding/bruising/blister): Expressed breast milk to nipple;Hand pump Interventions (Mild/moderate discomfort): Hand massage;Comfort gels  Hold (Positioning): No assistance needed to correctly position infant at breast. Intervention(s): Breastfeeding basics reviewed;Support Pillows;Position options;Skin to skin  LATCH Score: 9  Lactation Tools Discussed/Used Tools: Shells;Comfort gels Shell Type: Inverted Breast pump type: Manual Pump Review: Setup, frequency, and cleaning;Milk Storage   Consult Status Consult Status: Complete Follow-up type: Call as needed    Ed BlalockSharon S Jenica Costilow 02/14/2016, 10:53 AM

## 2016-02-17 ENCOUNTER — Encounter (HOSPITAL_COMMUNITY)
Admission: RE | Disposition: A | Discharge status: Home / Self Care | Payer: Self-pay | Source: Ambulatory Visit | Attending: Family Medicine

## 2016-02-17 SURGERY — Surgical Case
Anesthesia: Regional | Laterality: Bilateral

## 2016-02-18 ENCOUNTER — Telehealth: Payer: Self-pay | Admitting: Obstetrics and Gynecology

## 2016-02-18 ENCOUNTER — Other Ambulatory Visit: Payer: Self-pay | Admitting: Obstetrics and Gynecology

## 2016-02-18 MED ORDER — HYDROCODONE-ACETAMINOPHEN 5-325 MG PO TABS: 1.0000 | ORAL_TABLET | Freq: Four times a day (QID) | ORAL | Status: DC | PRN

## 2016-02-18 NOTE — Telephone Encounter (Signed)
Pt given 30 tablets on 02/13/16, pt was given Hycodan on 02/10/16. Pt had positive drug screen on 02/11/16, opiates. UDS was not sent at her last visit here due to pt stating that she had to take hydrocodone and hycodan during the week and the night before.    Pt states that she is taking the percocet every 6 hours. Pt states that she started out taking two tablets every 6 hours Thursday night and Friday, then starting taking 1.5 tablets since then. Pt rates her pain as "fine" because she took a dose this morning. Pt states that she has 1.5 tablets left.  Pt aware that Dr. Emelda FearFerguson is not in the office until after lunch.

## 2016-02-18 NOTE — Telephone Encounter (Signed)
Pt called stating that she needs refill of her medication percocet, please contact pt

## 2016-02-18 NOTE — Telephone Encounter (Signed)
rx vicodin x 1 wk as stepdown from percocet p c/s. NO more to be given, and pt aware and agrees to this.

## 2016-02-20 ENCOUNTER — Encounter: Payer: Self-pay | Admitting: Obstetrics & Gynecology

## 2016-02-20 ENCOUNTER — Ambulatory Visit (INDEPENDENT_AMBULATORY_CARE_PROVIDER_SITE_OTHER): Payer: Medicaid Other | Admitting: Obstetrics & Gynecology

## 2016-02-20 VITALS — BP 118/74 | HR 90 | Wt 131.5 lb

## 2016-02-20 DIAGNOSIS — Z98891 History of uterine scar from previous surgery: Secondary | ICD-10-CM

## 2016-03-10 ENCOUNTER — Ambulatory Visit (INDEPENDENT_AMBULATORY_CARE_PROVIDER_SITE_OTHER): Payer: Medicaid Other | Admitting: Family Medicine

## 2016-03-10 ENCOUNTER — Encounter: Payer: Self-pay | Admitting: Family Medicine

## 2016-03-10 VITALS — BP 112/80 | Temp 98.4°F | Ht 64.0 in | Wt 114.4 lb

## 2016-03-10 DIAGNOSIS — G43009 Migraine without aura, not intractable, without status migrainosus: Secondary | ICD-10-CM | POA: Diagnosis not present

## 2016-03-10 DIAGNOSIS — R946 Abnormal results of thyroid function studies: Secondary | ICD-10-CM | POA: Diagnosis not present

## 2016-03-10 DIAGNOSIS — R634 Abnormal weight loss: Secondary | ICD-10-CM

## 2016-03-10 DIAGNOSIS — R7989 Other specified abnormal findings of blood chemistry: Secondary | ICD-10-CM

## 2016-03-10 MED ORDER — SUMATRIPTAN SUCCINATE 50 MG PO TABS: 50.0000 mg | ORAL_TABLET | ORAL | Status: DC | PRN

## 2016-03-10 MED ORDER — HYDROCODONE-ACETAMINOPHEN 5-325 MG PO TABS: 1.0000 | ORAL_TABLET | Freq: Four times a day (QID) | ORAL | Status: DC | PRN

## 2016-03-10 NOTE — Progress Notes (Signed)
   Subjective:    Patient ID: Pamela [name], female    DOB: [birth year], 35 y.o.   MRN: [id number]  Migraine  This is a new problem. The current episode started in the past 7 days. The problem has been unchanged. The quality of the pain is described as aching. The pain is moderate. Nothing aggravates the symptoms. She has tried NSAIDs and oral narcotics for the symptoms. The treatment provided mild relief.   Patient is also having a sore throat, runny nose, ear pain and headache. Onset 1 day.  Denies any high fever chills sweats severe vomiting nausea or diarrhea Review of Systems     Objective:   Physical Exam Migraine seems to be stable doesn't appear to be toxic neck no masses lungs clear heart regular pulse normal  Probable viral upper rest real illness no sign of sinusitis on today's exam     Assessment & Plan:  Migraine-occurs only around the time of her cycle Imitrex would be fine she is not to breast-feed within 12 hours of taking Imitrex. If more frequent headaches may need to be on preventative medicine  Viral URI antibiotics not indicated follow-up if problems  Significant weight loss over the past couple months since being pregnant paced states she is trying to eat healthy she is breast-feeding we will go ahead and check thyroid function make sure there is not any type of hyperthyroidism going on-follow-up 6 weeks

## 2016-03-11 ENCOUNTER — Encounter: Payer: Self-pay | Admitting: Family Medicine

## 2016-03-11 LAB — T3 UPTAKE: T3 UPTAKE RATIO: 23 % — AB (ref 24–39)

## 2016-03-11 LAB — T4, FREE: FREE T4: 1.17 ng/dL (ref 0.82–1.77)

## 2016-03-11 LAB — TSH: TSH: 0.088 u[IU]/mL — ABNORMAL LOW (ref 0.450–4.500)

## 2016-03-11 NOTE — Addendum Note (Signed)
Addended by: Jeralene PetersREWS, Chandelle Harkey R on: 03/11/2016 09:31 AM   Modules accepted: Orders

## 2016-04-01 ENCOUNTER — Encounter: Payer: Self-pay | Admitting: "Endocrinology

## 2016-04-01 ENCOUNTER — Ambulatory Visit (INDEPENDENT_AMBULATORY_CARE_PROVIDER_SITE_OTHER): Payer: Medicaid Other | Admitting: "Endocrinology

## 2016-04-01 VITALS — BP 99/63 | HR 83 | Ht 64.0 in | Wt 114.0 lb

## 2016-04-01 DIAGNOSIS — E0590 Thyrotoxicosis, unspecified without thyrotoxic crisis or storm: Secondary | ICD-10-CM | POA: Insufficient documentation

## 2016-04-01 NOTE — Progress Notes (Signed)
Subjective:    Patient ID: Pamela [name], female    DOB: Apr 26, 1981, PCP Pamela Punt, MD   Past Medical History  Diagnosis Date  . Abnormal vaginal Pap smear   . Vaginal Pap smear, abnormal   . Migraines   . Pregnant 06/19/2015  . Depression    Past Surgical History  Procedure Laterality Date  . Cesarean section  x 2  . Back surgery    . Tonsillectomy and adenoidectomy    . Cesarean section with bilateral tubal ligation Bilateral 02/11/2016    Procedure: REPEAT CESAREAN SECTION WITH BILATERAL TUBAL LIGATION;  Surgeon: Pamela Burrow, MD;  Location: [facility];  Service: Obstetrics;  Laterality: Bilateral;   Social History   Social History  . Marital Status: Divorced    Spouse Name: N/A  . Number of Children: N/A  . Years of Education: 2 yr coll.   Occupational History  . event planner    Social History Main Topics  . Smoking status: Current Every Day Smoker -- 1.00 packs/day for 19 years    Types: Cigarettes    Start date: 08/02/2007  . Smokeless tobacco: Never Used     Comment: smokes 6 cig per day  . Alcohol Use: No     Comment: once a month; not now  . Drug Use: No  . Sexual Activity: Yes    Birth Control/ Protection: None   Other Topics Concern  . None   Social History Narrative   Outpatient Encounter Prescriptions as of 04/01/2016  Medication Sig  . acetaminophen (TYLENOL) 325 MG tablet Take 650 mg by mouth every 6 (six) hours as needed (Pain). Reported on 03/10/2016  . Prenatal Vit-Fe Fumarate-FA (PNV PRENATAL PLUS MULTIVITAMIN) 27-1 MG TABS Take 1 tablet by mouth daily.  . Calcium-Phosphorus-Vitamin D (DICAL-D PO) Take by mouth. Reported on 04/01/2016  . citalopram (CELEXA) 10 MG tablet Take 1 tablet (10 mg total) by mouth daily. (Patient not taking: Reported on 03/10/2016)  . HYDROcodone-acetaminophen (NORCO/VICODIN) 5-325 MG tablet Take 1 tablet by mouth every 6 (six) hours as needed. Last Rx after surgery (Patient not taking: Reported on 04/01/2016)  .  ibuprofen (ADVIL,MOTRIN) 600 MG tablet Take 1 tablet (600 mg total) by mouth every 6 (six) hours. (Patient not taking: Reported on 04/01/2016)  . SUMAtriptan (IMITREX) 50 MG tablet Take 1 tablet (50 mg total) by mouth every 2 (two) hours as needed for migraine. May repeat in 2 hours if headache persists or recurs. (Patient not taking: Reported on 04/01/2016)   No facility-administered encounter medications on file as of 04/01/2016.   ALLERGIES: Allergies  Allergen Reactions  . Zomig [Zolmitriptan] Shortness Of Breath and Swelling    Imitrex is tolerated well   VACCINATION STATUS: Immunization History  Administered Date([name]) Administered  . Influenza,inj,Quad PF,36+ Mos 09/24/2015  . Pneumococcal Polysaccharide-23 02/13/2016  . Tdap 02/12/2016    HPI 35 year old female patient with medical history as above. She is being seen in consultation for subclinical hyperthyroidism requested by Dr. Gerda [name]. -She is 7 weeks postpartum with her fifth child. She was being evaluated for failure to gain weight after delivery despite good appetite when she was found to have clinically suppressed TSH of 0.088 associated with normal free T4 of 1.17. -She did have some heat intolerance however denies palpitations, tremors, sweating. -She has always been light build. She is currently breast-feeding. She denies family history of thyroid dysfunction. She denies any personal history of goiter. No family history of thyroid cancer. She  smokes heavily a pack a day currently. She is only on prenatal vitamins.  Review of Systems Constitutional: Unable to gain weight ,  +fatigue, + subjective hyperthermia Eyes: no blurry vision, no xerophthalmia ENT: no sore throat, no nodules palpated in throat, no dysphagia/odynophagia, no hoarseness Cardiovascular: no CP/SOB/palpitations/leg swelling Respiratory: no cough/SOB Gastrointestinal: no N/V/D/C Musculoskeletal: no muscle/joint aches Skin: no rashes Neurological: no  tremors/numbness/tingling/dizziness Psychiatric: no depression/anxiety  Objective:    BP 99/63 mmHg  Pulse 83  Ht 5\' 4"  (1.626 m)  Wt 114 lb (51.71 kg)  BMI 19.56 kg/m2  SpO2 96%  Wt Readings from Last 3 Encounters:  04/01/16 114 lb (51.71 kg)  03/10/16 114 lb 6 oz (51.88 kg)  02/20/16 131 lb 8 oz (59.648 kg)    Physical Exam Constitutional: Light build with BMI of 19.5,  in NAD Eyes: PERRLA, EOMI, no exophthalmos ENT: moist mucous membranes, no thyromegaly, no cervical lymphadenopathy Cardiovascular: RRR, No MRG Respiratory: CTA B Gastrointestinal: abdomen soft, NT, ND, BS+ Musculoskeletal: no deformities, strength intact in all 4 Skin: moist, warm, no rashes Neurological: no tremor with outstretched hands, DTR normal in all 4   Results for Pamela [name], Pamela [name] (MRN [id number]) as of 04/01/2016 13:50  Ref. Range 03/10/2016 12:14  TSH Latest Ref Range: 0.450-4.500 uIU/mL 0.088 (L)  T4,Free(Direct) Latest Ref Range: 0.82-1.77 [name]/dL 4.091.17  T3 Uptake Ratio Latest Ref Range: 24-39 % 23 (L)    Assessment & Plan:   1. Subclinical hyperthyroidism -I have reviewed her available thyroid workup and clinically evaluated patient. She has some subtle signs and symptoms of her toxicosis. Her thyroid function tests are more consistent with subclinical hyperthyroidism. This could be a picture of postpartum thyroiditis. The majority of times this resolves with no subsequent sequelae. -However sometimes thyroid dysfunction continues with either hyperthyroidism or hypothyroidism. It is appropriate for her to repeat thyroid function test including antithyroid antibodies in about 5 weeks with office visit. -I will proceed with no antithyroid intervention for now. -I will hold off obtaining thyroid uptake and scan due to the fact that she is breast-feeding. -Her body weight has been consistent over the last 4 weeks at least with no more weight loss. -I have advised her to increase her caloric intake to  maintain her own weight as well as to continue to support her young baby with enough breast milk.  - I advised patient to maintain close follow up with Pamela PuntScott Luking, MD for primary care needs. Follow up plan: Return in about 6 weeks (around 05/13/2016) for overactive thyroid, follow up with pre-visit labs.  Marquis LunchGebre Nida, MD Phone: [phone]  Fax: [phone]   04/01/2016, 2:24 PM

## 2016-04-02 ENCOUNTER — Ambulatory Visit: Payer: Medicaid Other | Admitting: Advanced Practice Midwife

## 2016-04-03 ENCOUNTER — Ambulatory Visit: Payer: Medicaid Other | Admitting: Obstetrics & Gynecology

## 2016-04-03 ENCOUNTER — Encounter: Payer: Self-pay | Admitting: *Deleted

## 2016-04-19 NOTE — Progress Notes (Signed)
Patient ID: Pamela [name], female   DOB: 1981/10/05, 35 y.o.   MRN: [id number]  HPI: Patient returns for routine postoperative follow-up having undergone repeat cesarean section with bilateral tubal ligation  on 02/11/2016.  The patient's immediate postoperative recovery has been unremarkable. Since hospital discharge the patient reports no specific problems.   Current Outpatient Prescriptions: acetaminophen (TYLENOL) 325 MG tablet, Take 650 mg by mouth every 6 (six) hours as needed (Pain). Reported on 03/10/2016, Disp: , Rfl:  Calcium-Phosphorus-Vitamin D (DICAL-D PO), Take by mouth. Reported on 04/01/2016, Disp: , Rfl:  citalopram (CELEXA) 10 MG tablet, Take 1 tablet (10 mg total) by mouth daily. (Patient not taking: Reported on 03/10/2016), Disp: 30 tablet, Rfl: 3 ibuprofen (ADVIL,MOTRIN) 600 MG tablet, Take 1 tablet (600 mg total) by mouth every 6 (six) hours. (Patient not taking: Reported on 04/01/2016), Disp: 30 tablet, Rfl: 0 Prenatal Vit-Fe Fumarate-FA (PNV PRENATAL PLUS MULTIVITAMIN) 27-1 MG TABS, Take 1 tablet by mouth daily., Disp: 30 tablet, Rfl: 11 HYDROcodone-acetaminophen (NORCO/VICODIN) 5-325 MG tablet, Take 1 tablet by mouth every 6 (six) hours as needed. Last Rx after surgery (Patient not taking: Reported on 04/01/2016), Disp: 20 tablet, Rfl: 0 SUMAtriptan (IMITREX) 50 MG tablet, Take 1 tablet (50 mg total) by mouth every 2 (two) hours as needed for migraine. May repeat in 2 hours if headache persists or recurs. (Patient not taking: Reported on 04/01/2016), Disp: 10 tablet, Rfl: 3  No current facility-administered medications for this visit.    Blood pressure 118/74, pulse 90, weight 131 lb 8 oz (59.648 kg), last menstrual period 05/05/2015, unknown if currently breastfeeding.  Physical Exam: Incision is clean dry and intact with abdominal exam being benign normal postop  Diagnostic Tests:   Pathology:   Impression: Status post a repeat cesarean section and bilateral tubal  ligation with normal postoperative course Some yeast changes  Plan: 5 weeks for final postop visit   Follow up:     Lazaro ArmsEURE,Tanmay Halteman H, MD  No orders of the defined types were placed in this encounter.

## 2016-04-22 ENCOUNTER — Encounter: Payer: Self-pay | Admitting: Family Medicine

## 2016-04-22 ENCOUNTER — Ambulatory Visit (INDEPENDENT_AMBULATORY_CARE_PROVIDER_SITE_OTHER): Payer: Medicaid Other | Admitting: Family Medicine

## 2016-04-22 VITALS — BP 92/82 | Ht 64.0 in | Wt 115.8 lb

## 2016-04-22 DIAGNOSIS — R634 Abnormal weight loss: Secondary | ICD-10-CM | POA: Diagnosis not present

## 2016-04-22 DIAGNOSIS — R51 Headache: Secondary | ICD-10-CM

## 2016-04-22 DIAGNOSIS — R519 Headache, unspecified: Secondary | ICD-10-CM

## 2016-04-22 NOTE — Progress Notes (Signed)
Referral ordered in epic 

## 2016-04-22 NOTE — Progress Notes (Signed)
   Subjective:    Patient ID: Pamela [name], female    DOB: Jul 13, 1981, 35 y.o.   MRN: [id number]  HPI  Patient arrives for follow up on weight loss and migraines. She has seen endocrinology was diagnosed with a thyroid condition she denies any chest tightness pressure pain shortness breath she does relate frequent headaches she denies nausea vomiting diarrhea with headaches PMH benign Review of Systems See above    Objective:   Physical Exam  Blood pressure checked sitting and standing was stable Lungs are clear hearts regular HEENT benign      Assessment & Plan:  Frequent headaches patient is breast-feeding I'm hesitant to put her on anything currently she uses Tylenol I would recommend the patient try to avoid overusing Tylenol we will get neurology consultation to see if there is anything different they would recommend.  As for her weight her weight is stabilizing her thyroid condition is felt to be due to pregnancy hopefully this will get better with time the endocrinologist's rechecking in several months

## 2016-04-22 NOTE — Addendum Note (Signed)
Addended by: Theodora BlowREWS, Mickle Campton R on: 04/22/2016 01:14 PM   Modules accepted: Orders

## 2016-05-12 ENCOUNTER — Ambulatory Visit: Payer: Medicaid Other | Admitting: Neurology

## 2016-05-13 ENCOUNTER — Encounter: Payer: Self-pay | Admitting: Neurology

## 2016-05-14 ENCOUNTER — Ambulatory Visit: Payer: Medicaid Other | Admitting: "Endocrinology

## 2016-05-28 ENCOUNTER — Ambulatory Visit: Payer: Medicaid Other | Admitting: "Endocrinology

## 2018-07-16 DIAGNOSIS — R1031 Right lower quadrant pain: Secondary | ICD-10-CM | POA: Diagnosis not present

## 2018-11-23 DIAGNOSIS — K0889 Other specified disorders of teeth and supporting structures: Secondary | ICD-10-CM | POA: Diagnosis not present

## 2020-06-27 ENCOUNTER — Other Ambulatory Visit: Payer: Self-pay

## 2020-06-27 ENCOUNTER — Ambulatory Visit (INDEPENDENT_AMBULATORY_CARE_PROVIDER_SITE_OTHER): Payer: Medicaid Other | Admitting: Nurse Practitioner

## 2020-06-27 ENCOUNTER — Encounter: Payer: Self-pay | Admitting: Nurse Practitioner

## 2020-06-27 VITALS — BP 110/72 | Temp 97.2°F | Wt 123.4 lb

## 2020-06-27 DIAGNOSIS — M5442 Lumbago with sciatica, left side: Secondary | ICD-10-CM

## 2020-06-27 MED ORDER — NAPROXEN 500 MG PO TABS: 500.0000 mg | ORAL_TABLET | Freq: Two times a day (BID) | ORAL | 0 refills | Status: DC

## 2020-06-27 MED ORDER — AMITRIPTYLINE HCL 10 MG PO TABS: 10.0000 mg | ORAL_TABLET | Freq: Every day | ORAL | 2 refills | Status: DC

## 2020-06-27 NOTE — Patient Instructions (Signed)
Ice packs or heat to lower back.  Salonpas / lidocaine patch to lower back.  May try Biofreeze.  Back exercises.  Don't sleep in chair, try air mattress.  Elavil 10 mg at bedtime.  May take Acetaminophen.   Low Back Sprain or Strain Rehab Ask your health care provider which exercises are safe for you. Do exercises exactly as told by your health care provider and adjust them as directed. It is normal to feel mild stretching, pulling, tightness, or discomfort as you do these exercises. Stop right away if you feel sudden pain or your pain gets worse. Do not begin these exercises until told by your health care provider. Stretching and range-of-motion exercises These exercises warm up your muscles and joints and improve the movement and flexibility of your back. These exercises also help to relieve pain, numbness, and tingling. Lumbar rotation  1. Lie on your back on a firm surface and bend your knees. 2. Straighten your arms out to your sides so each arm forms a 90-degree angle (right angle) with a side of your body. 3. Slowly move (rotate) both of your knees to one side of your body until you feel a stretch in your lower back (lumbar). Try not to let your shoulders lift off the floor. 4. Hold this position for __________ seconds. 5. Tense your abdominal muscles and slowly move your knees back to the starting position. 6. Repeat this exercise on the other side of your body. Repeat __________ times. Complete this exercise __________ times a day. Single knee to chest  1. Lie on your back on a firm surface with both legs straight. 2. Bend one of your knees. Use your hands to move your knee up toward your chest until you feel a gentle stretch in your lower back and buttock. ? Hold your leg in this position by holding on to the front of your knee. ? Keep your other leg as straight as possible. 3. Hold this position for __________ seconds. 4. Slowly return to the starting position. 5. Repeat with  your other leg. Repeat __________ times. Complete this exercise __________ times a day. Prone extension on elbows  1. Lie on your abdomen on a firm surface (prone position). 2. Prop yourself up on your elbows. 3. Use your arms to help lift your chest up until you feel a gentle stretch in your abdomen and your lower back. ? This will place some of your body weight on your elbows. If this is uncomfortable, try stacking pillows under your chest. ? Your hips should stay down, against the surface that you are lying on. Keep your hip and back muscles relaxed. 4. Hold this position for __________ seconds. 5. Slowly relax your upper body and return to the starting position. Repeat __________ times. Complete this exercise __________ times a day. Strengthening exercises These exercises build strength and endurance in your back. Endurance is the ability to use your muscles for a long time, even after they get tired. Pelvic tilt This exercise strengthens the muscles that lie deep in the abdomen. 1. Lie on your back on a firm surface. Bend your knees and keep your feet flat on the floor. 2. Tense your abdominal muscles. Tip your pelvis up toward the ceiling and flatten your lower back into the floor. ? To help with this exercise, you may place a small towel under your lower back and try to push your back into the towel. 3. Hold this position for __________ seconds. 4. Let your muscles  relax completely before you repeat this exercise. Repeat __________ times. Complete this exercise __________ times a day. Alternating arm and leg raises  1. Get on your hands and knees on a firm surface. If you are on a hard floor, you may want to use padding, such as an exercise mat, to cushion your knees. 2. Line up your arms and legs. Your hands should be directly below your shoulders, and your knees should be directly below your hips. 3. Lift your left leg behind you. At the same time, raise your right arm and  straighten it in front of you. ? Do not lift your leg higher than your hip. ? Do not lift your arm higher than your shoulder. ? Keep your abdominal and back muscles tight. ? Keep your hips facing the ground. ? Do not arch your back. ? Keep your balance carefully, and do not hold your breath. 4. Hold this position for __________ seconds. 5. Slowly return to the starting position. 6. Repeat with your right leg and your left arm. Repeat __________ times. Complete this exercise __________ times a day. Abdominal set with straight leg raise  1. Lie on your back on a firm surface. 2. Bend one of your knees and keep your other leg straight. 3. Tense your abdominal muscles and lift your straight leg up, 4-6 inches (10-15 cm) off the ground. 4. Keep your abdominal muscles tight and hold this position for __________ seconds. ? Do not hold your breath. ? Do not arch your back. Keep it flat against the ground. 5. Keep your abdominal muscles tense as you slowly lower your leg back to the starting position. 6. Repeat with your other leg. Repeat __________ times. Complete this exercise __________ times a day. Single leg lower with bent knees 1. Lie on your back on a firm surface. 2. Tense your abdominal muscles and lift your feet off the floor, one foot at a time, so your knees and hips are bent in 90-degree angles (right angles). ? Your knees should be over your hips and your lower legs should be parallel to the floor. 3. Keeping your abdominal muscles tense and your knee bent, slowly lower one of your legs so your toe touches the ground. 4. Lift your leg back up to return to the starting position. ? Do not hold your breath. ? Do not let your back arch. Keep your back flat against the ground. 5. Repeat with your other leg. Repeat __________ times. Complete this exercise __________ times a day. Posture and body mechanics Good posture and healthy body mechanics can help to relieve stress in your  body's tissues and joints. Body mechanics refers to the movements and positions of your body while you do your daily activities. Posture is part of body mechanics. Good posture means:  Your spine is in its natural S-curve position (neutral).  Your shoulders are pulled back slightly.  Your head is not tipped forward. Follow these guidelines to improve your posture and body mechanics in your everyday activities. Standing   When standing, keep your spine neutral and your feet about hip width apart. Keep a slight bend in your knees. Your ears, shoulders, and hips should line up.  When you do a task in which you stand in one place for a long time, place one foot up on a stable object that is 2-4 inches (5-10 cm) high, such as a footstool. This helps keep your spine neutral. Sitting   When sitting, keep your spine neutral and  keep your feet flat on the floor. Use a footrest, if necessary, and keep your thighs parallel to the floor. Avoid rounding your shoulders, and avoid tilting your head forward.  When working at a desk or a computer, keep your desk at a height where your hands are slightly lower than your elbows. Slide your chair under your desk so you are close enough to maintain good posture.  When working at a computer, place your monitor at a height where you are looking straight ahead and you do not have to tilt your head forward or downward to look at the screen. Resting  When lying down and resting, avoid positions that are most painful for you.  If you have pain with activities such as sitting, bending, stooping, or squatting, lie in a position in which your body does not bend very much. For example, avoid curling up on your side with your arms and knees near your chest (fetal position).  If you have pain with activities such as standing for a long time or reaching with your arms, lie with your spine in a neutral position and bend your knees slightly. Try the following  positions: ? Lying on your side with a pillow between your knees. ? Lying on your back with a pillow under your knees. Lifting   When lifting objects, keep your feet at least shoulder width apart and tighten your abdominal muscles.  Bend your knees and hips and keep your spine neutral. It is important to lift using the strength of your legs, not your back. Do not lock your knees straight out.  Always ask for help to lift heavy or awkward objects. This information is not intended to replace advice given to you by your health care provider. Make sure you discuss any questions you have with your health care provider. Document Revised: 03/17/2019 Document Reviewed: 12/15/2018 Elsevier Patient Education  2020 [employer].

## 2020-06-27 NOTE — Progress Notes (Signed)
° °  Subjective:    Patient ID: Pamela [name], female    DOB: Dec 15, 1980, 39 y.o.   MRN: [id number]  HPI Pt having sciatica pain on left side. More in left hip/leg area. Has been going on for about one month and a half. Pt has tried Ibuprofen and stretches. Moving around seems to help more.   Has tried ice, Tylenol, Ibuprofen with no relief.   Has been sleeping in a chair to be near her daughter who has disabilities for about 2 months. No specific history of injury.  No change in bowel or bladder habits.  Remote history of lumbar surgery.     Review of Systems  Musculoskeletal: Positive for back pain.       Lower left back/hip pain radiating down left buttock,  posterior left leg to achilles. Pain feels like spasm, burning, constant.  Worse after sitting for a while, better with movement.    No weakness or numbness of the left leg or foot.      Objective:   Physical Exam Cardiovascular:     Rate and Rhythm: Normal rate and regular rhythm.  Pulmonary:     Effort: Pulmonary effort is normal.     Breath sounds: Normal breath sounds.  Musculoskeletal:     Comments: SLR left positive; can only move to 30 degrees SLR right neg.   Slight difficulty when getting up from chair to table and back to chair.  Neurological:     Mental Status: She is alert.     Comments: Patellar and achilles reflexes 2+        Assessment & Plan:   Problem List Items Addressed This Visit      Other   Acute left-sided low back pain with left-sided sciatica - Primary most likely related to sleeping in chair   Relevant Medications   naproxen (NAPROSYN) 500 MG tablet     Meds ordered this encounter  Medications   amitriptyline (ELAVIL) 10 MG tablet    Sig: Take 1 tablet (10 mg total) by mouth at bedtime. For back pain    Dispense:  30 tablet    Refill:  2   naproxen (NAPROSYN) 500 MG tablet    Sig: Take 1 tablet (500 mg total) by mouth 2 (two) times daily with a meal. PRN pain    Dispense:  30  tablet    Refill:  0   Ice packs or heat to lower back.  Salonpas / lidocaine patch to lower back.  May try Biofreeze.  Back exercises given.  Don't sleep in chair, try air mattress or other type of bed.  Elavil 10 mg at bedtime. DC and call if any problems.  May take Acetaminophen.  Call back in 10-14 days if no improvement, sooner if worse. Warning signs reviewed.    Return if symptoms worsen or fail to improve.

## 2020-06-30 ENCOUNTER — Encounter: Payer: Self-pay | Admitting: Nurse Practitioner

## 2020-06-30 NOTE — Progress Notes (Signed)
   Subjective:    Patient ID: Pamela [name], female    DOB: 05/10/1981, 39 y.o.   MRN: [id number]  HPI  See above note.   Review of Systems     Objective:   Physical Exam        Assessment & Plan:

## 2021-06-26 ENCOUNTER — Encounter: Payer: Self-pay | Admitting: Nurse Practitioner

## 2021-06-26 ENCOUNTER — Ambulatory Visit: Payer: Medicaid Other | Admitting: Nurse Practitioner

## 2021-07-03 ENCOUNTER — Other Ambulatory Visit: Payer: Self-pay

## 2021-07-03 ENCOUNTER — Ambulatory Visit (INDEPENDENT_AMBULATORY_CARE_PROVIDER_SITE_OTHER): Payer: Medicaid Other | Admitting: Nurse Practitioner

## 2021-07-03 VITALS — BP 112/73 | Temp 98.1°F | Ht 64.0 in | Wt 123.6 lb

## 2021-07-03 DIAGNOSIS — K219 Gastro-esophageal reflux disease without esophagitis: Secondary | ICD-10-CM

## 2021-07-03 MED ORDER — PANTOPRAZOLE SODIUM 40 MG PO TBEC: 40.0000 mg | DELAYED_RELEASE_TABLET | Freq: Every day | ORAL | 0 refills | Status: DC

## 2021-07-03 MED ORDER — ONDANSETRON 8 MG PO TBDP: 8.0000 mg | ORAL_TABLET | Freq: Three times a day (TID) | ORAL | 0 refills | Status: DC | PRN

## 2021-07-03 MED ORDER — SUCRALFATE 1 G PO TABS: 1.0000 g | ORAL_TABLET | Freq: Three times a day (TID) | ORAL | 0 refills | Status: DC

## 2021-07-03 NOTE — Progress Notes (Signed)
Subjective:    Patient ID: Pamela [name], female    DOB: 1981/04/21, 40 y.o.   MRN: [id number]  HPI  Patient arrives with stomach pain and vomiting since April when her daughter was in the hospital Her daughter passed away in 04-26-23.  Symptoms have been persistent since then.  States it became worse after the visitation after her death.  Describes a starving feeling and cramping, will eat food which provide some temporary relief.  States she has to eat small amounts frequently to keep the symptoms from coming back.  Describes nausea all day.  Reflux symptoms and bloating at times.  Sometimes even smells will make her nauseated.  Can have severe cramps for days at a time.  Can feel the stomach acid at times.  Has very little appetite.  Limited caffeine intake.  Has cut back her smoking to half pack per day or less.  Limited NSAID use.  Alternating cycles of diarrhea and constipation.  No blood in her stool.  Color normal limit.  No fevers.  Has not had a menstrual cycle since April, last sexual contact was back in March.  Has had a tubal ligation. Has been struggling since losing her daughter.  Has other children, she has been involved with them which she states helps.  Has a strong support system through hospice and palliative care including counseling available if she wishes to start this, patient feels that she does not want to do that at this time. Denies suicidal or homicidal thoughts at this time. Depression screen [facility] 2/9 07/03/2021  Decreased Interest 3  Down, Depressed, Hopeless 3  PHQ - 2 Score 6  Altered sleeping 3  Tired, decreased energy 3  Change in appetite 3  Feeling bad or failure about yourself  3  Trouble concentrating 0  Moving slowly or fidgety/restless 0  Suicidal thoughts 0  PHQ-9 Score 18        Objective:   Physical Exam NAD.  Alert, oriented.  Crying and tearful at times during the office visit particularly when she discusses her daughter.  Dressed appropriately.   Making good eye contact.  Speech clear.  Thoughts logical coherent and relevant.  Lungs clear.  Heart regular rate and rhythm.  Abdomen soft nondistended with active bowel sounds x4; minimal generalized lower abdominal tenderness, mild upper epigastric area tenderness to deep palpation.  No obvious masses.  No rebound or guarding.  Today's Vitals   07/03/21 1447  BP: 112/73  Temp: 98.1 F (36.7 C)  TempSrc: Oral  Weight: 123 lb 9.6 oz (56.1 kg)  Height: 5\' 4"  (1.626 m)   Body mass index is 21.22 kg/m.        Assessment & Plan:   Problem List Items Addressed This Visit       Digestive   Gastroesophageal reflux disease without esophagitis - Primary   Relevant Medications   pantoprazole (PROTONIX) 40 MG tablet   sucralfate (CARAFATE) 1 g tablet   ondansetron (ZOFRAN ODT) 8 MG disintegrating tablet   Meds ordered this encounter  Medications   pantoprazole (PROTONIX) 40 MG tablet    Sig: Take 1 tablet (40 mg total) by mouth daily. For acid reflux    Dispense:  30 tablet    Refill:  0    Order Specific Question:   Supervising Provider    Answer:   A [[id number]]   sucralfate (CARAFATE) 1 g tablet    Sig: Take 1 tablet (1 g total) by mouth 4 (  four) times daily -  with meals and at bedtime. Prn acid reflux    Dispense:  60 tablet    Refill:  0    Order Specific Question:   Supervising Provider    Answer:   Lilyan Punt A [[id number]]   ondansetron (ZOFRAN ODT) 8 MG disintegrating tablet    Sig: Take 1 tablet (8 mg total) by mouth every 8 (eight) hours as needed for nausea or vomiting.    Dispense:  30 tablet    Refill:  0    Order Specific Question:   Supervising Provider    Answer:   Lilyan Punt A [id number]   Given written and verbal information on lifestyle factors affecting her reflux symptoms. Patient defers medication for depression at this time. Encourage patient to reconsider bereavement counseling that is available through hospice. Carafate as directed for  breakthrough reflux symptoms. Zofran as directed for nausea. Recheck in 2 weeks, warning signs reviewed.  Call the office or go to urgent care/ED sooner if worse. 30 minutes was spent with the patient including previsit chart review, time spent with patient, discussion of health issues, review of data including medical record, and documentation of the visit.

## 2021-07-04 ENCOUNTER — Encounter: Payer: Self-pay | Admitting: Nurse Practitioner

## 2021-07-04 DIAGNOSIS — K219 Gastro-esophageal reflux disease without esophagitis: Secondary | ICD-10-CM | POA: Insufficient documentation

## 2021-07-18 ENCOUNTER — Ambulatory Visit: Payer: Medicaid Other | Admitting: Nurse Practitioner

## 2021-07-18 ENCOUNTER — Encounter: Payer: Self-pay | Admitting: Nurse Practitioner

## 2021-07-30 ENCOUNTER — Ambulatory Visit: Payer: Medicaid Other | Admitting: Adult Health

## 2021-07-30 ENCOUNTER — Other Ambulatory Visit: Payer: Self-pay

## 2021-07-30 ENCOUNTER — Encounter: Payer: Self-pay | Admitting: Adult Health

## 2021-07-30 VITALS — BP 118/81 | HR 128 | Ht 64.0 in | Wt 125.8 lb

## 2021-07-30 DIAGNOSIS — R232 Flushing: Secondary | ICD-10-CM

## 2021-07-30 DIAGNOSIS — R1011 Right upper quadrant pain: Secondary | ICD-10-CM

## 2021-07-30 DIAGNOSIS — F32A Depression, unspecified: Secondary | ICD-10-CM | POA: Diagnosis not present

## 2021-07-30 DIAGNOSIS — F419 Anxiety disorder, unspecified: Secondary | ICD-10-CM | POA: Diagnosis not present

## 2021-07-30 DIAGNOSIS — N926 Irregular menstruation, unspecified: Secondary | ICD-10-CM

## 2021-07-30 DIAGNOSIS — R1013 Epigastric pain: Secondary | ICD-10-CM | POA: Insufficient documentation

## 2021-07-30 NOTE — Progress Notes (Signed)
  Subjective:     Patient ID: Pamela [name], female   DOB: [birth year], 40 y.o.   MRN: [id number]  HPI Pamela [name] is a 40 year old white female, divorced, [id number], in with numerous complaints, ?menopause. Has hot flashes and night sweats,is moody and does not sleep well. Has pain upper abdomen and nausea and vomiting. Her daughter died in 2023-05-11 she had SMA 1. She has seen Pamela [name] and given meds, protonix not helping but Zofran does. PCP is [facility].   Review of Systems + hot flashes +irregular periods +night sweats +moody Not sleeping well Pain in upper abdomen  Has had nausea and vomiting +constipation Hands tingling and feet feel numb  Reviewed past medical,surgical, social and family history. Reviewed medications and allergies.     Objective:   Physical Exam BP 118/81 (BP Location: Left Arm, Patient Position: Sitting, Cuff Size: Normal)   Pulse (!) 128   Ht 5\' 4"  (1.626 m)   Wt 125 lb 12.8 oz (57.1 kg)   LMP 02/13/2021 (Approximate) Comment: LMP WAS IN MARCH  Breastfeeding No   BMI 21.59 kg/m     Skin warm and dry. Neck: mid line trachea, normal thyroid, good ROM, no lymphadenopathy noted. Lungs: clear to ausculation bilaterally. Cardiovascular: regular rate and rhythm. Abdomen is soft and +tenderness RUQ and epigastric area. She declines pap today.  AA is 6 Fall risk is low Depression screen [facility] 2/9 07/30/2021 07/03/2021 04/01/2016  Decreased Interest 3 3 0  Down, Depressed, Hopeless 3 3 0  PHQ - 2 Score 6 6 0  Altered sleeping 3 3 -  Tired, decreased energy 3 3 -  Change in appetite 3 3 -  Feeling bad or failure about yourself  3 3 -  Trouble concentrating 3 0 -  Moving slowly or fidgety/restless 3 0 -  Suicidal thoughts 3 0 -  PHQ-9 Score 27 18 -    GAD 7 : Generalized Anxiety Score 07/30/2021  Nervous, Anxious, on Edge 3  Control/stop worrying 3  Worry too much - different things 3  Trouble relaxing 3  Restless 3  Easily annoyed or irritable 3  Afraid - awful  might happen 3  Total GAD 7 Score 21      Upstream - 07/30/21 1608       Pregnancy Intention Screening   Does the patient want to become pregnant in the next year? No    Does the patient's partner want to become pregnant in the next year? No    Would the patient like to discuss contraceptive options today? No      Contraception Wrap Up   Current Method Female Sterilization    End Method Female Sterilization    Contraception Counseling Provided No                Assessment:     1. Hot flashes Will check labs  - TSH - Follicle stimulating hormone  2. Irregular periods  - CBC - TSH - Follicle stimulating hormone  3. Epigastric pain 08/01/21 scheduled for 08/07/21 at [facility], at 10:30 am - [facility] Abdomen Complete; Future - Comprehensive metabolic panel - Lipase - Amylase  4. RUQ pain [place] at [facility] 08/07/21 - 10/07/21 Abdomen Complete; Future - Comprehensive metabolic panel - Lipase - Amylase     5. Anxiety and Depression She goes to counseling, and denies any SI or HI   Plan:     Return in 3 weeks for pap and physical

## 2021-07-31 LAB — COMPREHENSIVE METABOLIC PANEL
ALT: 67 IU/L — ABNORMAL HIGH (ref 0–32)
AST: 231 IU/L — ABNORMAL HIGH (ref 0–40)
Albumin/Globulin Ratio: 1.5 (ref 1.2–2.2)
Albumin: 3.8 g/dL (ref 3.8–4.8)
Alkaline Phosphatase: 256 IU/L — ABNORMAL HIGH (ref 44–121)
BUN/Creatinine Ratio: 7 — ABNORMAL LOW (ref 9–23)
BUN: 3 mg/dL — ABNORMAL LOW (ref 6–24)
Bilirubin Total: 0.9 mg/dL (ref 0.0–1.2)
CO2: 28 mmol/L (ref 20–29)
Calcium: 8.6 mg/dL — ABNORMAL LOW (ref 8.7–10.2)
Chloride: 97 mmol/L (ref 96–106)
Creatinine, Ser: 0.44 mg/dL — ABNORMAL LOW (ref 0.57–1.00)
Globulin, Total: 2.5 g/dL (ref 1.5–4.5)
Glucose: 112 mg/dL — ABNORMAL HIGH (ref 65–99)
Potassium: 3.1 mmol/L — ABNORMAL LOW (ref 3.5–5.2)
Sodium: 140 mmol/L (ref 134–144)
Total Protein: 6.3 g/dL (ref 6.0–8.5)
eGFR: 125 mL/min/{1.73_m2} (ref >59)

## 2021-07-31 LAB — FOLLICLE STIMULATING HORMONE: FSH: 74.8 m[IU]/mL

## 2021-07-31 LAB — TSH: TSH: 1.4 u[IU]/mL (ref 0.450–4.500)

## 2021-07-31 LAB — CBC
Hematocrit: 38.2 % (ref 34.0–46.6)
Hemoglobin: 14 g/dL (ref 11.1–15.9)
MCH: 36.1 pg — ABNORMAL HIGH (ref 26.6–33.0)
MCHC: 36.6 g/dL — ABNORMAL HIGH (ref 31.5–35.7)
MCV: 99 fL — ABNORMAL HIGH (ref 79–97)
Platelets: 273 10*3/uL (ref 150–450)
RBC: 3.88 x10E6/uL (ref 3.77–5.28)
RDW: 15.5 % — ABNORMAL HIGH (ref 11.7–15.4)
WBC: 10.8 10*3/uL (ref 3.4–10.8)

## 2021-07-31 LAB — LIPASE: Lipase: 17 U/L (ref 14–72)

## 2021-07-31 LAB — AMYLASE: Amylase: 35 U/L (ref 31–110)

## 2021-08-01 ENCOUNTER — Telehealth: Payer: Self-pay | Admitting: Adult Health

## 2021-08-01 ENCOUNTER — Encounter: Payer: Self-pay | Admitting: Adult Health

## 2021-08-01 DIAGNOSIS — R748 Abnormal levels of other serum enzymes: Secondary | ICD-10-CM | POA: Insufficient documentation

## 2021-08-01 HISTORY — DX: Abnormal levels of other serum enzymes: R74.8

## 2021-08-01 MED ORDER — POTASSIUM CHLORIDE CRYS ER 10 MEQ PO TBCR: 10.0000 meq | EXTENDED_RELEASE_TABLET | Freq: Every day | ORAL | 0 refills | Status: DC

## 2021-08-01 NOTE — Telephone Encounter (Signed)
Pt aware of labs, thyroid is normal, FSH is elevated 74.8,amylase and lipase normal, lever enzymes elevated, as is alk phos, no alcohol get abd [place] 9/1 and see me 9/14, will recheck CMP then, K is 3.1 will rx klor-con 10 meq for 14 days

## 2021-08-07 ENCOUNTER — Ambulatory Visit (HOSPITAL_COMMUNITY): Admission: RE | Admit: 2021-08-07 | Payer: Medicaid Other | Source: Ambulatory Visit

## 2021-08-20 ENCOUNTER — Other Ambulatory Visit: Payer: Self-pay

## 2021-08-20 ENCOUNTER — Encounter: Payer: Self-pay | Admitting: Adult Health

## 2021-08-20 ENCOUNTER — Other Ambulatory Visit (HOSPITAL_COMMUNITY)
Admission: RE | Admit: 2021-08-20 | Discharge: 2021-08-20 | Disposition: A | Discharge status: Home / Self Care | Payer: Medicaid Other | Source: Ambulatory Visit | Attending: Adult Health | Admitting: Adult Health

## 2021-08-20 ENCOUNTER — Ambulatory Visit (INDEPENDENT_AMBULATORY_CARE_PROVIDER_SITE_OTHER): Payer: Medicaid Other | Admitting: Adult Health

## 2021-08-20 VITALS — BP 103/74 | HR 124 | Ht 64.0 in | Wt 125.5 lb

## 2021-08-20 DIAGNOSIS — R1011 Right upper quadrant pain: Secondary | ICD-10-CM | POA: Diagnosis not present

## 2021-08-20 DIAGNOSIS — Z1211 Encounter for screening for malignant neoplasm of colon: Secondary | ICD-10-CM | POA: Diagnosis not present

## 2021-08-20 DIAGNOSIS — F419 Anxiety disorder, unspecified: Secondary | ICD-10-CM

## 2021-08-20 DIAGNOSIS — Z0000 Encounter for general adult medical examination without abnormal findings: Secondary | ICD-10-CM

## 2021-08-20 DIAGNOSIS — Z01419 Encounter for gynecological examination (general) (routine) without abnormal findings: Secondary | ICD-10-CM | POA: Insufficient documentation

## 2021-08-20 DIAGNOSIS — R748 Abnormal levels of other serum enzymes: Secondary | ICD-10-CM

## 2021-08-20 DIAGNOSIS — N951 Menopausal and female climacteric states: Secondary | ICD-10-CM

## 2021-08-20 DIAGNOSIS — F32A Depression, unspecified: Secondary | ICD-10-CM

## 2021-08-20 DIAGNOSIS — Z1231 Encounter for screening mammogram for malignant neoplasm of breast: Secondary | ICD-10-CM | POA: Diagnosis not present

## 2021-08-20 LAB — HEMOCCULT GUIAC POC 1CARD (OFFICE): Fecal Occult Blood, POC: NEGATIVE

## 2021-08-20 NOTE — Progress Notes (Signed)
Patient ID: Pamela [name], female   DOB: 1981/02/20, 40 y.o.   MRN: [id number] History of Present Illness: Pamela [name] is a 40 year old white female,divorced, [id number],AT for well woman gyn exam and pap. She does not have pain RUQ as before, missed [place], will reschedule. Her liver enzymes were elevated, and she has limited her alcohol, will recheck today. PCP is [facility].    Current Medications, Allergies, Past Medical History, Past Surgical History, Family History and Social History were reviewed in [employer] record.     Review of Systems:  Patient denies any headaches, hearing loss, fatigue, blurred vision, shortness of breath, chest pain, abdominal pain, problems with bowel movements, urination, or intercourse. No joint pain or mood swings.  Hands and feet cold, had normal TSH.   Physical Exam:BP 103/74 (BP Location: Left Arm, Patient Position: Sitting, Cuff Size: Normal)   Pulse (!) 124   Ht 5\' 4"  (1.626 m)   Wt 125 lb 8 oz (56.9 kg)   BMI 21.54 kg/m   General:  Well developed, well nourished, no acute distress Skin:  Warm and dry Neck:  Midline trachea, normal thyroid, good ROM, no lymphadenopathy Lungs; Clear to auscultation bilaterally Breast:  No dominant palpable mass, retraction, or nipple discharge Cardiovascular: Regular rate and rhythm Abdomen:  Soft, non tender, no hepatosplenomegaly Pelvic:  External genitalia is normal in appearance, no lesions.  The vagina is normal in appearance. Urethra has no lesions or masses. The cervix is bulbous.Pap with HR HPV genotyping performed.  Uterus is felt to be normal size, shape, and contour.  No adnexal masses or tenderness noted.Bladder is non tender, no masses felt. Rectal: Good sphincter tone, no polyps, or hemorrhoids felt.  Hemoccult negative. Extremities/musculoskeletal:  No swelling or varicosities noted, no clubbing or cyanosis Psych:  No mood changes, alert and cooperative,seems happy AA is 6 Fall risk  is low Depression screen [facility] 2/9 08/20/2021 07/30/2021 07/03/2021  Decreased Interest 3 3 3   Down, Depressed, Hopeless 3 3 3   PHQ - 2 Score 6 6 6   Altered sleeping 3 3 3   Tired, decreased energy 3 3 3   Change in appetite 3 3 3   Feeling bad or failure about yourself  3 3 3   Trouble concentrating 3 3 0  Moving slowly or fidgety/restless 3 3 0  Suicidal thoughts 3 3 0  PHQ-9 Score 27 27 18    She declines meds, going to counseling GAD 7 : Generalized Anxiety Score 08/20/2021 07/30/2021  Nervous, Anxious, on Edge 3 3  Control/stop worrying 3 3  Worry too much - different things 3 3  Trouble relaxing 3 3  Restless 3 3  Easily annoyed or irritable 3 3  Afraid - awful might happen 3 3  Total GAD 7 Score 21 21      Upstream - 08/20/21 1428       Pregnancy Intention Screening   Does the patient want to become pregnant in the next year? No    Does the patient's partner want to become pregnant in the next year? No    Would the patient like to discuss contraceptive options today? No      Contraception Wrap Up   Current Method Female Sterilization    End Method Female Sterilization    Contraception Counseling Provided No            Examination chaperoned by LPN   Impression and Plan: 1. Routine general medical examination at a health care facility  -  Cytology - PAP( [place])  2. Anxiety and depression Going to counseling  3. Elevated liver enzymes Will check labs - Comprehensive metabolic panel - Iron, TIBC and Ferritin Panel  4. RUQ pain [place] rescheduled to 9/22 at 11:30 am at [facility]  5. Encounter for gynecological examination with Papanicolaou smear of cervix Pap sent Physical in 1 year Pap in 3 if normal  6. Encounter for screening fecal occult blood testing  - POCT occult blood stool  7. Screening mammogram for breast cancer Scheduled for her 9/23 at 1:30 pm at [facility] - MM 3D SCREEN BREAST BILATERAL; Future  8. Menopausal state FSH was  74.8 07/30/21  Will talk when labs back

## 2021-08-21 LAB — IRON,TIBC AND FERRITIN PANEL
Ferritin: 128 ng/mL (ref 15–150)
Iron Saturation: 77 % (ref 15–55)
Iron: 224 ug/dL — ABNORMAL HIGH (ref 27–159)
Total Iron Binding Capacity: 291 ug/dL (ref 250–450)
UIBC: 67 ug/dL — ABNORMAL LOW (ref 131–425)

## 2021-08-21 LAB — COMPREHENSIVE METABOLIC PANEL
ALT: 84 IU/L — ABNORMAL HIGH (ref 0–32)
AST: 245 IU/L — ABNORMAL HIGH (ref 0–40)
Albumin/Globulin Ratio: 1.3 (ref 1.2–2.2)
Albumin: 3.8 g/dL (ref 3.8–4.8)
Alkaline Phosphatase: 288 IU/L — ABNORMAL HIGH (ref 44–121)
BUN/Creatinine Ratio: 5 — ABNORMAL LOW (ref 9–23)
BUN: 2 mg/dL — ABNORMAL LOW (ref 6–24)
Bilirubin Total: 1.3 mg/dL — ABNORMAL HIGH (ref 0.0–1.2)
CO2: 25 mmol/L (ref 20–29)
Calcium: 9.6 mg/dL (ref 8.7–10.2)
Chloride: 95 mmol/L — ABNORMAL LOW (ref 96–106)
Creatinine, Ser: 0.42 mg/dL — ABNORMAL LOW (ref 0.57–1.00)
Globulin, Total: 3 g/dL (ref 1.5–4.5)
Glucose: 179 mg/dL — ABNORMAL HIGH (ref 65–99)
Potassium: 3.3 mmol/L — ABNORMAL LOW (ref 3.5–5.2)
Sodium: 141 mmol/L (ref 134–144)
Total Protein: 6.8 g/dL (ref 6.0–8.5)
eGFR: 126 mL/min/{1.73_m2} (ref >59)

## 2021-08-26 LAB — CYTOLOGY - PAP
Adequacy: ABSENT
Comment: NEGATIVE
Diagnosis: UNDETERMINED — AB
High risk HPV: NEGATIVE

## 2021-08-28 ENCOUNTER — Ambulatory Visit (HOSPITAL_COMMUNITY)
Admission: RE | Admit: 2021-08-28 | Discharge: 2021-08-28 | Disposition: A | Discharge status: Home / Self Care | Payer: Medicaid Other | Source: Ambulatory Visit | Attending: Adult Health | Admitting: Adult Health

## 2021-08-28 ENCOUNTER — Other Ambulatory Visit: Payer: Self-pay

## 2021-08-28 DIAGNOSIS — K7689 Other specified diseases of liver: Secondary | ICD-10-CM | POA: Diagnosis not present

## 2021-08-28 DIAGNOSIS — R1011 Right upper quadrant pain: Secondary | ICD-10-CM | POA: Diagnosis not present

## 2021-08-28 DIAGNOSIS — R1013 Epigastric pain: Secondary | ICD-10-CM | POA: Insufficient documentation

## 2021-08-28 DIAGNOSIS — K838 Other specified diseases of biliary tract: Secondary | ICD-10-CM | POA: Diagnosis not present

## 2021-08-28 DIAGNOSIS — K828 Other specified diseases of gallbladder: Secondary | ICD-10-CM | POA: Diagnosis not present

## 2021-08-28 DIAGNOSIS — R7989 Other specified abnormal findings of blood chemistry: Secondary | ICD-10-CM | POA: Diagnosis not present

## 2021-08-29 ENCOUNTER — Ambulatory Visit (HOSPITAL_COMMUNITY): Payer: Medicaid Other

## 2021-09-01 ENCOUNTER — Encounter: Payer: Self-pay | Admitting: Adult Health

## 2021-09-01 ENCOUNTER — Telehealth: Payer: Self-pay | Admitting: Adult Health

## 2021-09-01 DIAGNOSIS — R87610 Atypical squamous cells of undetermined significance on cytologic smear of cervix (ASC-US): Secondary | ICD-10-CM

## 2021-09-01 DIAGNOSIS — R748 Abnormal levels of other serum enzymes: Secondary | ICD-10-CM

## 2021-09-01 DIAGNOSIS — R1011 Right upper quadrant pain: Secondary | ICD-10-CM

## 2021-09-01 DIAGNOSIS — K828 Other specified diseases of gallbladder: Secondary | ICD-10-CM

## 2021-09-01 HISTORY — DX: Other specified diseases of gallbladder: K82.8

## 2021-09-01 HISTORY — DX: Atypical squamous cells of undetermined significance on cytologic smear of cervix (ASC-US): R87.610

## 2021-09-01 NOTE — Telephone Encounter (Signed)
Pt aware that US showed sludge in gall bladder and liver enzymes still going up, will refer to Dr Lovell Sheehan

## 2021-09-11 ENCOUNTER — Ambulatory Visit: Payer: Medicaid Other | Admitting: General Surgery

## 2021-09-18 ENCOUNTER — Ambulatory Visit: Payer: Medicaid Other | Admitting: General Surgery

## 2021-09-18 ENCOUNTER — Encounter: Payer: Self-pay | Admitting: General Surgery

## 2021-09-18 ENCOUNTER — Other Ambulatory Visit: Payer: Self-pay

## 2021-09-18 VITALS — BP 106/60 | HR 120 | Temp 98.4°F | Resp 14 | Ht 64.0 in | Wt 126.0 lb

## 2021-09-18 DIAGNOSIS — R7401 Elevation of levels of liver transaminase levels: Secondary | ICD-10-CM | POA: Diagnosis not present

## 2021-09-18 DIAGNOSIS — K8020 Calculus of gallbladder without cholecystitis without obstruction: Secondary | ICD-10-CM | POA: Diagnosis not present

## 2021-09-18 NOTE — Progress Notes (Signed)
Pamela [name]; [id number]; 1981/01/20   HPI Patient is a 40 year old white female who was referred to my care by Cyril Mourning and Dr. Lilyan Punt for evaluation treatment of biliary sludge and transaminitis.  Patient states she has been having symptoms of upper abdominal discomfort, bloating, nausea, and intermittent vomiting for many months.  She recently had blood work which revealed transaminitis.  Ultrasound the gallbladder revealed biliary sludge with a slightly dilated hepatobiliary tree.  No choledocholithiasis was seen at the time.  She denies any fever, chills, or jaundice.  She has had her symptoms persist since earlier this year. Past Medical History:  Diagnosis Date   Abnormal vaginal Pap smear    ASCUS of cervix with negative high risk HPV 09/01/2021   Repeat papin 3 years, per [facility], 5 year risk for CIN 3+ is 0.40%   Depression    Elevated liver enzymes 08/01/2021   Stop alcohol, recheck at appt, get [place] 9/1   Migraines    Neuropathy    Pregnant 06/19/2015   Sludge in gallbladder 09/01/2021   Refer to Dr Lovell Sheehan   Vaginal Pap smear, abnormal     Past Surgical History:  Procedure Laterality Date   BACK SURGERY     CESAREAN SECTION  x 2   CESAREAN SECTION WITH BILATERAL TUBAL LIGATION Bilateral 02/11/2016   Procedure: REPEAT CESAREAN SECTION WITH BILATERAL TUBAL LIGATION;  Surgeon: Tilda Burrow, MD;  Location: [facility];  Service: Obstetrics;  Laterality: Bilateral;   TONSILLECTOMY AND ADENOIDECTOMY      Family History  Problem Relation Age of Onset   Dementia Paternal Grandfather    Dementia Paternal Grandmother     Current Outpatient Medications on File Prior to Visit  Medication Sig Dispense Refill   aspirin EC 81 MG tablet Take 81 mg by mouth daily. Swallow whole.     gabapentin (NEURONTIN) 100 MG capsule Take 100 mg by mouth 3 (three) times daily.     ondansetron (ZOFRAN ODT) 8 MG disintegrating tablet Take 1 tablet (8 mg total) by mouth every 8 (eight) hours  as needed for nausea or vomiting. 30 tablet 0   pantoprazole (PROTONIX) 40 MG tablet Take 1 tablet (40 mg total) by mouth daily. For acid reflux 30 tablet 0   SUBOXONE 8-2 MG FILM SMARTSIG:0.25 Strip(s) Sublingual 4 Times Daily     No current facility-administered medications on file prior to visit.    Allergies  Allergen Reactions   Zomig [Zolmitriptan] Shortness Of Breath and Swelling    Imitrex is tolerated well    Social History   Substance and Sexual Activity  Alcohol Use Yes   Comment: occ    Social History   Tobacco Use  Smoking Status Every Day   Packs/day: 1.00   Years: 19.00   Pack years: 19.00   Types: Cigarettes   Start date: 08/02/2007  Smokeless Tobacco Never  Tobacco Comments   smokes 6 cig per day    Review of Systems  Constitutional: Negative.   HENT: Negative.    Eyes:  Positive for blurred vision.  Respiratory: Negative.    Cardiovascular: Negative.   Gastrointestinal:  Positive for heartburn and nausea. Negative for abdominal pain.  Genitourinary: Negative.   Musculoskeletal: Negative.   Skin: Negative.   Neurological:  Positive for sensory change.  Endo/Heme/Allergies: Negative.   Psychiatric/Behavioral: Negative.     Objective   Vitals:   09/18/21 0937  BP: 106/60  Pulse: (!) 120  Resp: 14  Temp: 98.4 F (36.9  C)  SpO2: 96%    Physical Exam Vitals reviewed.  Constitutional:      Appearance: Normal appearance. She is normal weight. She is not ill-appearing.  HENT:     Head: Normocephalic and atraumatic.  Eyes:     General: No scleral icterus. Cardiovascular:     Rate and Rhythm: Normal rate and regular rhythm.     Heart sounds: Normal heart sounds. No murmur heard.   No friction rub. No gallop.  Pulmonary:     Effort: Pulmonary effort is normal. No respiratory distress.     Breath sounds: Normal breath sounds. No stridor. No wheezing, rhonchi or rales.  Abdominal:     General: Abdomen is flat. Bowel sounds are normal.  There is no distension.     Palpations: Abdomen is soft. There is no mass.     Tenderness: There is no abdominal tenderness. There is no guarding or rebound.     Hernia: No hernia is present.  Skin:    General: Skin is warm and dry.  Neurological:     Mental Status: She is alert and oriented to person, place, and time.   Labs reviewed, ultrasound report reviewed Assessment  Biliary colic secondary to biliary sludge, transaminitis Plan  We will get MRCP prior to laparoscopic cholecystectomy.  The risks and benefits of the procedure including bleeding, infection, hepatobiliary injury, and the possibility of an open procedure were fully explained to the patient, who gave informed consent.  Patient will return to go over the MRCP results.

## 2021-10-10 ENCOUNTER — Ambulatory Visit (HOSPITAL_COMMUNITY)
Admission: RE | Admit: 2021-10-10 | Discharge: 2021-10-10 | Disposition: A | Discharge status: Home / Self Care | Payer: Medicaid Other | Source: Ambulatory Visit | Attending: General Surgery | Admitting: General Surgery

## 2021-10-10 ENCOUNTER — Other Ambulatory Visit: Payer: Self-pay | Admitting: General Surgery

## 2021-10-10 ENCOUNTER — Other Ambulatory Visit: Payer: Self-pay

## 2021-10-10 DIAGNOSIS — K8020 Calculus of gallbladder without cholecystitis without obstruction: Secondary | ICD-10-CM

## 2021-10-10 DIAGNOSIS — K838 Other specified diseases of biliary tract: Secondary | ICD-10-CM | POA: Diagnosis not present

## 2021-10-10 DIAGNOSIS — K828 Other specified diseases of gallbladder: Secondary | ICD-10-CM | POA: Diagnosis not present

## 2021-10-10 DIAGNOSIS — K760 Fatty (change of) liver, not elsewhere classified: Secondary | ICD-10-CM | POA: Diagnosis not present

## 2021-10-10 DIAGNOSIS — R7989 Other specified abnormal findings of blood chemistry: Secondary | ICD-10-CM | POA: Diagnosis not present

## 2021-10-10 MED ORDER — GADOBUTROL 1 MMOL/ML IV SOLN
6.0000 mL | Freq: Once | INTRAVENOUS | Status: AC | PRN
  Administered 2021-10-10: 6 mL via INTRAVENOUS

## 2021-10-16 NOTE — H&P (Signed)
Pamela [name]; [id number]; 08/19/1981   HPI Patient is a 40-year-old white female who was referred to my care by Jennifer Griffin and Dr. Scott Luking for evaluation treatment of biliary sludge and transaminitis.  Patient states she has been having symptoms of upper abdominal discomfort, bloating, nausea, and intermittent vomiting for many months.  She recently had blood work which revealed transaminitis.  Ultrasound the gallbladder revealed biliary sludge with a slightly dilated hepatobiliary tree.  No choledocholithiasis was seen at the time.  She denies any fever, chills, or jaundice.  She has had her symptoms persist since earlier this year. Past Medical History:  Diagnosis Date   Abnormal vaginal Pap smear    ASCUS of cervix with negative high risk HPV 09/01/2021   Repeat papin 3 years, per [facility], 5 year risk for CIN 3+ is 0.40%   Depression    Elevated liver enzymes 08/01/2021   Stop alcohol, recheck at appt, get US 9/1   Migraines    Neuropathy    Pregnant 06/19/2015   Sludge in gallbladder 09/01/2021   Refer to Dr Rachna Schonberger   Vaginal Pap smear, abnormal     Past Surgical History:  Procedure Laterality Date   BACK SURGERY     CESAREAN SECTION  x 2   CESAREAN SECTION WITH BILATERAL TUBAL LIGATION Bilateral 02/11/2016   Procedure: REPEAT CESAREAN SECTION WITH BILATERAL TUBAL LIGATION;  Surgeon: John Ferguson V, MD;  Location: [facility];  Service: Obstetrics;  Laterality: Bilateral;   TONSILLECTOMY AND ADENOIDECTOMY      Family History  Problem Relation Age of Onset   Dementia Paternal Grandfather    Dementia Paternal Grandmother     Current Outpatient Medications on File Prior to Visit  Medication Sig Dispense Refill   aspirin EC 81 MG tablet Take 81 mg by mouth daily. Swallow whole.     gabapentin (NEURONTIN) 100 MG capsule Take 100 mg by mouth 3 (three) times daily.     ondansetron (ZOFRAN ODT) 8 MG disintegrating tablet Take 1 tablet (8 mg total) by mouth every 8 (eight) hours  as needed for nausea or vomiting. 30 tablet 0   pantoprazole (PROTONIX) 40 MG tablet Take 1 tablet (40 mg total) by mouth daily. For acid reflux 30 tablet 0   SUBOXONE 8-2 MG FILM SMARTSIG:0.25 Strip(s) Sublingual 4 Times Daily     No current facility-administered medications on file prior to visit.    Allergies  Allergen Reactions   Zomig [Zolmitriptan] Shortness Of Breath and Swelling    Imitrex is tolerated well    Social History   Substance and Sexual Activity  Alcohol Use Yes   Comment: occ    Social History   Tobacco Use  Smoking Status Every Day   Packs/day: 1.00   Years: 19.00   Pack years: 19.00   Types: Cigarettes   Start date: 08/02/2007  Smokeless Tobacco Never  Tobacco Comments   smokes 6 cig per day    Review of Systems  Constitutional: Negative.   HENT: Negative.    Eyes:  Positive for blurred vision.  Respiratory: Negative.    Cardiovascular: Negative.   Gastrointestinal:  Positive for heartburn and nausea. Negative for abdominal pain.  Genitourinary: Negative.   Musculoskeletal: Negative.   Skin: Negative.   Neurological:  Positive for sensory change.  Endo/Heme/Allergies: Negative.   Psychiatric/Behavioral: Negative.     Objective   Vitals:   09/18/21 0937  BP: 106/60  Pulse: (!) 120  Resp: 14  Temp: 98.4 F (36.9   C)  SpO2: 96%    Physical Exam Vitals reviewed.  Constitutional:      Appearance: Normal appearance. She is normal weight. She is not ill-appearing.  HENT:     Head: Normocephalic and atraumatic.  Eyes:     General: No scleral icterus. Cardiovascular:     Rate and Rhythm: Normal rate and regular rhythm.     Heart sounds: Normal heart sounds. No murmur heard.   No friction rub. No gallop.  Pulmonary:     Effort: Pulmonary effort is normal. No respiratory distress.     Breath sounds: Normal breath sounds. No stridor. No wheezing, rhonchi or rales.  Abdominal:     General: Abdomen is flat. Bowel sounds are normal.  There is no distension.     Palpations: Abdomen is soft. There is no mass.     Tenderness: There is no abdominal tenderness. There is no guarding or rebound.     Hernia: No hernia is present.  Skin:    General: Skin is warm and dry.  Neurological:     Mental Status: She is alert and oriented to person, place, and time.   Labs reviewed, ultrasound report reviewed Assessment  Biliary colic secondary to biliary sludge, transaminitis Plan  We will get MRCP prior to laparoscopic cholecystectomy.  The risks and benefits of the procedure including bleeding, infection, hepatobiliary injury, and the possibility of an open procedure were fully explained to the patient, who gave informed consent.  Patient will return to go over the MRCP results.

## 2021-10-16 NOTE — Patient Instructions (Signed)
Your procedure is scheduled on: 10/21/2021  Report to [facility] Main Entrance at  8:00   AM.  Call this number if you have problems the morning of surgery: [phone]   Remember:   Do not Eat or Drink after midnight         No Smoking the morning of surgery  :  Take these medicines the morning of surgery with A SIP OF WATER: Pantoprazole             (Gabapentin, and zofran if needed)   Do not wear jewelry, make-up or nail polish.  Do not wear lotions, powders, or perfumes. You may wear deodorant.  Do not shave 48 hours prior to surgery. Men may shave face and neck.  Do not bring valuables to the hospital.  Contacts, dentures or bridgework may not be worn into surgery.  Leave suitcase in the car. After surgery it may be brought to your room.  For patients admitted to the hospital, checkout time is 11:00 AM the day of discharge.   Patients discharged the day of surgery will not be allowed to drive home.    Special Instructions: Shower using CHG night before surgery and shower the day of surgery use CHG.  Use special wash - you have one bottle of CHG for all showers.  You should use approximately 1/2 of the bottle for each shower.  How to Use Chlorhexidine for Bathing Chlorhexidine gluconate (CHG) is a germ-killing (antiseptic) solution that is used to clean the skin. It can get rid of the bacteria that normally live on the skin and can keep them away for about 24 hours. To clean your skin with CHG, you may be given: A CHG solution to use in the shower or as part of a sponge bath. A prepackaged cloth that contains CHG. Cleaning your skin with CHG may help lower the risk for infection: While you are staying in the intensive care unit of the hospital. If you have a vascular access, such as a central line, to provide short-term or long-term access to your veins. If you have a catheter to drain urine from your bladder. If you are on a ventilator. A ventilator is a machine that helps  you breathe by moving air in and out of your lungs. After surgery. What are the risks? Risks of using CHG include: A skin reaction. Hearing loss, if CHG gets in your ears and you have a perforated eardrum. Eye injury, if CHG gets in your eyes and is not rinsed out. The CHG product catching fire. Make sure that you avoid smoking and flames after applying CHG to your skin. Do not use CHG: If you have a chlorhexidine allergy or have previously reacted to chlorhexidine. On babies younger than 80 months of age. How to use CHG solution Use CHG only as told by your health care provider, and follow the instructions on the label. Use the full amount of CHG as directed. Usually, this is one bottle. During a shower Follow these steps when using CHG solution during a shower (unless your health care provider gives you different instructions): Start the shower. Use your normal soap and shampoo to wash your face and hair. Turn off the shower or move out of the shower stream. Pour the CHG onto a clean washcloth. Do not use any type of brush or rough-edged sponge. Starting at your neck, lather your body down to your toes. Make sure you follow these instructions: If you will be having  surgery, pay special attention to the part of your body where you will be having surgery. Scrub this area for at least 1 minute. Do not use CHG on your head or face. If the solution gets into your ears or eyes, rinse them well with water. Avoid your genital area. Avoid any areas of skin that have broken skin, cuts, or scrapes. Scrub your back and under your arms. Make sure to wash skin folds. Let the lather sit on your skin for 1-2 minutes or as long as told by your health care provider. Thoroughly rinse your entire body in the shower. Make sure that all body creases and crevices are rinsed well. Dry off with a clean towel. Do not put any substances on your body afterward--such as powder, lotion, or perfume--unless you are  told to do so by your health care provider. Only use lotions that are recommended by the manufacturer. Put on clean clothes or pajamas. If it is the night before your surgery, sleep in clean sheets.  During a sponge bath Follow these steps when using CHG solution during a sponge bath (unless your health care provider gives you different instructions): Use your normal soap and shampoo to wash your face and hair. Pour the CHG onto a clean washcloth. Starting at your neck, lather your body down to your toes. Make sure you follow these instructions: If you will be having surgery, pay special attention to the part of your body where you will be having surgery. Scrub this area for at least 1 minute. Do not use CHG on your head or face. If the solution gets into your ears or eyes, rinse them well with water. Avoid your genital area. Avoid any areas of skin that have broken skin, cuts, or scrapes. Scrub your back and under your arms. Make sure to wash skin folds. Let the lather sit on your skin for 1-2 minutes or as long as told by your health care provider. Using a different clean, wet washcloth, thoroughly rinse your entire body. Make sure that all body creases and crevices are rinsed well. Dry off with a clean towel. Do not put any substances on your body afterward--such as powder, lotion, or perfume--unless you are told to do so by your health care provider. Only use lotions that are recommended by the manufacturer. Put on clean clothes or pajamas. If it is the night before your surgery, sleep in clean sheets. How to use CHG prepackaged cloths Only use CHG cloths as told by your health care provider, and follow the instructions on the label. Use the CHG cloth on clean, dry skin. Do not use the CHG cloth on your head or face unless your health care provider tells you to. When washing with the CHG cloth: Avoid your genital area. Avoid any areas of skin that have broken skin, cuts, or  scrapes. Before surgery Follow these steps when using a CHG cloth to clean before surgery (unless your health care provider gives you different instructions): Using the CHG cloth, vigorously scrub the part of your body where you will be having surgery. Scrub using a back-and-forth motion for 3 minutes. The area on your body should be completely wet with CHG when you are done scrubbing. Do not rinse. Discard the cloth and let the area air-dry. Do not put any substances on the area afterward, such as powder, lotion, or perfume. Put on clean clothes or pajamas. If it is the night before your surgery, sleep in clean sheets.  For  general bathing Follow these steps when using CHG cloths for general bathing (unless your health care provider gives you different instructions). Use a separate CHG cloth for each area of your body. Make sure you wash between any folds of skin and between your fingers and toes. Wash your body in the following order, switching to a new cloth after each step: The front of your neck, shoulders, and chest. Both of your arms, under your arms, and your hands. Your stomach and groin area, avoiding the genitals. Your right leg and foot. Your left leg and foot. The back of your neck, your back, and your buttocks. Do not rinse. Discard the cloth and let the area air-dry. Do not put any substances on your body afterward--such as powder, lotion, or perfume--unless you are told to do so by your health care provider. Only use lotions that are recommended by the manufacturer. Put on clean clothes or pajamas. Contact a health care provider if: Your skin gets irritated after scrubbing. You have questions about using your solution or cloth. You swallow any chlorhexidine. Call your local poison control center ([phone] in the [place] Get help right away if: Your eyes itch badly, or they become very red or swollen. Your skin itches badly and is red or swollen. Your hearing  changes. You have trouble seeing. You have swelling or tingling in your mouth or throat. You have trouble breathing. These symptoms may represent a serious problem that is an emergency. Do not wait to see if the symptoms will go away. Get medical help right away. Call your local emergency services (911 in the [place] Do not drive yourself to the hospital. Summary Chlorhexidine gluconate (CHG) is a germ-killing (antiseptic) solution that is used to clean the skin. Cleaning your skin with CHG may help to lower your risk for infection. You may be given CHG to use for bathing. It may be in a bottle or in a prepackaged cloth to use on your skin. Carefully follow your health care provider's instructions and the instructions on the product label. Do not use CHG if you have a chlorhexidine allergy. Contact your health care provider if your skin gets irritated after scrubbing. This information is not intended to replace advice given to you by your health care provider. Make sure you discuss any questions you have with your health care provider. Document Revised: 02/03/2021 Document Reviewed: 02/03/2021 Elsevier Patient Education  2022 [employer] Minimally Invasive Cholecystectomy, Care After The following information offers guidance on how to care for yourself after your procedure. Your health care provider may also give you more specific instructions. If you have problems or questions, contact your health care provider. What can I expect after the procedure? After the procedure, it is common to have: Pain at your incision sites. You will be given medicines to control this pain. Mild nausea or vomiting. Bloating and possible shoulder pain from the gas that was used during the procedure. Follow these instructions at home: Medicines Take over-the-counter and prescription medicines only as told by your health care provider. If you were prescribed an antibiotic medicine, take it as told by your health  care provider. Do not stop using the antibiotic even if you start to feel better. Ask your health care provider if the medicine prescribed to you: Requires you to avoid driving or using machinery. Can cause constipation. You may need to take these actions to prevent or treat constipation: Drink enough fluid to keep your urine pale yellow. Take over-the-counter or prescription  medicines. Eat foods that are high in fiber, such as beans, whole grains, and fresh fruits and vegetables. Limit foods that are high in fat and processed sugars, such as fried or sweet foods. Incision care  Follow instructions from your health care provider about how to take care of your incisions. Make sure you: Wash your hands with soap and water for at least 20 seconds before and after you change your bandage (dressing). If soap and water are not available, use hand sanitizer. Change your dressing as told by your health care provider. Leave stitches (sutures), skin glue, or adhesive strips in place. These skin closures may need to be in place for 2 weeks or longer. If adhesive strip edges start to loosen and curl up, you may trim the loose edges. Do not remove adhesive strips completely unless your health care provider tells you to do that. Do not take baths, swim, or use a hot tub until your health care provider approves. Ask your health care provider if you may take showers. You may only be allowed to take sponge baths. Check your incision area every day for signs of infection. Check for: More redness, swelling, or pain. Fluid or blood. Warmth. Pus or a bad smell. Activity Rest as told by your health care provider. Do not do activities that require a lot of effort. Avoid sitting for a long time without moving. Get up to take short walks every 1-2 hours. This is important to improve blood flow and breathing. Ask for help if you feel weak or unsteady. Do not lift anything that is heavier than 10 lb (4.5 kg), or the  limit that you are told, until your health care provider says that it is safe. Do not play contact sports until your health care provider approves. Do not return to work or school until your health care provider approves. Return to your normal activities as told by your health care provider. Ask your health care provider what activities are safe for you. General instructions If you were given a sedative during the procedure, it can affect you for several hours. Do not drive or operate machinery until your health care provider says that it is safe. Keep all follow-up visits. This is important. Contact a health care provider if: You develop a rash. You have more redness, swelling, or pain around your incisions. You have fluid or blood coming from your incisions. Your incisions feel warm to the touch. You have pus or a bad smell coming from your incisions. You have a fever. One or more of your incisions breaks open. Get help right away if: You have trouble breathing. You have chest pain. You have more pain in your shoulders. You faint or feel dizzy when you stand. You have severe pain in your abdomen. You have nausea or vomiting that lasts for more than one day. You have leg pain that is new or unusual, or if it is localized to one specific spot. These symptoms may represent a serious problem that is an emergency. Do not wait to see if the symptoms will go away. Get medical help right away. Call your local emergency services (911 in the [place] Do not drive yourself to the hospital. Summary After your procedure, it is common to have pain at the incision sites. You may also have nausea or bloating. Follow your health care provider's instructions about medicine, activity restrictions, and caring for your incision areas. Do not do activities that require a lot of effort. Contact a  health care provider if you have a fever or other signs of infection, such as more redness, swelling, or pain around  the incisions. Get help right away if you have chest pain, increasing pain in the shoulders, or trouble breathing. This information is not intended to replace advice given to you by your health care provider. Make sure you discuss any questions you have with your health care provider. Document Revised: 05/27/2021 Document Reviewed: 05/27/2021 Elsevier Patient Education  2022 [employer] General Anesthesia, Adult, Care After This sheet gives you information about how to care for yourself after your procedure. Your health care provider may also give you more specific instructions. If you have problems or questions, contact your health care provider. What can I expect after the procedure? After the procedure, the following side effects are common: Pain or discomfort at the IV site. Nausea. Vomiting. Sore throat. Trouble concentrating. Feeling cold or chills. Feeling weak or tired. Sleepiness and fatigue. Soreness and body aches. These side effects can affect parts of the body that were not involved in surgery. Follow these instructions at home: For the time period you were told by your health care provider:  Rest. Do not participate in activities where you could fall or become injured. Do not drive or use machinery. Do not drink alcohol. Do not take sleeping pills or medicines that cause drowsiness. Do not make important decisions or sign legal documents. Do not take care of children on your own. Eating and drinking Follow any instructions from your health care provider about eating or drinking restrictions. When you feel hungry, start by eating small amounts of foods that are soft and easy to digest (bland), such as toast. Gradually return to your regular diet. Drink enough fluid to keep your urine pale yellow. If you vomit, rehydrate by drinking water, juice, or clear broth. General instructions If you have sleep apnea, surgery and certain medicines can increase your risk for  breathing problems. Follow instructions from your health care provider about wearing your sleep device: Anytime you are sleeping, including during daytime naps. While taking prescription pain medicines, sleeping medicines, or medicines that make you drowsy. Have a responsible adult stay with you for the time you are told. It is important to have someone help care for you until you are awake and alert. Return to your normal activities as told by your health care provider. Ask your health care provider what activities are safe for you. Take over-the-counter and prescription medicines only as told by your health care provider. If you smoke, do not smoke without supervision. Keep all follow-up visits as told by your health care provider. This is important. Contact a health care provider if: You have nausea or vomiting that does not get better with medicine. You cannot eat or drink without vomiting. You have pain that does not get better with medicine. You are unable to pass urine. You develop a skin rash. You have a fever. You have redness around your IV site that gets worse. Get help right away if: You have difficulty breathing. You have chest pain. You have blood in your urine or stool, or you vomit blood. Summary After the procedure, it is common to have a sore throat or nausea. It is also common to feel tired. Have a responsible adult stay with you for the time you are told. It is important to have someone help care for you until you are awake and alert. When you feel hungry, start by eating small amounts of  foods that are soft and easy to digest (bland), such as toast. Gradually return to your regular diet. Drink enough fluid to keep your urine pale yellow. Return to your normal activities as told by your health care provider. Ask your health care provider what activities are safe for you. This information is not intended to replace advice given to you by your health care provider. Make  sure you discuss any questions you have with your health care provider. Document Revised: 08/08/2020 Document Reviewed: 03/07/2020 Elsevier Patient Education  2022 [employer].

## 2021-10-20 ENCOUNTER — Encounter (HOSPITAL_COMMUNITY)
Admission: RE | Admit: 2021-10-20 | Discharge: 2021-10-20 | Disposition: A | Discharge status: Home / Self Care | Payer: Medicaid Other | Source: Ambulatory Visit | Attending: General Surgery | Admitting: General Surgery

## 2021-10-20 ENCOUNTER — Inpatient Hospital Stay (HOSPITAL_COMMUNITY)
Admission: EM | Admit: 2021-10-20 | Discharge: 2021-10-23 | DRG: 989 | Disposition: A | Discharge status: Home / Self Care | Payer: Medicaid Other | Attending: General Surgery | Admitting: General Surgery

## 2021-10-20 ENCOUNTER — Other Ambulatory Visit: Payer: Self-pay

## 2021-10-20 ENCOUNTER — Encounter (HOSPITAL_COMMUNITY): Payer: Self-pay | Admitting: *Deleted

## 2021-10-20 VITALS — BP 84/66 | HR 107 | Temp 97.7°F | Resp 18 | Ht 64.0 in | Wt 125.0 lb

## 2021-10-20 DIAGNOSIS — F17210 Nicotine dependence, cigarettes, uncomplicated: Secondary | ICD-10-CM | POA: Diagnosis present

## 2021-10-20 DIAGNOSIS — F17200 Nicotine dependence, unspecified, uncomplicated: Secondary | ICD-10-CM | POA: Diagnosis present

## 2021-10-20 DIAGNOSIS — E876 Hypokalemia: Secondary | ICD-10-CM | POA: Diagnosis not present

## 2021-10-20 DIAGNOSIS — G629 Polyneuropathy, unspecified: Secondary | ICD-10-CM | POA: Diagnosis present

## 2021-10-20 DIAGNOSIS — E860 Dehydration: Secondary | ICD-10-CM | POA: Diagnosis present

## 2021-10-20 DIAGNOSIS — Z20822 Contact with and (suspected) exposure to covid-19: Secondary | ICD-10-CM | POA: Diagnosis present

## 2021-10-20 DIAGNOSIS — Z7982 Long term (current) use of aspirin: Secondary | ICD-10-CM

## 2021-10-20 DIAGNOSIS — K828 Other specified diseases of gallbladder: Secondary | ICD-10-CM

## 2021-10-20 DIAGNOSIS — R000 Tachycardia, unspecified: Secondary | ICD-10-CM | POA: Diagnosis not present

## 2021-10-20 DIAGNOSIS — K8020 Calculus of gallbladder without cholecystitis without obstruction: Secondary | ICD-10-CM | POA: Diagnosis present

## 2021-10-20 DIAGNOSIS — Z01812 Encounter for preprocedural laboratory examination: Secondary | ICD-10-CM | POA: Insufficient documentation

## 2021-10-20 DIAGNOSIS — Z79899 Other long term (current) drug therapy: Secondary | ICD-10-CM

## 2021-10-20 DIAGNOSIS — E46 Unspecified protein-calorie malnutrition: Secondary | ICD-10-CM | POA: Diagnosis present

## 2021-10-20 DIAGNOSIS — G8929 Other chronic pain: Secondary | ICD-10-CM | POA: Diagnosis present

## 2021-10-20 DIAGNOSIS — Z01818 Encounter for other preprocedural examination: Secondary | ICD-10-CM

## 2021-10-20 DIAGNOSIS — Z28310 Unvaccinated for covid-19: Secondary | ICD-10-CM

## 2021-10-20 DIAGNOSIS — I959 Hypotension, unspecified: Secondary | ICD-10-CM

## 2021-10-20 DIAGNOSIS — K219 Gastro-esophageal reflux disease without esophagitis: Secondary | ICD-10-CM | POA: Diagnosis present

## 2021-10-20 LAB — CBC WITH DIFFERENTIAL/PLATELET
Abs Immature Granulocytes: 0.06 10*3/uL (ref 0.00–0.07)
Abs Immature Granulocytes: 0.05 10*3/uL (ref 0.00–0.07)
Basophils Absolute: 0.1 10*3/uL (ref 0.0–0.1)
Basophils Absolute: 0.1 10*3/uL (ref 0.0–0.1)
Basophils Relative: 1 %
Basophils Relative: 1 %
Eosinophils Absolute: 0.1 10*3/uL (ref 0.0–0.5)
Eosinophils Absolute: 0.2 10*3/uL (ref 0.0–0.5)
Eosinophils Relative: 1 %
Eosinophils Relative: 2 %
HCT: 43.7 % (ref 36.0–46.0)
HCT: 39.4 % (ref 36.0–46.0)
Hemoglobin: 15.5 g/dL — ABNORMAL HIGH (ref 12.0–15.0)
Hemoglobin: 13.9 g/dL (ref 12.0–15.0)
Immature Granulocytes: 1 %
Immature Granulocytes: 1 %
Lymphocytes Relative: 36 %
Lymphocytes Relative: 38 %
Lymphs Abs: 4.3 10*3/uL — ABNORMAL HIGH (ref 0.7–4.0)
Lymphs Abs: 4 10*3/uL (ref 0.7–4.0)
MCH: 35.6 pg — ABNORMAL HIGH (ref 26.0–34.0)
MCH: 34.7 pg — ABNORMAL HIGH (ref 26.0–34.0)
MCHC: 35.5 g/dL (ref 30.0–36.0)
MCHC: 35.3 g/dL (ref 30.0–36.0)
MCV: 100.2 fL — ABNORMAL HIGH (ref 80.0–100.0)
MCV: 98.3 fL (ref 80.0–100.0)
Monocytes Absolute: 0.8 10*3/uL (ref 0.1–1.0)
Monocytes Absolute: 0.8 10*3/uL (ref 0.1–1.0)
Monocytes Relative: 7 %
Monocytes Relative: 8 %
Neutro Abs: 6.6 10*3/uL (ref 1.7–7.7)
Neutro Abs: 5.3 10*3/uL (ref 1.7–7.7)
Neutrophils Relative %: 54 %
Neutrophils Relative %: 50 %
Platelets: 281 10*3/uL (ref 150–400)
Platelets: 293 10*3/uL (ref 150–400)
RBC: 4.36 MIL/uL (ref 3.87–5.11)
RBC: 4.01 MIL/uL (ref 3.87–5.11)
RDW: 13.8 % (ref 11.5–15.5)
RDW: 13.6 % (ref 11.5–15.5)
WBC: 12.1 10*3/uL — ABNORMAL HIGH (ref 4.0–10.5)
WBC: 10.4 10*3/uL (ref 4.0–10.5)
nRBC: 0 % (ref 0.0–0.2)
nRBC: 0 % (ref 0.0–0.2)

## 2021-10-20 LAB — COMPREHENSIVE METABOLIC PANEL
ALT: 16 U/L (ref 0–44)
AST: 40 U/L (ref 15–41)
Albumin: 2.8 g/dL — ABNORMAL LOW (ref 3.5–5.0)
Alkaline Phosphatase: 114 U/L (ref 38–126)
Anion gap: 15 (ref 5–15)
BUN: 6 mg/dL (ref 6–20)
CO2: 37 mmol/L — ABNORMAL HIGH (ref 22–32)
Calcium: 8.3 mg/dL — ABNORMAL LOW (ref 8.9–10.3)
Chloride: 84 mmol/L — ABNORMAL LOW (ref 98–111)
Creatinine, Ser: 0.41 mg/dL — ABNORMAL LOW (ref 0.44–1.00)
GFR, Estimated: >60 mL/min (ref >60)
Glucose, Bld: 102 mg/dL — ABNORMAL HIGH (ref 70–99)
Potassium: <2 mmol/L — CL (ref 3.5–5.1)
Sodium: 136 mmol/L (ref 135–145)
Total Bilirubin: 1.3 mg/dL — ABNORMAL HIGH (ref 0.3–1.2)
Total Protein: 6.2 g/dL — ABNORMAL LOW (ref 6.5–8.1)

## 2021-10-20 LAB — BASIC METABOLIC PANEL
Anion gap: 11 (ref 5–15)
BUN: 6 mg/dL (ref 6–20)
CO2: 40 mmol/L — ABNORMAL HIGH (ref 22–32)
Calcium: 8.2 mg/dL — ABNORMAL LOW (ref 8.9–10.3)
Chloride: 83 mmol/L — ABNORMAL LOW (ref 98–111)
Creatinine, Ser: 0.48 mg/dL (ref 0.44–1.00)
GFR, Estimated: >60 mL/min (ref >60)
Glucose, Bld: 115 mg/dL — ABNORMAL HIGH (ref 70–99)
Potassium: <2 mmol/L — CL (ref 3.5–5.1)
Sodium: 134 mmol/L — ABNORMAL LOW (ref 135–145)

## 2021-10-20 LAB — MAGNESIUM: Magnesium: 1.7 mg/dL (ref 1.7–2.4)

## 2021-10-20 LAB — HCG, SERUM, QUALITATIVE
Preg, Serum: POSITIVE — AB
Preg, Serum: POSITIVE — AB

## 2021-10-20 LAB — HCG, QUANTITATIVE, PREGNANCY: hCG, Beta Chain, Quant, S: 14 m[IU]/mL — ABNORMAL HIGH (ref <5)

## 2021-10-20 MED ORDER — POTASSIUM CHLORIDE 10 MEQ/100ML IV SOLN
10.0000 meq | INTRAVENOUS | Status: AC
  Administered 2021-10-20 – 2021-10-21 (×3): 10 meq via INTRAVENOUS
  Filled 2021-10-20 (×3): qty 100

## 2021-10-20 MED ORDER — POTASSIUM CHLORIDE CRYS ER 20 MEQ PO TBCR
40.0000 meq | EXTENDED_RELEASE_TABLET | Freq: Once | ORAL | Status: AC
  Administered 2021-10-20: 30 meq via ORAL
  Filled 2021-10-20: qty 2

## 2021-10-20 MED ORDER — MORPHINE SULFATE (PF) 4 MG/ML IV SOLN
4.0000 mg | Freq: Once | INTRAVENOUS | Status: AC
  Administered 2021-10-20: 4 mg via INTRAVENOUS
  Filled 2021-10-20: qty 1

## 2021-10-20 MED ORDER — LACTATED RINGERS IV BOLUS
1000.0000 mL | Freq: Once | INTRAVENOUS | Status: AC
  Administered 2021-10-20: 1000 mL via INTRAVENOUS

## 2021-10-20 MED ORDER — ONDANSETRON HCL 4 MG/2ML IJ SOLN
INTRAMUSCULAR | Status: AC
  Administered 2021-10-20: 4 mg
  Filled 2021-10-20: qty 2

## 2021-10-20 NOTE — Progress Notes (Signed)
Notified by Lab.Marland KitchenMarland KitchenCritical Potassium of 1.7.  Dr Lovell Sheehan notified and patient will be admitted to the hospital later today.

## 2021-10-20 NOTE — ED Triage Notes (Signed)
Pt had her preop for surgery today and was called by Dr. Lovell Sheehan and told to come to hospital due to low potassium of 2

## 2021-10-20 NOTE — ED Provider Notes (Signed)
[facility] EMERGENCY DEPARTMENT Provider Note   CSN: [id number] Arrival date & time: 10/20/21  1932     History Chief Complaint  Patient presents with   Abnormal Lab    Potassium 2    Pamela [name] is a 40 y.o. female presents to the emergency department with a chief complaint of hypokalemia.  Patient had lab results drawn earlier today prior to cholecystectomy planned for tomorrow.  Patient's potassium was found to be less than 2.  Patient reports that she has had nausea, vomiting, and diarrhea since April.  The symptoms have been worse over the last few days.  Reports that she has been vomiting every 30 to 40 minutes over this time.  Patient describes emesis as bilious.  Denies any hematemesis or coffee-ground emesis.  Patient reports frequent diarrhea over the last few days.  Patient reports decreased appetite over this time as well..    Additionally patient complains of abdominal pain to right upper quadrant.  Patient rates pain 7/10 on the pain scale.  Pain is worse with touch, movement, and eating.  Patient reports no alleviation with prescribed pain medication.    Abnormal Lab     Past Medical History:  Diagnosis Date   Abnormal vaginal Pap smear    ASCUS of cervix with negative high risk HPV 09/01/2021   Repeat papin 3 years, per [facility], 5 year risk for CIN 3+ is 0.40%   Depression    Elevated liver enzymes 08/01/2021   Stop alcohol, recheck at appt, get [place] 9/1   Migraines    Neuropathy    Pregnant 06/19/2015   Sludge in gallbladder 09/01/2021   Refer to Dr Arnoldo Morale   Vaginal Pap smear, abnormal     Patient Active Problem List   Diagnosis Date Noted   Sludge in gallbladder 09/01/2021   ASCUS of cervix with negative high risk HPV 09/01/2021   Routine general medical examination at a health care facility 08/20/2021   Encounter for gynecological examination with Papanicolaou smear of cervix 08/20/2021   Menopausal state 08/20/2021   Screening mammogram for breast  cancer 08/20/2021   Encounter for screening fecal occult blood testing 08/20/2021   Elevated liver enzymes 08/01/2021   RUQ pain 07/30/2021   Epigastric pain 07/30/2021   Irregular periods 07/30/2021   Hot flashes 07/30/2021   Anxiety and depression 07/30/2021   Gastroesophageal reflux disease without esophagitis 07/04/2021   Acute left-sided low back pain with left-sided sciatica 06/27/2020   Subclinical hyperthyroidism 04/01/2016   Status post repeat low transverse cesarean section 02/11/2016   Trichomonas infection 12/23/2015   Chronic prescription opiate use 11/19/2015   Alcohol use affecting pregnancy, antepartum 11/19/2015   Marijuana use 11/19/2015   Chronic low back pain 10/22/2015   Depression 08/06/2015   Sciatica 08/06/2015   Susceptible to varicella (non-immune), currently pregnant 07/17/2015   Supervision of other high-risk pregnancy 07/16/2015   AMA (advanced maternal age) multigravida 35+ 07/16/2015   Previous cesarean delivery, antepartum 07/16/2015   Lumbar herniated disc 07/16/2015   Smoker 07/16/2015   Rotator cuff syndrome of left shoulder 07/30/2011   Shoulder subluxation, left 07/30/2011    Past Surgical History:  Procedure Laterality Date   BACK SURGERY     CESAREAN SECTION  x 2   CESAREAN SECTION WITH BILATERAL TUBAL LIGATION Bilateral 02/11/2016   Procedure: REPEAT CESAREAN SECTION WITH BILATERAL TUBAL LIGATION;  Surgeon: Jonnie Kind, MD;  Location: [facility];  Service: Obstetrics;  Laterality: Bilateral;   TONSILLECTOMY AND ADENOIDECTOMY  OB History     Gravida  8   Para  5   Term  5   Preterm      AB  3   Living  5      SAB      IAB  3   Ectopic      Multiple  0   Live Births  5           Family History  Problem Relation Age of Onset   Dementia Paternal Grandfather    Dementia Paternal Grandmother     Social History   Tobacco Use   Smoking status: Every Day    Packs/day: 1.00    Years: 19.00    Pack years:  19.00    Types: Cigarettes    Start date: 08/02/2007   Smokeless tobacco: Never   Tobacco comments:    smokes 6 cig per day  Vaping Use   Vaping Use: Never used  Substance Use Topics   Alcohol use: Yes    Comment: occ   Drug use: No    Types: Marijuana    Home Medications Prior to Admission medications   Medication Sig Start Date End Date Taking? Authorizing Provider  aspirin EC 81 MG tablet Take 81 mg by mouth daily. Swallow whole.    [provider]  gabapentin (NEURONTIN) 100 MG capsule Take 100 mg by mouth 3 (three) times daily.    [provider]  ondansetron (ZOFRAN ODT) 8 MG disintegrating tablet Take 1 tablet (8 mg total) by mouth every 8 (eight) hours as needed for nausea or vomiting. 07/03/21   Campbell Riches, NP  pantoprazole (PROTONIX) 40 MG tablet Take 1 tablet (40 mg total) by mouth daily. For acid reflux 07/03/21   Campbell Riches, NP  SUBOXONE 8-2 MG FILM SMARTSIG:0.25 Strip(s) Sublingual 4 Times Daily 07/22/21   [provider]    Allergies    Zomig [zolmitriptan]  Review of Systems   Review of Systems  Constitutional:  Negative for chills and fever.  Eyes:  Negative for visual disturbance.  Respiratory:  Negative for shortness of breath.   Cardiovascular:  Negative for chest pain.  Gastrointestinal:  Positive for abdominal pain, diarrhea, nausea and vomiting. Negative for abdominal distention, anal bleeding, blood in stool, constipation and rectal pain.  Genitourinary:  Negative for difficulty urinating, dysuria, frequency, urgency, vaginal bleeding, vaginal discharge and vaginal pain.  Musculoskeletal:  Negative for back pain and neck pain.  Skin:  Negative for color change and rash.  Neurological:  Negative for dizziness, syncope, light-headedness and headaches.  Psychiatric/Behavioral:  Negative for confusion.    Physical Exam Updated Vital Signs BP 106/69   Pulse (!) 103   Temp (!) 97.5 F (36.4 C) (Oral)   Resp 20    Ht 5\' 4"  (1.626 m)   Wt 56.7 kg   SpO2 96%   BMI 21.46 kg/m   Physical Exam Vitals and nursing note reviewed.  Constitutional:      General: She is not in acute distress.    Appearance: She is ill-appearing. She is not toxic-appearing or diaphoretic.     Comments: Appears uncomfortable due to complaints of pain  HENT:     Head: Normocephalic.  Eyes:     General: No scleral icterus.       Right eye: No discharge.        Left eye: No discharge.  Cardiovascular:     Rate and Rhythm: Normal rate.  Pulmonary:  Effort: Pulmonary effort is normal.  Abdominal:     General: Abdomen is flat. There is no distension. There are no signs of injury.     Palpations: Abdomen is soft. There is no mass or pulsatile mass.     Tenderness: There is abdominal tenderness in the right upper quadrant. There is no guarding or rebound.     Hernia: There is no hernia in the umbilical area or ventral area.  Skin:    General: Skin is warm and dry.  Neurological:     General: No focal deficit present.     Mental Status: She is alert.     GCS: GCS eye subscore is 4. GCS verbal subscore is 5. GCS motor subscore is 6.  Psychiatric:        Behavior: Behavior is cooperative.    ED Results / Procedures / Treatments   Labs (all labs ordered are listed, but only abnormal results are displayed) Labs Reviewed  CBC WITH DIFFERENTIAL/PLATELET - Abnormal; Notable for the following components:      Result Value   MCH 34.7 (*)    All other components within normal limits  RESP PANEL BY RT-PCR (FLU A&B, COVID) ARPGX2  BASIC METABOLIC PANEL  MAGNESIUM  HCG, SERUM, QUALITATIVE    EKG None  Radiology No results found.  Procedures .Critical Care Performed by: Loni Beckwith, PA-C Authorized by: Loni Beckwith, PA-C   Critical care provider statement:    Critical care time (minutes):  30   Critical care was necessary to treat or prevent imminent or life-threatening deterioration of the  following conditions:  Metabolic crisis   Critical care was time spent personally by me on the following activities:  Development of treatment plan with patient or surrogate, evaluation of patient's response to treatment, examination of patient, ordering and review of laboratory studies, ordering and review of radiographic studies, ordering and performing treatments and interventions, pulse oximetry, re-evaluation of patient's condition and review of old charts   Medications Ordered in ED Medications  potassium chloride SA (KLOR-CON) CR tablet 40 mEq (has no administration in time range)  potassium chloride 10 mEq in 100 mL IVPB (has no administration in time range)  morphine 4 MG/ML injection 4 mg (4 mg Intravenous Given 10/20/21 2231)  lactated ringers bolus 1,000 mL (1,000 mLs Intravenous New Bag/Given 10/20/21 2231)  ondansetron (ZOFRAN) 4 MG/2ML injection (4 mg  Given 10/20/21 2231)    ED Course  I have reviewed the triage vital signs and the nursing notes.  Pertinent labs & imaging results that were available during my care of the patient were reviewed by me and considered in my medical decision making (see chart for details).    MDM Rules/Calculators/A&P                           Alert 40 year old female no acute stress, nontoxic-appearing.  Patient appears ill and uncomfortable due to complaints of pain.  Presents to the emergency department with a chief complaint of hypokalemia.  Prior to patient's arrival attending physician Dr. Laverta Baltimore was contacted by Dr. Arnoldo Morale with surgery who advised patient had lab work earlier today which showed hypokalemia.  Patient is scheduled to have cholecystectomy tomorrow.  If potassium is repleted to 3 or greater will be able to go undergo surgery.  Also reports that patient had patient tested earlier today which was slightly elevated however low suspicion for pregnancy as patient has tubal ligation.  Patient  reports nausea, vomiting, and diarrhea over  the last few days.  Suspect this as patient's cause of hypokalemia.  We will replete with IV potassium and oral potassium as tolerated.  We will give patient morphine and Zofran for her vomiting and pain.  Plan to obtain quantitative beta-hCG to evaluate for possible pregnancy.  Plan to admit to hospitalist service after lab work is obtained.    Patient care transferred to Dr. Dina Rich at the end of my shift. Patient presentation, ED course, and plan of care discussed with review of all pertinent labs and imaging. Please see his/her note for further details regarding further ED course and disposition.   Final Clinical Impression(s) / ED Diagnoses Final diagnoses:  None    Rx / DC Orders ED Discharge Orders     None        Loni Beckwith, PA-C 10/20/21 2304    Merryl Hacker, MD 10/21/21 [id number]

## 2021-10-21 ENCOUNTER — Encounter (HOSPITAL_COMMUNITY): Payer: Self-pay | Admitting: Family Medicine

## 2021-10-21 ENCOUNTER — Ambulatory Visit (HOSPITAL_COMMUNITY): Admission: RE | Admit: 2021-10-21 | Payer: Medicaid Other | Source: Home / Self Care | Admitting: General Surgery

## 2021-10-21 DIAGNOSIS — E876 Hypokalemia: Secondary | ICD-10-CM | POA: Diagnosis not present

## 2021-10-21 DIAGNOSIS — F17200 Nicotine dependence, unspecified, uncomplicated: Secondary | ICD-10-CM | POA: Diagnosis not present

## 2021-10-21 DIAGNOSIS — I952 Hypotension due to drugs: Secondary | ICD-10-CM

## 2021-10-21 DIAGNOSIS — K828 Other specified diseases of gallbladder: Secondary | ICD-10-CM

## 2021-10-21 DIAGNOSIS — Z20822 Contact with and (suspected) exposure to covid-19: Secondary | ICD-10-CM | POA: Diagnosis present

## 2021-10-21 DIAGNOSIS — G629 Polyneuropathy, unspecified: Secondary | ICD-10-CM | POA: Diagnosis present

## 2021-10-21 DIAGNOSIS — N926 Irregular menstruation, unspecified: Secondary | ICD-10-CM

## 2021-10-21 DIAGNOSIS — F17210 Nicotine dependence, cigarettes, uncomplicated: Secondary | ICD-10-CM | POA: Diagnosis present

## 2021-10-21 DIAGNOSIS — R599 Enlarged lymph nodes, unspecified: Secondary | ICD-10-CM | POA: Diagnosis not present

## 2021-10-21 DIAGNOSIS — F418 Other specified anxiety disorders: Secondary | ICD-10-CM | POA: Diagnosis not present

## 2021-10-21 DIAGNOSIS — Z7982 Long term (current) use of aspirin: Secondary | ICD-10-CM | POA: Diagnosis not present

## 2021-10-21 DIAGNOSIS — K8010 Calculus of gallbladder with chronic cholecystitis without obstruction: Secondary | ICD-10-CM | POA: Diagnosis not present

## 2021-10-21 DIAGNOSIS — Z28310 Unvaccinated for covid-19: Secondary | ICD-10-CM | POA: Diagnosis not present

## 2021-10-21 DIAGNOSIS — Z79899 Other long term (current) drug therapy: Secondary | ICD-10-CM | POA: Diagnosis not present

## 2021-10-21 DIAGNOSIS — R000 Tachycardia, unspecified: Secondary | ICD-10-CM | POA: Diagnosis not present

## 2021-10-21 DIAGNOSIS — K8020 Calculus of gallbladder without cholecystitis without obstruction: Secondary | ICD-10-CM | POA: Diagnosis not present

## 2021-10-21 DIAGNOSIS — E46 Unspecified protein-calorie malnutrition: Secondary | ICD-10-CM | POA: Diagnosis present

## 2021-10-21 DIAGNOSIS — K219 Gastro-esophageal reflux disease without esophagitis: Secondary | ICD-10-CM | POA: Diagnosis present

## 2021-10-21 DIAGNOSIS — I959 Hypotension, unspecified: Secondary | ICD-10-CM

## 2021-10-21 DIAGNOSIS — E860 Dehydration: Secondary | ICD-10-CM | POA: Diagnosis present

## 2021-10-21 DIAGNOSIS — G8929 Other chronic pain: Secondary | ICD-10-CM | POA: Diagnosis present

## 2021-10-21 DIAGNOSIS — E440 Moderate protein-calorie malnutrition: Secondary | ICD-10-CM

## 2021-10-21 LAB — COMPREHENSIVE METABOLIC PANEL
ALT: 14 U/L (ref 0–44)
AST: 37 U/L (ref 15–41)
Albumin: 2.1 g/dL — ABNORMAL LOW (ref 3.5–5.0)
Alkaline Phosphatase: 85 U/L (ref 38–126)
Anion gap: 6 (ref 5–15)
BUN: 5 mg/dL — ABNORMAL LOW (ref 6–20)
CO2: 40 mmol/L — ABNORMAL HIGH (ref 22–32)
Calcium: 7.7 mg/dL — ABNORMAL LOW (ref 8.9–10.3)
Chloride: 89 mmol/L — ABNORMAL LOW (ref 98–111)
Creatinine, Ser: 0.49 mg/dL (ref 0.44–1.00)
GFR, Estimated: >60 mL/min (ref >60)
Glucose, Bld: 98 mg/dL (ref 70–99)
Potassium: 2.1 mmol/L — CL (ref 3.5–5.1)
Sodium: 135 mmol/L (ref 135–145)
Total Bilirubin: 1.1 mg/dL (ref 0.3–1.2)
Total Protein: 4.6 g/dL — ABNORMAL LOW (ref 6.5–8.1)

## 2021-10-21 LAB — MAGNESIUM
Magnesium: 1.6 mg/dL — ABNORMAL LOW (ref 1.7–2.4)
Magnesium: 2 mg/dL (ref 1.7–2.4)

## 2021-10-21 LAB — HIV ANTIBODY (ROUTINE TESTING W REFLEX): HIV Screen 4th Generation wRfx: NONREACTIVE

## 2021-10-21 LAB — CBC WITH DIFFERENTIAL/PLATELET
Abs Immature Granulocytes: 0.1 10*3/uL — ABNORMAL HIGH (ref 0.00–0.07)
Basophils Absolute: 0.1 10*3/uL (ref 0.0–0.1)
Basophils Relative: 1 %
Eosinophils Absolute: 0.2 10*3/uL (ref 0.0–0.5)
Eosinophils Relative: 2 %
HCT: 34.1 % — ABNORMAL LOW (ref 36.0–46.0)
Hemoglobin: 12.2 g/dL (ref 12.0–15.0)
Immature Granulocytes: 1 %
Lymphocytes Relative: 41 %
Lymphs Abs: 4.2 10*3/uL — ABNORMAL HIGH (ref 0.7–4.0)
MCH: 35.8 pg — ABNORMAL HIGH (ref 26.0–34.0)
MCHC: 35.8 g/dL (ref 30.0–36.0)
MCV: 100 fL (ref 80.0–100.0)
Monocytes Absolute: 0.8 10*3/uL (ref 0.1–1.0)
Monocytes Relative: 8 %
Neutro Abs: 4.8 10*3/uL (ref 1.7–7.7)
Neutrophils Relative %: 47 %
Platelets: 236 10*3/uL (ref 150–400)
RBC: 3.41 MIL/uL — ABNORMAL LOW (ref 3.87–5.11)
RDW: 13.7 % (ref 11.5–15.5)
WBC: 10.3 10*3/uL (ref 4.0–10.5)
nRBC: 0 % (ref 0.0–0.2)

## 2021-10-21 LAB — POTASSIUM: Potassium: 3 mmol/L — ABNORMAL LOW (ref 3.5–5.1)

## 2021-10-21 LAB — RESP PANEL BY RT-PCR (FLU A&B, COVID) ARPGX2
Influenza A by PCR: NEGATIVE
Influenza B by PCR: NEGATIVE
SARS Coronavirus 2 by RT PCR: NEGATIVE

## 2021-10-21 LAB — HCG, QUANTITATIVE, PREGNANCY: hCG, Beta Chain, Quant, S: 13 m[IU]/mL — ABNORMAL HIGH (ref <5)

## 2021-10-21 LAB — TSH: TSH: 1.532 u[IU]/mL (ref 0.350–4.500)

## 2021-10-21 LAB — CORTISOL-AM, BLOOD: Cortisol - AM: 6 ug/dL — ABNORMAL LOW (ref 6.7–22.6)

## 2021-10-21 MED ORDER — LACTATED RINGERS IV BOLUS
1000.0000 mL | Freq: Once | INTRAVENOUS | Status: AC
  Administered 2021-10-21: 1000 mL via INTRAVENOUS

## 2021-10-21 MED ORDER — ONDANSETRON HCL 4 MG/2ML IJ SOLN
4.0000 mg | Freq: Four times a day (QID) | INTRAMUSCULAR | Status: DC | PRN
  Filled 2021-10-21: qty 2

## 2021-10-21 MED ORDER — HYDROMORPHONE HCL 1 MG/ML IJ SOLN
1.0000 mg | INTRAMUSCULAR | Status: DC | PRN
  Administered 2021-10-21 – 2021-10-22 (×8): 1 mg via INTRAVENOUS
  Filled 2021-10-21 (×8): qty 1

## 2021-10-21 MED ORDER — GABAPENTIN 100 MG PO CAPS
100.0000 mg | ORAL_CAPSULE | Freq: Three times a day (TID) | ORAL | Status: DC
  Administered 2021-10-21 – 2021-10-23 (×6): 100 mg via ORAL
  Filled 2021-10-21 (×7): qty 1

## 2021-10-21 MED ORDER — ACETAMINOPHEN 325 MG PO TABS: 650.0000 mg | ORAL_TABLET | Freq: Four times a day (QID) | ORAL | Status: DC | PRN

## 2021-10-21 MED ORDER — ACETAMINOPHEN 650 MG RE SUPP: 650.0000 mg | Freq: Four times a day (QID) | RECTAL | Status: DC | PRN

## 2021-10-21 MED ORDER — ASPIRIN EC 81 MG PO TBEC
81.0000 mg | DELAYED_RELEASE_TABLET | Freq: Every day | ORAL | Status: DC
  Filled 2021-10-21: qty 1

## 2021-10-21 MED ORDER — KCL IN DEXTROSE-NACL 40-5-0.45 MEQ/L-%-% IV SOLN: INTRAVENOUS | Status: DC

## 2021-10-21 MED ORDER — POTASSIUM CHLORIDE 10 MEQ/100ML IV SOLN
10.0000 meq | INTRAVENOUS | Status: AC
  Administered 2021-10-21 (×2): 10 meq via INTRAVENOUS
  Filled 2021-10-21 (×3): qty 100

## 2021-10-21 MED ORDER — POTASSIUM CHLORIDE CRYS ER 20 MEQ PO TBCR
40.0000 meq | EXTENDED_RELEASE_TABLET | Freq: Once | ORAL | Status: AC
  Administered 2021-10-21: 40 meq via ORAL
  Filled 2021-10-21: qty 2

## 2021-10-21 MED ORDER — MIDODRINE HCL 5 MG PO TABS
5.0000 mg | ORAL_TABLET | Freq: Three times a day (TID) | ORAL | Status: DC
  Administered 2021-10-21 – 2021-10-23 (×5): 5 mg via ORAL
  Filled 2021-10-21 (×6): qty 1

## 2021-10-21 MED ORDER — PANTOPRAZOLE SODIUM 40 MG PO TBEC
40.0000 mg | DELAYED_RELEASE_TABLET | Freq: Every day | ORAL | Status: DC
  Administered 2021-10-21 – 2021-10-23 (×3): 40 mg via ORAL
  Filled 2021-10-21 (×3): qty 1

## 2021-10-21 MED ORDER — POTASSIUM CHLORIDE 10 MEQ/100ML IV SOLN
10.0000 meq | INTRAVENOUS | Status: AC
  Administered 2021-10-21 (×2): 10 meq via INTRAVENOUS
  Filled 2021-10-21 (×2): qty 100

## 2021-10-21 MED ORDER — SODIUM CHLORIDE 0.9 % IV SOLN: Freq: Once | INTRAVENOUS | Status: AC

## 2021-10-21 MED ORDER — COSYNTROPIN 0.25 MG IJ SOLR
0.2500 mg | Freq: Once | INTRAMUSCULAR | Status: AC
  Administered 2021-10-22: 0.25 mg via INTRAVENOUS
  Filled 2021-10-21 (×2): qty 0.25

## 2021-10-21 MED ORDER — OXYCODONE HCL 5 MG PO TABS
5.0000 mg | ORAL_TABLET | ORAL | Status: DC | PRN
  Administered 2021-10-21 – 2021-10-23 (×11): 5 mg via ORAL
  Filled 2021-10-21 (×10): qty 1

## 2021-10-21 MED ORDER — ALUM & MAG HYDROXIDE-SIMETH 200-200-20 MG/5ML PO SUSP: 30.0000 mL | ORAL | Status: DC | PRN

## 2021-10-21 MED ORDER — SODIUM CHLORIDE 0.9 % IV SOLN
INTRAVENOUS | Status: DC
Start: 2021-10-21 — End: 2021-10-21

## 2021-10-21 MED ORDER — ONDANSETRON HCL 4 MG PO TABS: 4.0000 mg | ORAL_TABLET | Freq: Four times a day (QID) | ORAL | Status: DC | PRN

## 2021-10-21 MED ORDER — POTASSIUM CHLORIDE 10 MEQ/100ML IV SOLN
10.0000 meq | INTRAVENOUS | Status: AC
  Administered 2021-10-21 (×4): 10 meq via INTRAVENOUS
  Filled 2021-10-21 (×2): qty 100

## 2021-10-21 MED ORDER — SODIUM CHLORIDE 0.9 % IV BOLUS
500.0000 mL | Freq: Once | INTRAVENOUS | Status: AC
  Administered 2021-10-21: 500 mL via INTRAVENOUS

## 2021-10-21 MED ORDER — MORPHINE SULFATE (PF) 2 MG/ML IV SOLN: 2.0000 mg | INTRAVENOUS | Status: DC | PRN

## 2021-10-21 NOTE — Progress Notes (Signed)
Patient seen and examined this morning, H&P reviewed and agree with the assessment and plan  40 year old female with history of depression, abnormal Pap, neuropathy, gallbladder sludge, tobacco use comes into the hospital with abnormal labs.  Briefly, she has been having intermittent GI symptoms with abdominal pain, nausea, vomiting, weight loss as well as diarrhea for several months.  She was evaluated by several physician and eventually felt to be due to her gallbladder.  She was scheduled to have cholecystectomy by general surgery however preoperative labs showed a potassium of 2.1 and she was admitted to the hospital.  Acute hypokalemia, hypomagnesemia-probably multifactorial in the setting of poor p.o. intake, weight loss as well as intermittent diarrhea.  Continue to replete aggressively, recheck again this afternoon.  Obtain cortisol and TSH as well.  Gallbladder sludge-plan for laparoscopic cholecystectomy once potassium is above 3.  General surgery following  Hypertension-possibly due to pain medications, dehydration due to decreased p.o. intake.  Placed on midodrine on admission, continue  Abnormal pregnancy test-in office, hCG 14, repeat 13, downtrending.  She has a history of tubal ligation.  Chronic pain-continue gabapentin  Tobacco use-she will need counseling  Scheduled Meds:  aspirin EC  81 mg Oral Daily   gabapentin  100 mg Oral TID   midodrine  5 mg Oral TID WC   pantoprazole  40 mg Oral Daily   Continuous Infusions:  sodium chloride 125 mL/hr at 10/21/21 0558   potassium chloride 10 mEq (10/21/21 0607)   PRN Meds:.acetaminophen **OR** acetaminophen, morphine injection, ondansetron **OR** ondansetron (ZOFRAN) IV, oxyCODONE  [name], MD, PhD [facility]  Between 7 am - 7 pm you can contact me via Amion (for emergencies) or Securechat (non urgent matters).  I am not available 7 pm - 7 am, please contact night coverage MD/APP via Amion

## 2021-10-21 NOTE — Progress Notes (Signed)
Patient underwent preoperative testing yesterday for her intended laparoscopic cholecystectomy.  She was found to have a potassium of 1.7.  Patient was notified she presented to the emergency room yesterday evening to receive potassium supplementation.  Due to the hypokalemia, her surgery has been delayed until tomorrow.  She was also noted to have a positive hCG, but she is status post tubal ligation in the remote past and is currently menopausal.  A qualitative amount was only 14.  Her positive hCG is most likely secondary to her perimenopausal state. Multiple runs of potassium have been ordered.  We will recheck potassium at 4:00 today. Patient has been transferred to the general surgery service.

## 2021-10-21 NOTE — H&P (View-Only) (Signed)
Patient underwent preoperative testing yesterday for her intended laparoscopic cholecystectomy.  She was found to have a potassium of 1.7.  Patient was notified she presented to the emergency room yesterday evening to receive potassium supplementation.  Due to the hypokalemia, her surgery has been delayed until tomorrow.  She was also noted to have a positive hCG, but she is status post tubal ligation in the remote past and is currently menopausal.  A qualitative amount was only 14.  Her positive hCG is most likely secondary to her perimenopausal state. Multiple runs of potassium have been ordered.  We will recheck potassium at 4:00 today. Patient has been transferred to the general surgery service.

## 2021-10-21 NOTE — ED Notes (Signed)
BP 86/57 - Dr. Carren Rang informed.

## 2021-10-21 NOTE — ED Notes (Signed)
Hospitalist notified about pt's low blood pressures.  76/55. Pt has been asleep since handoff.  Bolus ordered by provider

## 2021-10-21 NOTE — H&P (Signed)
TRH H&P    Patient Demographics:    Pamela [name], is a 40 y.o. female  MRN: [id number]  DOB - 1981-03-22  Admit Date - 10/20/2021  Referring MD/NP/PA: Long  Outpatient Primary MD for the patient is Babs Sciara, MD  Patient coming from: Home  Chief complaint- abnormal labs   HPI:    Pamela [name]  is a 40 y.o. female, with history of depression, abnormal Pap, neuropathy, gallbladder sludge, tobacco use disorder, and more presents ED with chief complaint of abnormal labs.  Patient was in the office today for preoperation labs.  She was scheduled for lap chole on 10/21/2021.  Her potassium was found to be less than 2.  General surgery sent her to the ED.  In the ED her potassium again was less than 2.  She had another abnormal lab which was an hCG of 14.  Patient has a history of tubal ligation.  In the ED her hCG was 13, so it is downtrending.  Will likely need further outpatient work-up.  Regarding patient's gallbladder symptoms, patient reports pain since April.  She reports that she saw several physicians that all thought her abdominal pain was grief related.  She had blood work, MRIs, and her OB/GYN finally suggested that it could be cholecystitis.  Patient reports that her symptoms included vomiting all day constant, every day.  Her last normal meal was in April 2022 per her report.  She reports she has not lost any weight, but she can tell when she looks in the mirror that she is wasting.  She reports she has diarrhea to me times a day to count.  Both of these things are worse if she tries to eat.  She has had no hematochezia, hematemesis.  She reports her pain is crampy, sharp, and twisted.  She has never had pancreatitis or liver failure per her report.  She has never noticed jaundice before.  Patient reports that she has not had any muscle cramping.  She does report peripheral neuropathy could be consistent  with hypokalemia symptoms.  She reports like a Raynaud's syndrome in her hand with her hand feeling cold and turning white.  Patient denies any chest pain, palpitations.  She has no other complaints at this time.  Patient is a current smoker but declines nicotine patch.  She reports she smokes 1 pack/day.  She drinks alcohol about once a month.  She is never had alcohol withdrawals.  She does not use illicit drugs.  She is not vaccinated for COVID.  Patient is full code.  In the ED Temp 97.5, heart rate 103-107, respiratory rate 18-20, blood pressure 106/69 Leukocytosis 12.1, hemoglobin 15.5 Potassium less than 2, chloride 84, gap 15 Positive pregnancy test with a quantitative hCG of 13 Right upper quadrant ultrasound was done in September '22 that showed gallbladder sludge, with out frank cholelithiasis or acute cholecystitis. Patient was given 70 mEq of potassium in the ED. General surgery medicine admission    Review of systems:    In addition to the HPI above,  No Fever-chills, No Headache, No changes with Vision or hearing, No problems swallowing food or Liquids, No Chest pain, Cough or Shortness of Breath, No Blood in stool or Urine, No dysuria, No new skin rashes or bruises, No new joints pains-aches,  No new weakness, tingling, numbness in any extremity, No recent weight gain or loss, No polyuria, polydypsia or polyphagia, No significant Mental Stressors.  All other systems reviewed and are negative.    Past History of the following :    Past Medical History:  Diagnosis Date   Abnormal vaginal Pap smear    ASCUS of cervix with negative high risk HPV 09/01/2021   Repeat papin 3 years, per [facility], 5 year risk for CIN 3+ is 0.40%   Depression    Elevated liver enzymes 08/01/2021   Stop alcohol, recheck at appt, get [place] 9/1   Migraines    Neuropathy    Pregnant 06/19/2015   Sludge in gallbladder 09/01/2021   Refer to Dr Lovell Sheehan   Vaginal Pap smear, abnormal        Past Surgical History:  Procedure Laterality Date   BACK SURGERY     CESAREAN SECTION  x 2   CESAREAN SECTION WITH BILATERAL TUBAL LIGATION Bilateral 02/11/2016   Procedure: REPEAT CESAREAN SECTION WITH BILATERAL TUBAL LIGATION;  Surgeon: Tilda Burrow, MD;  Location: [facility];  Service: Obstetrics;  Laterality: Bilateral;   TONSILLECTOMY AND ADENOIDECTOMY        Social History:      Social History   Tobacco Use   Smoking status: Every Day    Packs/day: 1.00    Years: 19.00    Pack years: 19.00    Types: Cigarettes    Start date: 08/02/2007   Smokeless tobacco: Never   Tobacco comments:    smokes 6 cig per day  Substance Use Topics   Alcohol use: Yes    Comment: occ       Family History :     Family History  Problem Relation Age of Onset   Dementia Paternal Grandfather    Dementia Paternal Grandmother       Home Medications:   Prior to Admission medications   Medication Sig Start Date End Date Taking? Authorizing Provider  aspirin EC 81 MG tablet Take 81 mg by mouth daily. Swallow whole.   Yes [provider]  gabapentin (NEURONTIN) 100 MG capsule Take 100 mg by mouth 3 (three) times daily.   Yes [provider]  ondansetron (ZOFRAN ODT) 8 MG disintegrating tablet Take 1 tablet (8 mg total) by mouth every 8 (eight) hours as needed for nausea or vomiting. 07/03/21  Yes Campbell Riches, NP  pantoprazole (PROTONIX) 40 MG tablet Take 1 tablet (40 mg total) by mouth daily. For acid reflux 07/03/21  Yes Sherie Don C, NP  SUBOXONE 8-2 MG FILM Place 1 Film under the tongue daily. 07/22/21  Yes [provider]     Allergies:     Allergies  Allergen Reactions   Zomig [Zolmitriptan] Shortness Of Breath and Swelling    Imitrex is tolerated well     Physical Exam:   Vitals  Blood pressure (!) 89/59, pulse 68, temperature (!) 97.5 F (36.4 C), temperature source Oral, resp. rate 13, height 5\' 4"  (1.626 m), weight 56.7 kg, SpO2 95  %.   1.  General: Patient lying supine in bed,  no acute distress   2. Psychiatric: Alert and oriented x 3, mood and behavior normal for situation,  pleasant and cooperative with exam   3. Neurologic: Speech and language are normal, face is symmetric, moves all 4 extremities voluntarily, at baseline without acute deficits on limited exam   4. HEENMT:  Head is atraumatic, normocephalic, pupils reactive to light, neck is supple, trachea is midline, mucous membranes are moist   5. Respiratory : Lungs are clear to auscultation bilaterally without wheezing, rhonchi, rales, no cyanosis, no increase in work of breathing or accessory muscle use   6. Cardiovascular : Heart rate normal, rhythm is regular, no murmurs, rubs or gallops, no peripheral edema, peripheral pulses palpated   7. Gastrointestinal:  Abdomen is soft, nondistended, epigastric tenderness, bowel sounds active, no masses or organomegaly palpated   8. Skin:  Skin is warm, dry and intact without rashes, acute lesions, or ulcers on limited exam   9.Musculoskeletal:  No acute deformities or trauma, no asymmetry in tone, no peripheral edema, peripheral pulses palpated, no tenderness to palpation in the extremities     Data Review:    CBC Recent Labs  Lab 10/20/21 1140 10/20/21 2215 10/21/21 0342  WBC 12.1* 10.4 10.3  HGB 15.5* 13.9 12.2  HCT 43.7 39.4 34.1*  PLT 281 293 236  MCV 100.2* 98.3 100.0  MCH 35.6* 34.7* 35.8*  MCHC 35.5 35.3 35.8  RDW 13.8 13.6 13.7  LYMPHSABS 4.3* 4.0 4.2*  MONOABS 0.8 0.8 0.8  EOSABS 0.1 0.2 0.2  BASOSABS 0.1 0.1 0.1   ------------------------------------------------------------------------------------------------------------------  Results for orders placed or performed during the hospital encounter of 10/20/21 (from the past 48 hour(s))  Resp Panel by RT-PCR (Flu A&B, Covid) Nasopharyngeal Swab     Status: None   Collection Time: 10/20/21 10:05 PM   Specimen: Nasopharyngeal  Swab; Nasopharyngeal(NP) swabs in vial transport medium  Result Value Ref Range   SARS Coronavirus 2 by RT PCR NEGATIVE NEGATIVE    Comment: (NOTE) SARS-CoV-2 target nucleic acids are NOT DETECTED.  The SARS-CoV-2 RNA is generally detectable in upper respiratory specimens during the acute phase of infection. The lowest concentration of SARS-CoV-2 viral copies this assay can detect is 138 copies/mL. A negative result does not preclude SARS-Cov-2 infection and should not be used as the sole basis for treatment or other patient management decisions. A negative result may occur with  improper specimen collection/handling, submission of specimen other than nasopharyngeal swab, presence of viral mutation(s) within the areas targeted by this assay, and inadequate number of viral copies(<138 copies/mL). A negative result must be combined with clinical observations, patient history, and epidemiological information. The expected result is Negative.  Fact Sheet for Patients:  BloggerCourse.com  Fact Sheet for Healthcare Providers:  SeriousBroker.it  This test is no t yet approved or cleared by the [place] FDA and  has been authorized for detection and/or diagnosis of SARS-CoV-2 by FDA under an Emergency Use Authorization (EUA). This EUA will remain  in effect (meaning this test can be used) for the duration of the COVID-19 declaration under Section 564(b)(1) of the Act, 21 U.S.C.section 360bbb-3(b)(1), unless the authorization is terminated  or revoked sooner.       Influenza A by PCR NEGATIVE NEGATIVE   Influenza B by PCR NEGATIVE NEGATIVE    Comment: (NOTE) The Xpert Xpress SARS-CoV-2/FLU/RSV plus assay is intended as an aid in the diagnosis of influenza from Nasopharyngeal swab specimens and should not be used as a sole basis for treatment. Nasal washings and aspirates are unacceptable for Xpert Xpress  SARS-CoV-2/FLU/RSV testing.  Fact Sheet for Patients: BloggerCourse.com  Fact Sheet for Healthcare Providers: SeriousBroker.it  This test is not yet approved or cleared by the [place] FDA and has been authorized for detection and/or diagnosis of SARS-CoV-2 by FDA under an Emergency Use Authorization (EUA). This EUA will remain in effect (meaning this test can be used) for the duration of the COVID-19 declaration under Section 564(b)(1) of the Act, 21 U.S.C. section 360bbb-3(b)(1), unless the authorization is terminated or revoked.  Performed at [facility], [address], [place], [place] [address]   Basic metabolic panel     Status: Abnormal   Collection Time: 10/20/21 10:15 PM  Result Value Ref Range   Sodium 134 (L) 135 - 145 mmol/L   Potassium <2.0 (LL) 3.5 - 5.1 mmol/L    Comment: CRITICAL RESULT CALLED TO, READ BACK BY AND VERIFIED WITH: SPENCE,H ON 10/20/21 AT 2335 BY LOY,C    Chloride 83 (L) 98 - 111 mmol/L   CO2 40 (H) 22 - 32 mmol/L   Glucose, Bld 115 (H) 70 - 99 mg/dL    Comment: Glucose reference range applies only to samples taken after fasting for at least 8 hours.   BUN 6 6 - 20 mg/dL   Creatinine, Ser 1.91 0.44 - 1.00 mg/dL   Calcium 8.2 (L) 8.9 - 10.3 mg/dL   GFR, Estimated >47 >82 mL/min    Comment: (NOTE) Calculated using the CKD-EPI Creatinine Equation (2021)    Anion gap 11 5 - 15    Comment: Performed at [facility], [address] [place], [place] [address]  CBC with Differential     Status: Abnormal   Collection Time: 10/20/21 10:15 PM  Result Value Ref Range   WBC 10.4 4.0 - 10.5 K/uL   RBC 4.01 3.87 - 5.11 MIL/uL   Hemoglobin 13.9 12.0 - 15.0 g/dL   HCT 30.8 65.7 - 84.6 %   MCV 98.3 80.0 - 100.0 fL   MCH 34.7 (H) 26.0 - 34.0 pg   MCHC 35.3 30.0 - 36.0 g/dL   RDW 96.2 95.2 - 84.1 %   Platelets 293 150 - 400 K/uL   nRBC 0.0 0.0 - 0.2 %   Neutrophils Relative % 50 %   Neutro Abs 5.3  1.7 - 7.7 K/uL   Lymphocytes Relative 38 %   Lymphs Abs 4.0 0.7 - 4.0 K/uL   Monocytes Relative 8 %   Monocytes Absolute 0.8 0.1 - 1.0 K/uL   Eosinophils Relative 2 %   Eosinophils Absolute 0.2 0.0 - 0.5 K/uL   Basophils Relative 1 %   Basophils Absolute 0.1 0.0 - 0.1 K/uL   Immature Granulocytes 1 %   Abs Immature Granulocytes 0.05 0.00 - 0.07 K/uL    Comment: Performed at [facility], [address], [place], [place] [address]  Magnesium     Status: None   Collection Time: 10/20/21 10:15 PM  Result Value Ref Range   Magnesium 1.7 1.7 - 2.4 mg/dL    Comment: Performed at [facility], [address] [place], [place] [address]  hCG, serum, qualitative     Status: Abnormal   Collection Time: 10/20/21 10:15 PM  Result Value Ref Range   Preg, Serum POSITIVE (A) NEGATIVE    Comment:        THE SENSITIVITY OF THIS METHODOLOGY IS >10 mIU/mL. Performed at [facility], [address] [place], [place] [address]   hCG, quantitative, pregnancy     Status: Abnormal   Collection Time: 10/20/21 10:15 PM  Result Value Ref  Range   hCG, Beta Chain, Quant, S 13 (H) <5 mIU/mL    Comment:          GEST. AGE      CONC.  (mIU/mL)   <=1 WEEK        5 - 50     2 WEEKS       50 - 500     3 WEEKS       100 - 10,000     4 WEEKS     1,000 - 30,000     5 WEEKS     3,500 - 115,000   6-8 WEEKS     12,000 - 270,000    12 WEEKS     15,000 - 220,000        FEMALE AND NON-PREGNANT FEMALE:     LESS THAN 5 mIU/mL Performed at [facility], [address], [place], [place] [address]   CBC WITH DIFFERENTIAL     Status: Abnormal   Collection Time: 10/21/21  3:42 AM  Result Value Ref Range   WBC 10.3 4.0 - 10.5 K/uL   RBC 3.41 (L) 3.87 - 5.11 MIL/uL   Hemoglobin 12.2 12.0 - 15.0 g/dL   HCT 74.1 (L) 28.7 - 86.7 %   MCV 100.0 80.0 - 100.0 fL   MCH 35.8 (H) 26.0 - 34.0 pg   MCHC 35.8 30.0 - 36.0 g/dL   RDW 67.2 09.4 - 70.9 %   Platelets 236 150 - 400 K/uL   nRBC 0.0 0.0 - 0.2 %   Neutrophils Relative % 47  %   Neutro Abs 4.8 1.7 - 7.7 K/uL   Lymphocytes Relative 41 %   Lymphs Abs 4.2 (H) 0.7 - 4.0 K/uL   Monocytes Relative 8 %   Monocytes Absolute 0.8 0.1 - 1.0 K/uL   Eosinophils Relative 2 %   Eosinophils Absolute 0.2 0.0 - 0.5 K/uL   Basophils Relative 1 %   Basophils Absolute 0.1 0.0 - 0.1 K/uL   Immature Granulocytes 1 %   Abs Immature Granulocytes 0.10 (H) 0.00 - 0.07 K/uL    Comment: Performed at [facility], [address] [place], [place] [address]    Chemistries  Recent Labs  Lab 10/20/21 1140 10/20/21 2215  NA 136 134*  K <2.0* <2.0*  CL 84* 83*  CO2 37* 40*  GLUCOSE 102* 115*  BUN 6 6  CREATININE 0.41* 0.48  CALCIUM 8.3* 8.2*  MG  --  1.7  AST 40  --   ALT 16  --   ALKPHOS 114  --   BILITOT 1.3*  --    ------------------------------------------------------------------------------------------------------------------  ------------------------------------------------------------------------------------------------------------------ GFR: Estimated Creatinine Clearance: 79.9 mL/min (by C-G formula based on SCr of 0.48 mg/dL). Liver Function Tests: Recent Labs  Lab 10/20/21 1140  AST 40  ALT 16  ALKPHOS 114  BILITOT 1.3*  PROT 6.2*  ALBUMIN 2.8*   No results for input(s): LIPASE, AMYLASE in the last 168 hours. No results for input(s): AMMONIA in the last 168 hours. Coagulation Profile: No results for input(s): INR, PROTIME in the last 168 hours. Cardiac Enzymes: No results for input(s): CKTOTAL, CKMB, CKMBINDEX, TROPONINI in the last 168 hours. BNP (last 3 results) No results for input(s): PROBNP in the last 8760 hours. HbA1C: No results for input(s): HGBA1C in the last 72 hours. CBG: No results for input(s): GLUCAP in the last 168 hours. Lipid Profile: No results for input(s): CHOL, HDL, LDLCALC, TRIG, CHOLHDL, LDLDIRECT in the last 72  hours. Thyroid Function Tests: No results for input(s): TSH, T4TOTAL, FREET4, T3FREE, THYROIDAB in the last 72  hours. Anemia Panel: No results for input(s): VITAMINB12, FOLATE, FERRITIN, TIBC, IRON, RETICCTPCT in the last 72 hours.  --------------------------------------------------------------------------------------------------------------- Urine analysis:    Component Value Date/Time   COLORURINE YELLOW 02/11/2016 1430   APPEARANCEUR HAZY (A) 02/11/2016 1430   APPEARANCEUR Cloudy (A) 07/16/2015 1600   LABSPEC 1.025 02/11/2016 1430   PHURINE 6.0 02/11/2016 1430   GLUCOSEU NEGATIVE 02/11/2016 1430   HGBUR NEGATIVE 02/11/2016 1430   BILIRUBINUR NEGATIVE 02/11/2016 1430   BILIRUBINUR Negative 07/16/2015 1600   KETONESUR >80 (A) 02/11/2016 1430   PROTEINUR NEGATIVE 02/11/2016 1430   UROBILINOGEN 0.2 06/11/2008 0059   NITRITE NEGATIVE 02/11/2016 1430   LEUKOCYTESUR NEGATIVE 02/11/2016 1430   LEUKOCYTESUR Negative 07/16/2015 1600      Imaging Results:    No results found.     Assessment & Plan:    Active Problems:   Smoker   GERD (gastroesophageal reflux disease)   Sludge in gallbladder   Hypokalemia   Protein calorie malnutrition (HCC)   Hypotension   Acute Hypokalemia Potassium less than two 70 mEq ordered by ED more over night Another 40 Meq ordered PO for the AM Mag 1.7 Patient reports no normal meal since April - likely 2/2 poor PO intake Trend in the AM Sludge in Gallbladder Plan for lap chole once the K+ is above 3.0 Continue pain control and zofran Continue zosyn RUQ [place] was 9/22 - showing sludge Gen surg to see in the AM GERD Continue Protonix Tobacco use disorder Declines nicotine patch at this time Counseled on importance of cessation Hypotension  2/2 to opiates most likely with dehydration contributing 2/2 decreased PO intake Sepsis is very low on the differential - continue antibiotics, trend leukocytosis 2 boluses ordered, start midodrine Reduced dose of morphine for pain control Protein cal mal Moderate with albumin 2.8 2/2 poor PO intake  2/2 gallbladder pain/symptoms Encourage nutrient dense food choices when patient is able to tolerate PO Abnormal pregnancy test In office quany HCG 14 In clinic quant HCG 13 - down trending Hx of tubal ligation Likely elevated HCG for non-pregnancy related etiology Will need further work-up outpatient Chronic pain Continue gabapentin    DVT Prophylaxis-   SCDs   AM Labs Ordered, also please review Full Orders  Family Communication: No family at bedside  Code Status: Full  Admission status: Inpatient :The appropriate admission status for this patient is INPATIENT. Inpatient status is judged to be reasonable and necessary in order to provide the required intensity of service to ensure the patient's safety. The patient's presenting symptoms, physical exam findings, and initial radiographic and laboratory data in the context of their chronic comorbidities is felt to place them at high risk for further clinical deterioration. Furthermore, it is not anticipated that the patient will be medically stable for discharge from the hospital within 2 midnights of admission. The following factors support the admission status of inpatient.     The patient's presenting symptoms include abnormal labs. The worrisome physical exam findings include epigastric tenderness. The initial radiographic and laboratory data are worrisome because of leukocytosis, hypokalemia. The chronic co-morbidities include anxiety, depression, tobacco use disorder, chronic pain,.       * I certify that at the point of admission it is my clinical judgment that the patient will require inpatient hospital care spanning beyond 2 midnights from the point of admission due to high intensity of service, high  risk for further deterioration and high frequency of surveillance required.*  Disposition: Anticipated Discharge date 48 - 72 hours Discharge to home  Time spent in minutes : 65   Avalie Oconnor B Zierle-Ghosh DO

## 2021-10-22 ENCOUNTER — Encounter (HOSPITAL_COMMUNITY)
Admission: EM | Disposition: A | Discharge status: Home / Self Care | Payer: Self-pay | Source: Home / Self Care | Attending: General Surgery

## 2021-10-22 ENCOUNTER — Inpatient Hospital Stay (HOSPITAL_COMMUNITY): Payer: Medicaid Other | Admitting: Certified Registered Nurse Anesthetist

## 2021-10-22 ENCOUNTER — Encounter (HOSPITAL_COMMUNITY): Payer: Self-pay | Admitting: Family Medicine

## 2021-10-22 ENCOUNTER — Other Ambulatory Visit: Payer: Self-pay

## 2021-10-22 DIAGNOSIS — K8020 Calculus of gallbladder without cholecystitis without obstruction: Secondary | ICD-10-CM

## 2021-10-22 HISTORY — PX: CHOLECYSTECTOMY: SHX55

## 2021-10-22 LAB — SURGICAL PCR SCREEN
MRSA, PCR: NEGATIVE
Staphylococcus aureus: NEGATIVE

## 2021-10-22 LAB — BASIC METABOLIC PANEL
Anion gap: 7 (ref 5–15)
BUN: <5 mg/dL — ABNORMAL LOW (ref 6–20)
CO2: 28 mmol/L (ref 22–32)
Calcium: 7.7 mg/dL — ABNORMAL LOW (ref 8.9–10.3)
Chloride: 101 mmol/L (ref 98–111)
Creatinine, Ser: 0.41 mg/dL — ABNORMAL LOW (ref 0.44–1.00)
GFR, Estimated: >60 mL/min (ref >60)
Glucose, Bld: 101 mg/dL — ABNORMAL HIGH (ref 70–99)
Potassium: 3.9 mmol/L (ref 3.5–5.1)
Sodium: 136 mmol/L (ref 135–145)

## 2021-10-22 LAB — HEMOGLOBIN AND HEMATOCRIT, BLOOD
HCT: 35.1 % — ABNORMAL LOW (ref 36.0–46.0)
Hemoglobin: 11.9 g/dL — ABNORMAL LOW (ref 12.0–15.0)

## 2021-10-22 LAB — ACTH STIMULATION, 3 TIME POINTS
Cortisol, 30 Min: 25.3 ug/dL
Cortisol, 60 Min: 30.6 ug/dL
Cortisol, Base: 6.6 ug/dL

## 2021-10-22 SURGERY — LAPAROSCOPIC CHOLECYSTECTOMY
Anesthesia: General | Site: Abdomen

## 2021-10-22 MED ORDER — BUPIVACAINE LIPOSOME 1.3 % IJ SUSP
INTRAMUSCULAR | Status: AC
  Filled 2021-10-22: qty 20

## 2021-10-22 MED ORDER — SODIUM CHLORIDE 0.9 % IR SOLN
Status: DC | PRN
  Administered 2021-10-22: 1000 mL

## 2021-10-22 MED ORDER — ORAL CARE MOUTH RINSE: 15.0000 mL | Freq: Once | OROMUCOSAL | Status: AC

## 2021-10-22 MED ORDER — ONDANSETRON HCL 4 MG/2ML IJ SOLN
4.0000 mg | Freq: Once | INTRAMUSCULAR | Status: AC | PRN
  Administered 2021-10-22: 4 mg via INTRAVENOUS

## 2021-10-22 MED ORDER — CEFAZOLIN SODIUM-DEXTROSE 2-4 GM/100ML-% IV SOLN
INTRAVENOUS | Status: AC
  Filled 2021-10-22: qty 100

## 2021-10-22 MED ORDER — LIDOCAINE HCL (PF) 2 % IJ SOLN
INTRAMUSCULAR | Status: AC
  Filled 2021-10-22: qty 15

## 2021-10-22 MED ORDER — LACTATED RINGERS IV SOLN: INTRAVENOUS | Status: DC

## 2021-10-22 MED ORDER — LIDOCAINE HCL (CARDIAC) PF 100 MG/5ML IV SOSY
PREFILLED_SYRINGE | INTRAVENOUS | Status: DC | PRN
  Administered 2021-10-22: 40 mg via INTRATRACHEAL

## 2021-10-22 MED ORDER — SCOPOLAMINE 1 MG/3DAYS TD PT72
MEDICATED_PATCH | TRANSDERMAL | Status: AC
  Filled 2021-10-22: qty 1

## 2021-10-22 MED ORDER — CHLORHEXIDINE GLUCONATE CLOTH 2 % EX PADS: 6.0000 | MEDICATED_PAD | Freq: Once | CUTANEOUS | Status: DC

## 2021-10-22 MED ORDER — FENTANYL CITRATE PF 50 MCG/ML IJ SOSY
PREFILLED_SYRINGE | INTRAMUSCULAR | Status: AC
  Filled 2021-10-22: qty 1

## 2021-10-22 MED ORDER — SODIUM CHLORIDE 0.9 % IV SOLN: INTRAVENOUS | Status: DC

## 2021-10-22 MED ORDER — OXYCODONE HCL 5 MG PO TABS
ORAL_TABLET | ORAL | Status: AC
  Filled 2021-10-22: qty 1

## 2021-10-22 MED ORDER — ONDANSETRON HCL 4 MG/2ML IJ SOLN
INTRAMUSCULAR | Status: AC
  Filled 2021-10-22: qty 4

## 2021-10-22 MED ORDER — ONDANSETRON HCL 4 MG/2ML IJ SOLN
INTRAMUSCULAR | Status: DC | PRN
  Administered 2021-10-22: 4 mg via INTRAVENOUS

## 2021-10-22 MED ORDER — PROPOFOL 10 MG/ML IV BOLUS
INTRAVENOUS | Status: DC | PRN
  Administered 2021-10-22: 180 mg via INTRAVENOUS

## 2021-10-22 MED ORDER — LORAZEPAM 2 MG/ML IJ SOLN: 1.0000 mg | INTRAMUSCULAR | Status: DC | PRN

## 2021-10-22 MED ORDER — FENTANYL CITRATE PF 50 MCG/ML IJ SOSY
PREFILLED_SYRINGE | INTRAMUSCULAR | Status: AC
  Filled 2021-10-22: qty 2

## 2021-10-22 MED ORDER — SUGAMMADEX SODIUM 500 MG/5ML IV SOLN
INTRAVENOUS | Status: DC | PRN
Start: 2021-10-22 — End: 2021-10-22
  Administered 2021-10-22: 200 mg via INTRAVENOUS

## 2021-10-22 MED ORDER — MIDAZOLAM HCL 2 MG/2ML IJ SOLN
INTRAMUSCULAR | Status: AC
  Filled 2021-10-22: qty 2

## 2021-10-22 MED ORDER — DEXAMETHASONE SODIUM PHOSPHATE 10 MG/ML IJ SOLN
INTRAMUSCULAR | Status: AC
  Filled 2021-10-22: qty 1

## 2021-10-22 MED ORDER — POTASSIUM CHLORIDE 10 MEQ/100ML IV SOLN
INTRAVENOUS | Status: AC
  Filled 2021-10-22: qty 100

## 2021-10-22 MED ORDER — FENTANYL CITRATE (PF) 250 MCG/5ML IJ SOLN
INTRAMUSCULAR | Status: AC
  Filled 2021-10-22: qty 5

## 2021-10-22 MED ORDER — ROCURONIUM BROMIDE 10 MG/ML (PF) SYRINGE
PREFILLED_SYRINGE | INTRAVENOUS | Status: DC | PRN
Start: 2021-10-22 — End: 2021-10-22
  Administered 2021-10-22: 30 mg via INTRAVENOUS
  Administered 2021-10-22: 10 mg via INTRAVENOUS

## 2021-10-22 MED ORDER — FENTANYL CITRATE (PF) 100 MCG/2ML IJ SOLN
INTRAMUSCULAR | Status: AC
  Filled 2021-10-22: qty 2

## 2021-10-22 MED ORDER — HEMOSTATIC AGENTS (NO CHARGE) OPTIME
TOPICAL | Status: DC | PRN
  Administered 2021-10-22 (×3): 1 via TOPICAL

## 2021-10-22 MED ORDER — SIMETHICONE 80 MG PO CHEW
40.0000 mg | CHEWABLE_TABLET | Freq: Four times a day (QID) | ORAL | Status: DC | PRN
  Administered 2021-10-22: 40 mg via ORAL
  Filled 2021-10-22: qty 1

## 2021-10-22 MED ORDER — FENTANYL CITRATE (PF) 100 MCG/2ML IJ SOLN
INTRAMUSCULAR | Status: DC | PRN
  Administered 2021-10-22: 50 ug via INTRAVENOUS
  Administered 2021-10-22: 150 ug via INTRAVENOUS
  Administered 2021-10-22 (×3): 50 ug via INTRAVENOUS

## 2021-10-22 MED ORDER — BUPIVACAINE LIPOSOME 1.3 % IJ SUSP
INTRAMUSCULAR | Status: DC | PRN
  Administered 2021-10-22: 20 mL

## 2021-10-22 MED ORDER — CEFAZOLIN SODIUM-DEXTROSE 2-4 GM/100ML-% IV SOLN: 2.0000 g | INTRAVENOUS | Status: DC

## 2021-10-22 MED ORDER — SCOPOLAMINE 1 MG/3DAYS TD PT72
1.0000 | MEDICATED_PATCH | Freq: Once | TRANSDERMAL | Status: DC
  Administered 2021-10-22: 1.5 mg via TRANSDERMAL

## 2021-10-22 MED ORDER — ROCURONIUM BROMIDE 10 MG/ML (PF) SYRINGE
PREFILLED_SYRINGE | INTRAVENOUS | Status: AC
  Filled 2021-10-22: qty 10

## 2021-10-22 MED ORDER — SUCCINYLCHOLINE CHLORIDE 200 MG/10ML IV SOSY
PREFILLED_SYRINGE | INTRAVENOUS | Status: DC | PRN
Start: 2021-10-22 — End: 2021-10-22
  Administered 2021-10-22: 120 mg via INTRAVENOUS

## 2021-10-22 MED ORDER — FENTANYL CITRATE PF 50 MCG/ML IJ SOSY
25.0000 ug | PREFILLED_SYRINGE | INTRAMUSCULAR | Status: DC | PRN
  Administered 2021-10-22 (×3): 50 ug via INTRAVENOUS

## 2021-10-22 MED ORDER — HYDROMORPHONE HCL 1 MG/ML IJ SOLN
1.0000 mg | INTRAMUSCULAR | Status: DC | PRN
  Administered 2021-10-22 – 2021-10-23 (×6): 1 mg via INTRAVENOUS
  Filled 2021-10-22 (×6): qty 1

## 2021-10-22 MED ORDER — CEFAZOLIN SODIUM-DEXTROSE 2-4 GM/100ML-% IV SOLN
2.0000 g | INTRAVENOUS | Status: AC
  Administered 2021-10-22: 2 g via INTRAVENOUS

## 2021-10-22 MED ORDER — CHLORHEXIDINE GLUCONATE 0.12 % MT SOLN
15.0000 mL | Freq: Once | OROMUCOSAL | Status: AC
  Administered 2021-10-22: 15 mL via OROMUCOSAL

## 2021-10-22 MED ORDER — MIDAZOLAM HCL 2 MG/2ML IJ SOLN
INTRAMUSCULAR | Status: DC | PRN
  Administered 2021-10-22: 2 mg via INTRAVENOUS

## 2021-10-22 SURGICAL SUPPLY — 49 items
ADH SKN CLS APL DERMABOND .7 (GAUZE/BANDAGES/DRESSINGS) ×1
APL PRP STRL LF DISP 70% ISPRP (MISCELLANEOUS) ×1
APL SRG 38 LTWT LNG FL B (MISCELLANEOUS) ×1
APPLICATOR ARISTA FLEXITIP XL (MISCELLANEOUS) ×2 IMPLANT
APPLIER CLIP ROT 10 11.4 M/L (STAPLE) ×3
APR CLP MED LRG 11.4X10 (STAPLE) ×1
BAG RETRIEVAL 10 (BASKET) ×1
BAG RETRIEVAL 10MM (BASKET) ×1
CHLORAPREP W/TINT 26 (MISCELLANEOUS) ×3 IMPLANT
CLIP APPLIE ROT 10 11.4 M/L (STAPLE) ×1 IMPLANT
CLOTH BEACON ORANGE TIMEOUT ST (SAFETY) ×3 IMPLANT
COVER LIGHT HANDLE STERIS (MISCELLANEOUS) ×6 IMPLANT
DERMABOND ADVANCED (GAUZE/BANDAGES/DRESSINGS) ×2
DERMABOND ADVANCED .7 DNX12 (GAUZE/BANDAGES/DRESSINGS) ×1 IMPLANT
ELECT REM PT RETURN 9FT ADLT (ELECTROSURGICAL) ×3
ELECTRODE REM PT RTRN 9FT ADLT (ELECTROSURGICAL) ×1 IMPLANT
GAUZE 4X4 16PLY ~~LOC~~+RFID DBL (SPONGE) ×3 IMPLANT
GLOVE SURG POLYISO LF SZ7.5 (GLOVE) ×3 IMPLANT
GLOVE SURG UNDER POLY LF SZ7 (GLOVE) ×6 IMPLANT
GOWN STRL REUS W/TWL LRG LVL3 (GOWN DISPOSABLE) ×9 IMPLANT
HEMOSTAT ARISTA ABSORB 3G PWDR (HEMOSTASIS) ×2 IMPLANT
HEMOSTAT SNOW SURGICEL 2X4 (HEMOSTASIS) ×5 IMPLANT
INST SET LAPROSCOPIC AP (KITS) ×3 IMPLANT
KIT TURNOVER KIT A (KITS) ×3 IMPLANT
MANIFOLD NEPTUNE II (INSTRUMENTS) ×3 IMPLANT
NDL HYPO 18GX1.5 BLUNT FILL (NEEDLE) ×1 IMPLANT
NDL HYPO 21X1.5 SAFETY (NEEDLE) ×1 IMPLANT
NDL INSUFFLATION 14GA 120MM (NEEDLE) ×1 IMPLANT
NEEDLE HYPO 18GX1.5 BLUNT FILL (NEEDLE) ×3 IMPLANT
NEEDLE HYPO 21X1.5 SAFETY (NEEDLE) ×3 IMPLANT
NEEDLE INSUFFLATION 14GA 120MM (NEEDLE) ×3 IMPLANT
NS IRRIG 1000ML POUR BTL (IV SOLUTION) ×3 IMPLANT
PACK LAP CHOLE LZT030E (CUSTOM PROCEDURE TRAY) ×3 IMPLANT
PAD ARMBOARD 7.5X6 YLW CONV (MISCELLANEOUS) ×3 IMPLANT
PENCIL HANDSWITCHING (ELECTRODE) ×2 IMPLANT
SET BASIN LINEN APH (SET/KITS/TRAYS/PACK) ×3 IMPLANT
SET TUBE SMOKE EVAC HIGH FLOW (TUBING) ×3 IMPLANT
SLEEVE ENDOPATH XCEL 5M (ENDOMECHANICALS) ×3 IMPLANT
SUT MNCRL AB 4-0 PS2 18 (SUTURE) ×6 IMPLANT
SUT VICRYL 0 UR6 27IN ABS (SUTURE) ×3 IMPLANT
SYR 20ML LL LF (SYRINGE) ×6 IMPLANT
SYS BAG RETRIEVAL 10MM (BASKET) ×1
SYSTEM BAG RETRIEVAL 10MM (BASKET) ×1 IMPLANT
TROCAR ENDO BLADELESS 11MM (ENDOMECHANICALS) ×3 IMPLANT
TROCAR XCEL NON-BLD 5MMX100MML (ENDOMECHANICALS) ×3 IMPLANT
TROCAR XCEL UNIV SLVE 11M 100M (ENDOMECHANICALS) ×3 IMPLANT
TUBE CONNECTING 12'X1/4 (SUCTIONS) ×1
TUBE CONNECTING 12X1/4 (SUCTIONS) ×2 IMPLANT
WARMER LAPAROSCOPE (MISCELLANEOUS) ×3 IMPLANT

## 2021-10-22 NOTE — Anesthesia Postprocedure Evaluation (Signed)
Anesthesia Post Note  Patient: Pamela [name]  Procedure(s) Performed: LAPAROSCOPIC CHOLECYSTECTOMY (Abdomen)  Patient location during evaluation: Phase II Anesthesia Type: General Level of consciousness: awake Pain management: pain level controlled Vital Signs Assessment: post-procedure vital signs reviewed and stable Respiratory status: spontaneous breathing and respiratory function stable Cardiovascular status: blood pressure returned to baseline and stable Postop Assessment: no headache and no apparent nausea or vomiting Anesthetic complications: no Comments: Late entry   No notable events documented.   Last Vitals:  Vitals:   10/22/21 1430 10/22/21 1445  BP: 123/70 120/81  Pulse: 60 65  Resp: 16 18  Temp:    SpO2: 100% 100%    Last Pain:  Vitals:   10/22/21 1451  TempSrc:   PainSc: Asleep                 Windell Norfolk

## 2021-10-22 NOTE — Op Note (Signed)
Patient:  Pamela [name]  DOB:  29-Jan-1981  MRN:  [id number]   Preop Diagnosis: Biliary colic secondary to cholelithiasis  Postop Diagnosis: Same  Procedure: Laparoscopic cholecystectomy  Surgeon: Franky Macho, MD  Assistant: Algis Greenhouse, MD  Anes: General endotracheal  Indications: Patient is a 40 year old white female who presents with a several month history of worsening right upper quadrant abdominal pain and nausea.  She has also had LFT abnormalities.  She now presents for laparoscopic cholecystectomy due to biliary sludge.  The risks and benefits of the procedure including bleeding, infection, hepatobiliary injury, the possibility of an open procedure were fully explained to the patient, who gave informed consent.  Procedure note: The patient was placed in supine position.  After induction of general endotracheal anesthesia, the abdomen was prepped and draped using usual sterile technique with ChloraPrep.  Surgical site confirmation was performed.  A supraumbilical incision was made down to the fascia.  A Veress needle was introduced into the abdominal cavity and confirmation of placement was done using the saline drop test.  The abdomen was then insufflated to 15 mmHg pressure.  An 11 mm trocar was introduced into the abdominal cavity under direct visualization without difficulty.  The patient was placed in reverse Trendelenburg position and an additional 1 mm trocar was placed in the epigastric region and 5 mm trochars were placed the right upper quadrant and right flank regions.  Liver was inspected noted to be enlarged and Calot the costal margin.  The gallbladder was retracted in a dynamic fashion in order to provide a critical view of the triangle of Calot.  The cystic duct was first identified.  Its junction to the infundibulum was fully identified.  Endoclips were placed proximally distally on the cystic duct, and the cystic duct was divided.  This was likewise done to the  cystic artery.  The gallbladder was freed away from the gallbladder fossa using Bovie electrocautery.  While removing the gallbladder from the liver, a transversing vessel was identified.  I had to increase the cauterization of the liver bed in order to control the bleeding.  Arista and Surgicel were placed in this region and we reinspected the area after desufflation and several minutes of waiting and no further bleeding was noted.  The gallbladder was delivered through the epigastric trocar site using an Endo Catch bag.  All air was then evacuated from the abdominal cavity prior to removal of the trochars.  All wounds were irrigated with normal saline.  All wounds were injected with Exparel.  All incisions were closed using a 4-0 Monocryl subcuticular suture.  Dermabond was applied.  All tape and needle counts were correct at the end of the procedure.  The patient was extubated in the operating room and transferred to PACU in stable condition.  Complications: None  EBL: 150 cc  Specimen: Gallbladder

## 2021-10-22 NOTE — Interval H&P Note (Signed)
History and Physical Interval Note:  10/22/2021 12:01 PM  Pamela [name]  has presented today for surgery, with the diagnosis of Cholelithiasis.  The various methods of treatment have been discussed with the patient and family. After consideration of risks, benefits and other options for treatment, the patient has consented to  Procedure(s): LAPAROSCOPIC CHOLECYSTECTOMY (N/A) as a surgical intervention.  The patient's history has been reviewed, patient examined, no change in status, stable for surgery.  I have reviewed the patient's chart and labs.  Questions were answered to the patient's satisfaction.     Franky Macho

## 2021-10-22 NOTE — Anesthesia Procedure Notes (Signed)
Procedure Name: Intubation Date/Time: 10/22/2021 1:14 PM Performed by: Karna Dupes, CRNA Pre-anesthesia Checklist: Patient identified, Emergency Drugs available, Suction available and Patient being monitored Patient Re-evaluated:Patient Re-evaluated prior to induction Oxygen Delivery Method: Circle system utilized Preoxygenation: Pre-oxygenation with 100% oxygen Induction Type: IV induction Ventilation: Mask ventilation without difficulty Laryngoscope Size: Mac and 3 Grade View: Grade I Tube type: Oral Tube size: 7.0 mm Number of attempts: 1 Airway Equipment and Method: Stylet Placement Confirmation: ETT inserted through vocal cords under direct vision, positive ETCO2 and breath sounds checked- equal and bilateral Secured at: 21 cm Tube secured with: Tape Dental Injury: Teeth and Oropharynx as per pre-operative assessment

## 2021-10-22 NOTE — Transfer of Care (Signed)
Immediate Anesthesia Transfer of Care Note  Patient: Pamela [name]  Procedure(s) Performed: LAPAROSCOPIC CHOLECYSTECTOMY (Abdomen)  Patient Location: PACU  Anesthesia Type:General  Level of Consciousness: awake  Airway & Oxygen Therapy: Patient Spontanous Breathing and Patient connected to nasal cannula oxygen  Post-op Assessment: Report given to RN and Post -op Vital signs reviewed and stable  Post vital signs: Reviewed and stable  Last Vitals:  Vitals Value Taken Time  BP    Temp    Pulse    Resp    SpO2      Last Pain:  Vitals:   10/22/21 1138  TempSrc: Oral  PainSc: 7       Patients Stated Pain Goal: 5 (10/22/21 1138)  Complications: No notable events documented.

## 2021-10-22 NOTE — Anesthesia Preprocedure Evaluation (Signed)
Anesthesia Evaluation  Patient identified by MRN, date of birth, ID band Patient awake    Reviewed: Allergy & Precautions, H&P , NPO status , Patient's Chart, lab work & pertinent test results, reviewed documented beta blocker date and time   Airway Mallampati: II  TM Distance: >3 FB Neck ROM: full    Dental no notable dental hx.    Pulmonary neg pulmonary ROS, Current Smoker,    Pulmonary exam normal breath sounds clear to auscultation       Cardiovascular Exercise Tolerance: Good negative cardio ROS   Rhythm:regular Rate:Normal     Neuro/Psych  Headaches, PSYCHIATRIC DISORDERS Anxiety Depression  Neuromuscular disease    GI/Hepatic Neg liver ROS, GERD  Medicated,  Endo/Other  Hyperthyroidism   Renal/GU negative Renal ROS  negative genitourinary   Musculoskeletal   Abdominal   Peds  Hematology negative hematology ROS (+)   Anesthesia Other Findings HcG mildly elevated though trending down.  Per internal medicine, not indicative of pregnancy.  For outpatient followup.  Reproductive/Obstetrics negative OB ROS                             Anesthesia Physical Anesthesia Plan  ASA: 3  Anesthesia Plan: General and General ETT   Post-op Pain Management:    Induction:   PONV Risk Score and Plan: Ondansetron and Scopolamine patch - Pre-op  Airway Management Planned:   Additional Equipment:   Intra-op Plan:   Post-operative Plan:   Informed Consent: I have reviewed the patients History and Physical, chart, labs and discussed the procedure including the risks, benefits and alternatives for the proposed anesthesia with the patient or authorized representative who has indicated his/her understanding and acceptance.     Dental Advisory Given  Plan Discussed with: CRNA  Anesthesia Plan Comments:         Anesthesia Quick Evaluation

## 2021-10-23 ENCOUNTER — Encounter (HOSPITAL_COMMUNITY): Payer: Self-pay | Admitting: General Surgery

## 2021-10-23 LAB — COMPREHENSIVE METABOLIC PANEL
ALT: 23 U/L (ref 0–44)
AST: 76 U/L — ABNORMAL HIGH (ref 15–41)
Albumin: 2.4 g/dL — ABNORMAL LOW (ref 3.5–5.0)
Alkaline Phosphatase: 85 U/L (ref 38–126)
Anion gap: 7 (ref 5–15)
BUN: <5 mg/dL — ABNORMAL LOW (ref 6–20)
CO2: 27 mmol/L (ref 22–32)
Calcium: 8.2 mg/dL — ABNORMAL LOW (ref 8.9–10.3)
Chloride: 102 mmol/L (ref 98–111)
Creatinine, Ser: <0.3 mg/dL — ABNORMAL LOW (ref 0.44–1.00)
Glucose, Bld: 115 mg/dL — ABNORMAL HIGH (ref 70–99)
Potassium: 4 mmol/L (ref 3.5–5.1)
Sodium: 136 mmol/L (ref 135–145)
Total Bilirubin: 1.5 mg/dL — ABNORMAL HIGH (ref 0.3–1.2)
Total Protein: 5.4 g/dL — ABNORMAL LOW (ref 6.5–8.1)

## 2021-10-23 LAB — CBC
HCT: 36.7 % (ref 36.0–46.0)
Hemoglobin: 12.5 g/dL (ref 12.0–15.0)
MCH: 35.3 pg — ABNORMAL HIGH (ref 26.0–34.0)
MCHC: 34.1 g/dL (ref 30.0–36.0)
MCV: 103.7 fL — ABNORMAL HIGH (ref 80.0–100.0)
Platelets: 265 10*3/uL (ref 150–400)
RBC: 3.54 MIL/uL — ABNORMAL LOW (ref 3.87–5.11)
RDW: 14.1 % (ref 11.5–15.5)
WBC: 16.6 10*3/uL — ABNORMAL HIGH (ref 4.0–10.5)
nRBC: 0 % (ref 0.0–0.2)

## 2021-10-23 MED ORDER — OXYCODONE-ACETAMINOPHEN 5-325 MG PO TABS: 1.0000 | ORAL_TABLET | ORAL | 0 refills | Status: DC | PRN

## 2021-10-23 MED ORDER — ONDANSETRON 8 MG PO TBDP: 8.0000 mg | ORAL_TABLET | Freq: Three times a day (TID) | ORAL | 1 refills | Status: DC | PRN

## 2021-10-23 NOTE — Plan of Care (Signed)

## 2021-10-23 NOTE — Progress Notes (Signed)
Patient discharged home today, transported by family home. SWOT Nurse went over discharge paperwork with patient, patient verbalized understanding. Belongings sent home with patient. Patient stable upon discharge.

## 2021-10-23 NOTE — Discharge Summary (Signed)
Physician Discharge Summary  Patient ID: Calene Paradiso MRN: [id number] DOB/AGE: 41-Jun-1982 41 y.o.  Admit date: 10/20/2021 Discharge date: 10/23/2021  Admission Diagnoses: Biliary colic, biliary sludge, hypokalemia  Discharge Diagnoses: Same Active Problems:   Smoker   GERD (gastroesophageal reflux disease)   Sludge in gallbladder   Hypokalemia   Protein calorie malnutrition (HCC)   Hypotension   Calculus of gallbladder without cholecystitis without obstruction   Discharged Condition: good  Hospital Course: Patient is a 40 year old white female with a longstanding history of right upper quadrant abdominal pain, nausea, and vomiting who initially was scheduled to undergo laparoscopic cholecystectomy.  Her preoperative testing revealed a potassium of 1.7.  She was admitted to the hospital and given multiple potassium runs.  She subsequently underwent a laparoscopic cholecystectomy on 10/22/2021.  She tolerated the procedure well.  Postoperative course was remarkable for pain and nausea control.  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  Her diet was advanced without difficulty.  Her hemoglobin remained stable.  She is being discharged home on 10/23/2021 in good and improving condition.  Patient will be discharged on Percocet and was told not to use Suboxone.  She states she has been off of it for a while now.   Treatments: surgery: Laparoscopic cholecystectomy on 10/22/2021  Discharge Exam: Blood pressure 108/72, pulse 76, temperature 98 F (36.7 C), resp. rate 19, height 5\' 4"  (1.626 m), weight 56.7 kg, SpO2 97 %. General appearance: alert, cooperative, and no distress Resp: clear to auscultation bilaterally Cardio: regular rate and rhythm, S1, S2 normal, no murmur, click, rub or gallop GI: Soft, incisions healing well.  Disposition: Discharge disposition: 01-Home or Self Care       Discharge Instructions     Diet - low sodium heart healthy   Complete by: As directed     Increase activity slowly   Complete by: As directed       Allergies as of 10/23/2021       Reactions   Zomig [zolmitriptan] Shortness Of Breath, Swelling   Imitrex is tolerated well        Medication List     STOP taking these medications    Suboxone 8-2 MG Film Generic drug: Buprenorphine HCl-Naloxone HCl       TAKE these medications    aspirin EC 81 MG tablet Take 81 mg by mouth daily. Swallow whole.   gabapentin 100 MG capsule Commonly known as: NEURONTIN Take 100 mg by mouth 3 (three) times daily.   ondansetron 8 MG disintegrating tablet Commonly known as: Zofran ODT Take 1 tablet (8 mg total) by mouth every 8 (eight) hours as needed for nausea or vomiting.   oxyCODONE-acetaminophen 5-325 MG tablet Commonly known as: Percocet Take 1 tablet by mouth every 4 (four) hours as needed for severe pain.   pantoprazole 40 MG tablet Commonly known as: PROTONIX Take 1 tablet (40 mg total) by mouth daily. For acid reflux        Follow-up Information     10/25/2021, MD Follow up.   Specialty: General Surgery Why: As needed.  Will call you next week for follow up. Contact information: [address] [place] [address]                 Signed: [phone] 10/23/2021, 8:16 AM

## 2021-10-24 ENCOUNTER — Telehealth: Payer: Self-pay

## 2021-10-24 LAB — SURGICAL PATHOLOGY

## 2021-10-24 NOTE — Telephone Encounter (Signed)
Transition Care Management Follow-up Telephone Call Date of discharge and from where: 10/23/2021 from [facility] How have you been since you were released from the hospital? Pt stated that she is sore but did not have any questions or concerns at this time.  Any questions or concerns? No  Items Reviewed: Did the pt receive and understand the discharge instructions provided? Yes  Medications obtained and verified? Yes  Other? No  Any new allergies since your discharge? No  Dietary orders reviewed? No Do you have support at home? Yes   Functional Questionnaire: (I = Independent and D = Dependent) ADLs: I  Bathing/Dressing- I  Meal Prep- I  Eating- I  Maintaining continence- I  Transferring/Ambulation- I  Managing Meds- I   Follow up appointments reviewed:  PCP Hospital f/u appt confirmed? No   Specialist Hospital f/u appt confirmed? Yes  Follow up with Surgeon Are transportation arrangements needed? No  If their condition worsens, is the pt aware to call PCP or go to the Emergency Dept.? Yes Was the patient provided with contact information for the PCP's office or ED? Yes Was to pt encouraged to call back with questions or concerns? Yes

## 2022-02-12 IMAGING — MR MR ABDOMEN WO/W CM MRCP
22 of 24 series · 45 of 48 positions shown · IV contrast (6 ml Gadavist)
Comparison: Ultrasound August 28, 2021.

CLINICAL DATA: Dilated common bile duct with mild intrahepatic
biliary ductal dilation, elevated LFTs, concern for
choledocholithiasis.

EXAM:
MRI ABDOMEN WITHOUT AND WITH CONTRAST (INCLUDING MRCP)
TECHNIQUE: Multiplanar multisequence MR imaging of the abdomen was performed
both before and after the administration of intravenous contrast.
Heavily T2-weighted images of the biliary and pancreatic ducts were
obtained, and three-dimensional MRCP images were rendered by post
processing.
CONTRAST:  6mL GADAVIST GADOBUTROL 1 MMOL/ML IV SOLN

[Series 3: cor haste · coronal · 6.0mm · 1.25mm/px · 1 of 26 slices shown]
[im 1/26]
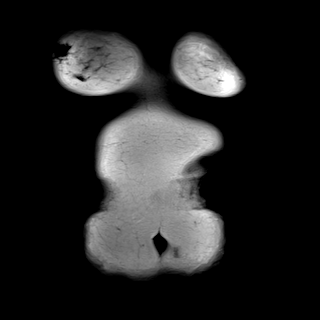

[Series 6: T2 fat-sat · axial · 6.0mm · 1.19mm/px · 1 of 38 slices shown]
[im 1/38]
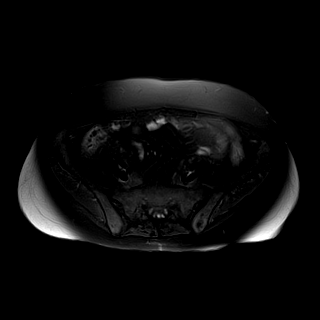

[Series 7: DWI · axial · 6.0mm · 1.42mm/px · 1 of 35 slices shown (1 of 4)]
[im 1/35]
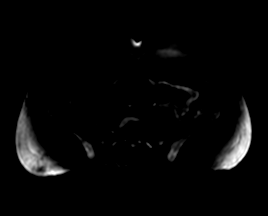

[Series 7: DWI · axial · 6.0mm · 1.42mm/px · 1 of 35 slices shown (2 of 4)]
[im 1/35]
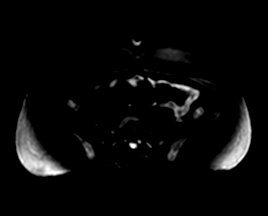

[Series 7: DWI · axial · 6.0mm · 1.42mm/px · 1 of 35 slices shown (3 of 4)]
[im 1/35]
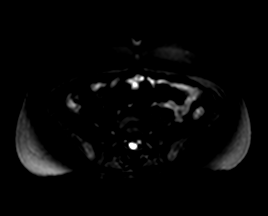

[Series 8: DWI · axial · 6.0mm · 1.42mm/px · 1 of 35 slices shown (4 of 4)]
[im 1/35]
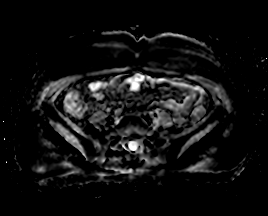

[Series 9: ax in and · axial · 3.0mm · 1.09mm/px · z∈[-177,+108]mm · 3 of 96 slices shown (1 of 2)]
[im 1/96]
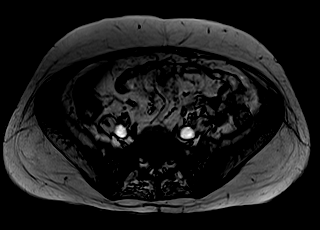
[im 48/96]
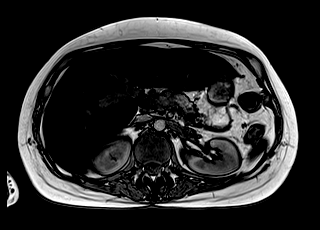
[im 96/96]
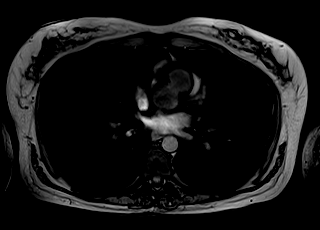

[Series 10: ax in and · axial · 3.0mm · 1.09mm/px · z∈[-177,+108]mm · 3 of 96 slices shown (2 of 2)]
[im 1/96]
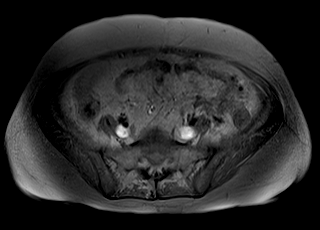
[im 48/96]
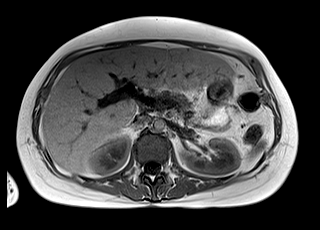
[im 96/96]
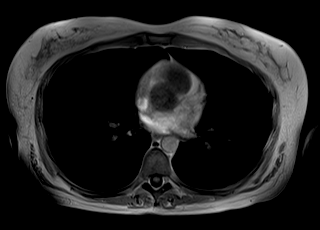

[Series 11: ax haste bh · axial · 6.0mm · 1.19mm/px · 1 of 35 slices shown (1 of 2)]
[im 1/35]
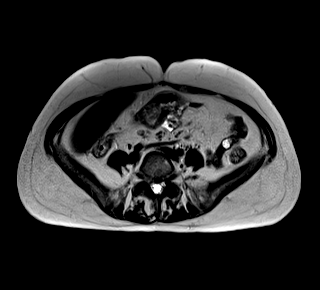

[Series 15: MRCP · coronal · 50.0mm · 0.78mm/px · 1 of 5 slices shown (1 of 2)]
[im 1/5]
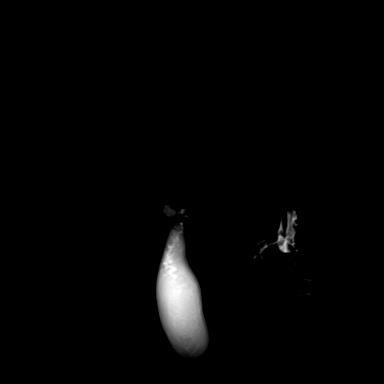

[Series 16: MRCP · coronal · 4.0mm · 1.12mm/px · 1 of 15 slices shown (2 of 2)]
[im 1/15]
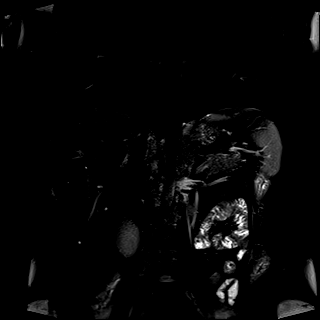

[Series 17: ax haste bh · axial · 6.0mm · 1.19mm/px · 1 of 40 slices shown (2 of 2)]
[im 1/40]
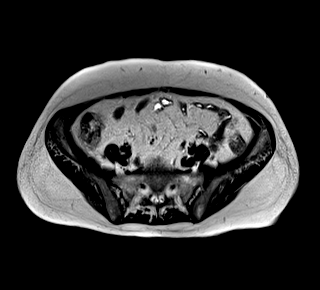

[Series 18: T1 dynamic · axial · 3.0mm · 1.09mm/px · z∈[-165,+96]mm · 3 of 88 slices shown (1 of 7)]
[im 1/88]
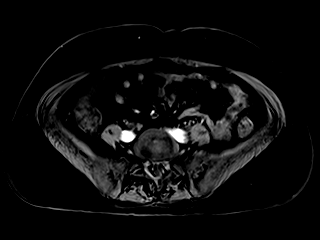
[im 44/88]
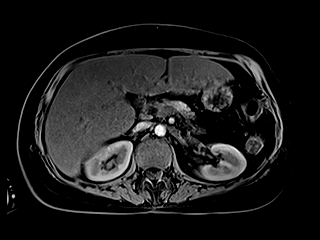
[im 88/88]
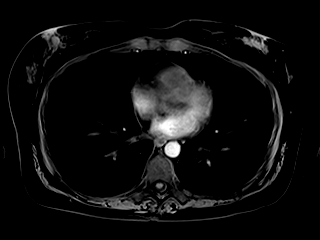

[Series 20: T1 dynamic · axial · 3.0mm · 1.09mm/px · z∈[-165,+96]mm · 3 of 88 slices shown (2 of 7)]
[im 1/88]
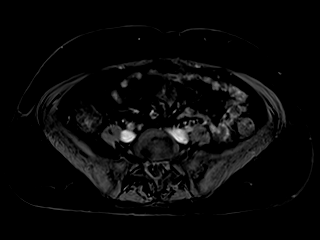
[im 44/88]
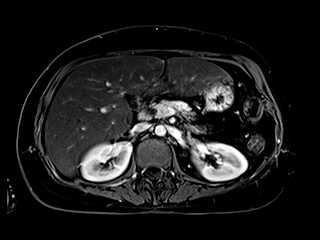
[im 88/88]
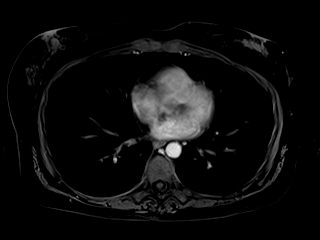

[Series 21: T1 dynamic · axial · 3.0mm · 1.09mm/px · z∈[-165,+96]mm · 3 of 88 slices shown (3 of 7)]
[im 1/88]
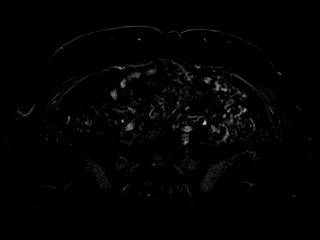
[im 44/88]
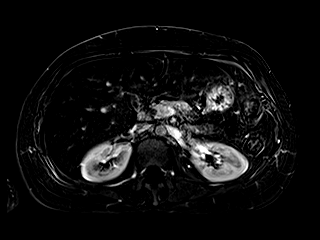
[im 88/88]
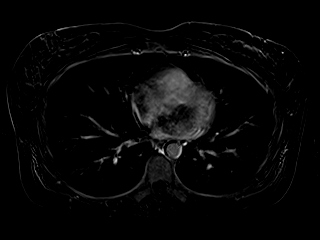

[Series 22: T1 dynamic · axial · 3.0mm · 1.09mm/px · z∈[-165,+96]mm · 3 of 88 slices shown (4 of 7)]
[im 1/88]
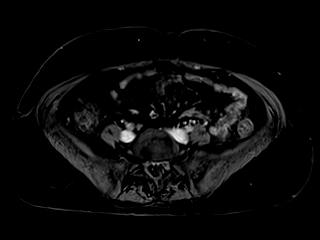
[im 44/88]
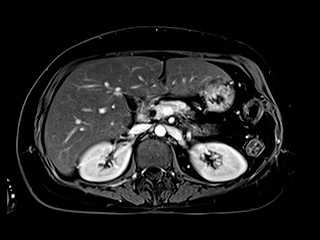
[im 88/88]
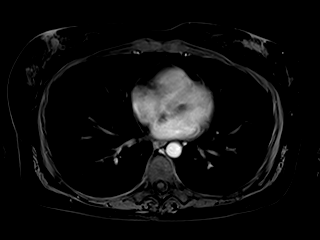

[Series 23: T1 dynamic · axial · 3.0mm · 1.09mm/px · z∈[-165,+96]mm · 3 of 88 slices shown (5 of 7)]
[im 1/88]
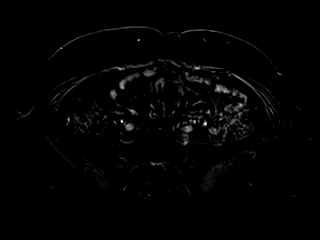
[im 44/88]
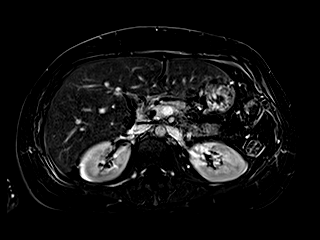
[im 88/88]
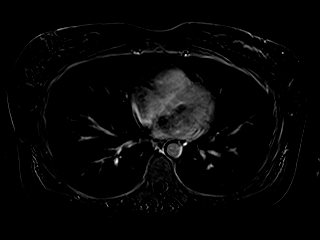

[Series 24: T1 dynamic · axial · 3.0mm · 1.09mm/px · z∈[-165,+96]mm · 3 of 88 slices shown (6 of 7)]
[im 1/88]
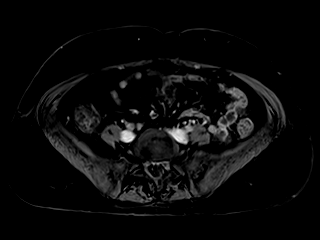
[im 44/88]
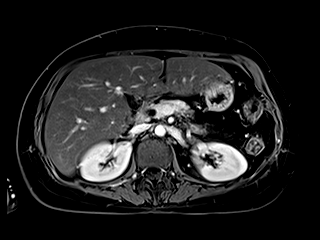
[im 88/88]
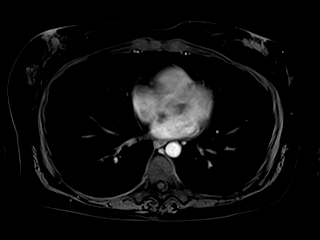

[Series 25: T1 dynamic · axial · 3.0mm · 1.09mm/px · z∈[-165,+96]mm · 3 of 88 slices shown (7 of 7)]
[im 1/88]
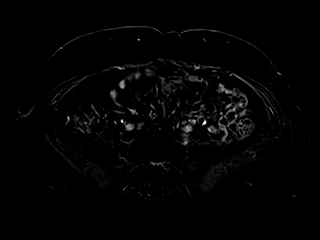
[im 44/88]
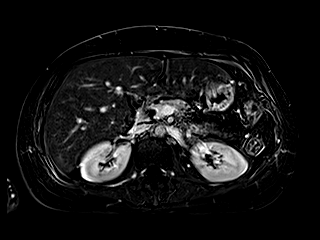
[im 88/88]
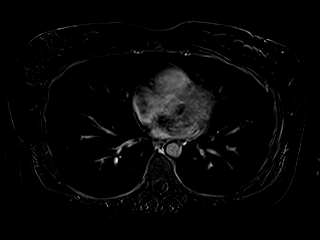

[Series 26: T1 dynamic post-contrast · coronal · 3.0mm · 1.31mm/px · 2 of 72 slices shown (1 of 3)]
[im 1/72]
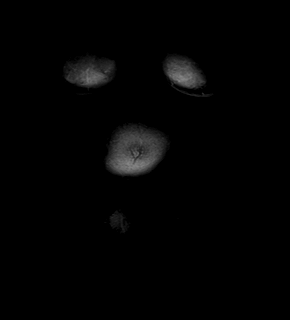
[im 72/72]
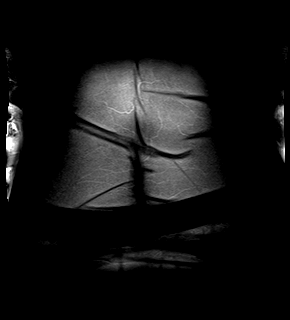

[Series 27: T1 dynamic post-contrast · axial · 3.0mm · 1.09mm/px · z∈[-165,+96]mm · 3 of 88 slices shown (2 of 3)]
[im 1/88]
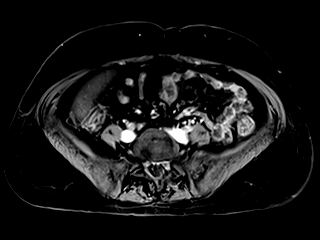
[im 44/88]
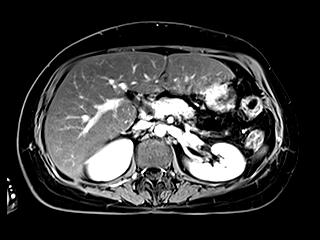
[im 88/88]
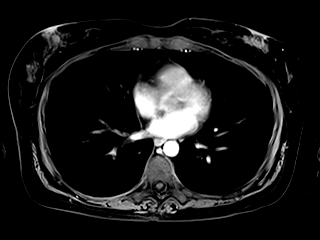

[Series 28: T1 dynamic post-contrast · axial · 3.0mm · 1.09mm/px · z∈[-165,+96]mm · 3 of 88 slices shown (3 of 3)]
[im 1/88]
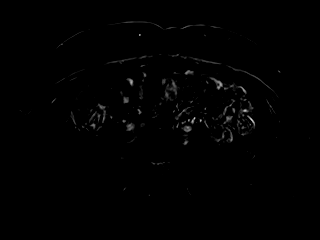
[im 44/88]
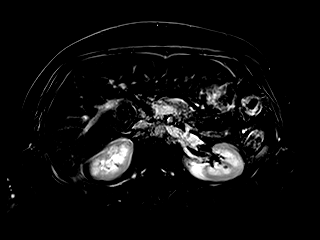
[im 88/88]
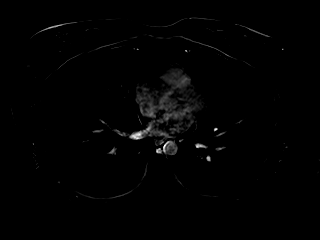

[45 of 48 positions shown; findings below may reference images not displayed]

FINDINGS: Lower chest: Within normal limits.

Hepatobiliary: Hepatomegaly measuring 22.6 cm in maximum
craniocaudal dimension at the midclavicular line. Severe diffuse
hepatic steatosis. Scattered bilobar subcentimeter hepatic cysts. No
suspicious hepatic lesion.

There is small volume of layering sludge in a distended gallbladder.
No gallbladder wall thickening or pericholecystic fluid. Prominence
of the central intra hepatic and extrahepatic bile ducts with the
common duct measuring 7 mm but with gentle tapering of the duct to
the level of the ampulla. No intraluminal filling defect or evidence
of extrinsic compression.

Pancreas: Intrinsic T1 signal of the pancreatic parenchyma is within
normal limits. No pancreatic ductal dilation or evidence of acute
inflammation. No pancreatic divisum or long common channel. No
suspicious pancreatic lesion.

Spleen:  Within normal limits.

Adrenals/Urinary Tract: Bilateral adrenal glands are unremarkable.
No hydronephrosis. No solid enhancing renal mass.

Stomach/Bowel: Visualized portions within the abdomen are
unremarkable.

Vascular/Lymphatic: No pathologically enlarged lymph nodes
identified. No abdominal aortic aneurysm demonstrated.

Other:  No abdominal ascites.

Musculoskeletal: No suspicious bone lesions identified.
IMPRESSION: 1. Prominence of the central intra hepatic and extrahepatic bile
ducts with the common duct measuring 7 mm but with gentle tapering
to the level of the ampulla. No intraluminal filling defect or
evidence of extrinsic compression.
2. Small volume of layering sludge in a distended gallbladder. No
gallbladder wall thickening or pericholecystic fluid.
3. Hepatomegaly and severe diffuse hepatic steatosis.

## 2022-08-07 DIAGNOSIS — F331 Major depressive disorder, recurrent, moderate: Secondary | ICD-10-CM | POA: Diagnosis not present

## 2022-08-07 DIAGNOSIS — F4389 Other reactions to severe stress: Secondary | ICD-10-CM | POA: Diagnosis not present

## 2022-08-14 DIAGNOSIS — F4389 Other reactions to severe stress: Secondary | ICD-10-CM | POA: Diagnosis not present

## 2022-08-14 DIAGNOSIS — F331 Major depressive disorder, recurrent, moderate: Secondary | ICD-10-CM | POA: Diagnosis not present

## 2022-08-21 DIAGNOSIS — F331 Major depressive disorder, recurrent, moderate: Secondary | ICD-10-CM | POA: Diagnosis not present

## 2022-08-21 DIAGNOSIS — F4389 Other reactions to severe stress: Secondary | ICD-10-CM | POA: Diagnosis not present

## 2022-09-17 ENCOUNTER — Encounter: Payer: Self-pay | Admitting: Nurse Practitioner

## 2022-09-17 ENCOUNTER — Ambulatory Visit: Payer: Medicaid Other | Admitting: Nurse Practitioner

## 2022-09-17 VITALS — BP 112/64 | Wt 117.0 lb

## 2022-09-17 DIAGNOSIS — R200 Anesthesia of skin: Secondary | ICD-10-CM | POA: Diagnosis not present

## 2022-09-17 DIAGNOSIS — R202 Paresthesia of skin: Secondary | ICD-10-CM | POA: Diagnosis not present

## 2022-09-17 DIAGNOSIS — D509 Iron deficiency anemia, unspecified: Secondary | ICD-10-CM

## 2022-09-17 NOTE — Progress Notes (Signed)
   Subjective:    Patient ID: Pamela [name], female    DOB: [birth year], 41 y.o.   MRN: [id number]  HPI 41 year old with history of depression and grief arrives due to leg swelling to bilateral legs and  numbness to hands and feet x1 year. Pt states she was told the swelling would go away after gallbladder surgery in November but has continued to persist. Patient states that swelling to feet and ankles are worse in the morning and gradually improve throughout the day. Pt also reports bruising to left upper thigh x2 days. Patient also complains of cold intolerance and chronic fatigue.   Patient denies any chest pain, SOB, or orthopnea, injuries to left leg.   Review of Systems  Constitutional:  Positive for fatigue.  Cardiovascular:  Positive for leg swelling.  Endocrine: Positive for cold intolerance.  Neurological:  Positive for numbness.  Hematological:  Bruises/bleeds easily.       Objective:   Physical Exam Vitals reviewed.  Constitutional:      General: She is not in acute distress.    Appearance: Normal appearance. She is normal weight. She is not ill-appearing, toxic-appearing or diaphoretic.  HENT:     Head: Normocephalic and atraumatic.  Cardiovascular:     Rate and Rhythm: Normal rate and regular rhythm.     Pulses: Normal pulses.     Heart sounds: Normal heart sounds. No murmur heard. Pulmonary:     Effort: Pulmonary effort is normal. No respiratory distress.     Breath sounds: Normal breath sounds. No wheezing.  Musculoskeletal:        General: Swelling present.     Comments: Grossly intact.  Mild swelling to bilateral ankles.   Skin:    General: Skin is warm.     Capillary Refill: Capillary refill takes less than 2 seconds.     Findings: Bruising present.     Comments: Multiple moderate sized bruises noted to left upper thigh  Neurological:     Mental Status: She is alert.     Comments: Grossly intact  Psychiatric:        Mood and Affect: Mood normal.         Behavior: Behavior normal.           Assessment & Plan:  1. Iron deficiency anemia, unspecified iron deficiency anemia type - Suspect anemia or iron deficiency or other blood disorder causing patient's symptoms.  - CMP14+EGFR - CBC with Differential/Platelet - Iron, TIBC and Ferritin Panel  2. Numbness and tingling - Suspect Folate deficiency (patient has history of ETOH use) - B12 and Folate Panel  RTC in 3-4 weeks.

## 2022-09-18 LAB — CMP14+EGFR
ALT: 32 IU/L (ref 0–32)
AST: 105 IU/L — ABNORMAL HIGH (ref 0–40)
Albumin/Globulin Ratio: 1 — ABNORMAL LOW (ref 1.2–2.2)
Albumin: 3.3 g/dL — ABNORMAL LOW (ref 3.9–4.9)
Alkaline Phosphatase: 272 IU/L — ABNORMAL HIGH (ref 44–121)
BUN/Creatinine Ratio: 6 — ABNORMAL LOW (ref 9–23)
BUN: 3 mg/dL — ABNORMAL LOW (ref 6–24)
Bilirubin Total: 0.5 mg/dL (ref 0.0–1.2)
CO2: 26 mmol/L (ref 20–29)
Calcium: 8.7 mg/dL (ref 8.7–10.2)
Chloride: 97 mmol/L (ref 96–106)
Creatinine, Ser: 0.5 mg/dL — ABNORMAL LOW (ref 0.57–1.00)
Globulin, Total: 3.2 g/dL (ref 1.5–4.5)
Glucose: 107 mg/dL — ABNORMAL HIGH (ref 70–99)
Potassium: 3.3 mmol/L — ABNORMAL LOW (ref 3.5–5.2)
Sodium: 138 mmol/L (ref 134–144)
Total Protein: 6.5 g/dL (ref 6.0–8.5)
eGFR: 120 mL/min/{1.73_m2} (ref >59)

## 2022-09-18 LAB — CBC WITH DIFFERENTIAL/PLATELET
Basophils Absolute: 0.1 10*3/uL (ref 0.0–0.2)
Basos: 1 %
EOS (ABSOLUTE): 0.2 10*3/uL (ref 0.0–0.4)
Eos: 3 %
Hematocrit: 38.3 % (ref 34.0–46.6)
Hemoglobin: 12.7 g/dL (ref 11.1–15.9)
Immature Grans (Abs): 0 10*3/uL (ref 0.0–0.1)
Immature Granulocytes: 0 %
Lymphocytes Absolute: 2.8 10*3/uL (ref 0.7–3.1)
Lymphs: 38 %
MCH: 31.1 pg (ref 26.6–33.0)
MCHC: 33.2 g/dL (ref 31.5–35.7)
MCV: 94 fL (ref 79–97)
Monocytes Absolute: 0.5 10*3/uL (ref 0.1–0.9)
Monocytes: 6 %
Neutrophils Absolute: 3.8 10*3/uL (ref 1.4–7.0)
Neutrophils: 52 %
Platelets: 205 10*3/uL (ref 150–450)
RBC: 4.09 x10E6/uL (ref 3.77–5.28)
RDW: 14.4 % (ref 11.7–15.4)
WBC: 7.4 10*3/uL (ref 3.4–10.8)

## 2022-09-18 LAB — IRON,TIBC AND FERRITIN PANEL
Ferritin: 75 ng/mL (ref 15–150)
Iron Saturation: 10 % — ABNORMAL LOW (ref 15–55)
Iron: 34 ug/dL (ref 27–159)
Total Iron Binding Capacity: 350 ug/dL (ref 250–450)
UIBC: 316 ug/dL (ref 131–425)

## 2022-09-18 LAB — B12 AND FOLATE PANEL
Folate: 2.4 ng/mL — ABNORMAL LOW (ref >3.0)
Vitamin B-12: 269 pg/mL (ref 232–1245)

## 2022-09-21 ENCOUNTER — Other Ambulatory Visit: Payer: Self-pay | Admitting: Nurse Practitioner

## 2022-09-21 DIAGNOSIS — E876 Hypokalemia: Secondary | ICD-10-CM

## 2022-09-21 DIAGNOSIS — E538 Deficiency of other specified B group vitamins: Secondary | ICD-10-CM

## 2022-09-21 DIAGNOSIS — E611 Iron deficiency: Secondary | ICD-10-CM

## 2022-09-21 MED ORDER — FOLIC ACID 1 MG PO TABS
1.0000 mg | ORAL_TABLET | Freq: Every day | ORAL | 0 refills | Status: DC
Start: 2022-09-21 — End: 2023-05-26

## 2022-09-21 MED ORDER — IRON (FERROUS SULFATE) 325 (65 FE) MG PO TABS: 325.0000 mg | ORAL_TABLET | Freq: Every day | ORAL | 1 refills | Status: DC

## 2022-09-21 MED ORDER — POTASSIUM CHLORIDE CRYS ER 10 MEQ PO TBCR: 10.0000 meq | EXTENDED_RELEASE_TABLET | Freq: Every day | ORAL | 0 refills | Status: DC

## 2022-09-25 ENCOUNTER — Encounter: Payer: Self-pay | Admitting: Nurse Practitioner

## 2023-05-17 ENCOUNTER — Telehealth: Payer: Self-pay

## 2023-05-17 NOTE — Transitions of Care (Post Inpatient/ED Visit) (Unsigned)
   05/17/2023  Name: Pamela [name] MRN: [id number] DOB: February 08, 1981  Today's TOC FU Call Status: Today's TOC FU Call Status:: Successful TOC FU Call Competed TOC FU Call Complete Date: 05/17/23  Transition Care Management Follow-up Telephone Call Date of Discharge: 05/14/23 Discharge Facility: Other ([facility] Facility) Name of Other ([facility]) Discharge Facility: [facility] Type of Discharge: Emergency Department Reason for ED Visit: Other: (abd pain) How have you been since you were released from the hospital?: Better Any questions or concerns?: No  Items Reviewed: Did you receive and understand the discharge instructions provided?: Yes Medications obtained,verified, and reconciled?: Yes (Medications Reviewed) Any new allergies since your discharge?: Yes Dietary orders reviewed?: Yes Do you have support at home?: Yes People in Home: spouse  Medications Reviewed Today: Medications Reviewed Today     Reviewed by Karena Addison, LPN (Licensed Practical Nurse) on 05/17/23 at 1606  Med List Status: <None>   Medication Order Taking? Sig Documenting Provider Last Dose Status Informant  aspirin EC 81 MG tablet [id number] No Take 81 mg by mouth daily. Swallow whole. [provider] Taking Active Self  folic acid (FOLVITE) 1 MG tablet [id number]  Take 1 tablet (1 mg total) by mouth daily. [employer], Alvino Chapel, FNP  Active   gabapentin (NEURONTIN) 100 MG capsule [id number] No Take 100 mg by mouth 3 (three) times daily. [provider] Taking Active Self  Iron, Ferrous Sulfate, 325 (65 Fe) MG TABS [id number]  Take 325 mg by mouth daily. [employer], Alvino Chapel, FNP  Active   potassium chloride (KLOR-CON M) 10 MEQ tablet [id number]  Take 1 tablet (10 mEq total) by mouth daily. [employer], Alvino Chapel, FNP  Active             Home Care and Equipment/Supplies: Were Home Health Services Ordered?: NA Any new equipment or medical supplies ordered?: NA  Functional Questionnaire: Do you  need assistance with bathing/showering or dressing?: No Do you need assistance with meal preparation?: No Do you need assistance with eating?: No Do you have difficulty maintaining continence: No Do you need assistance with getting out of bed/getting out of a chair/moving?: No Do you have difficulty managing or taking your medications?: No  Follow up appointments reviewed: PCP Follow-up appointment confirmed?: No (no appt avail, sent message to staff to schedule) Specialist Hospital Follow-up appointment confirmed?: NA Do you need transportation to your follow-up appointment?: No Do you understand care options if your condition(s) worsen?: Yes-patient verbalized understanding    SIGNATURE Karena Addison, LPN [facility] Nurse Health Advisor Direct Dial [phone]

## 2023-05-25 ENCOUNTER — Other Ambulatory Visit: Payer: Self-pay

## 2023-05-25 ENCOUNTER — Inpatient Hospital Stay (HOSPITAL_COMMUNITY)
Admission: EM | Admit: 2023-05-25 | Discharge: 2023-05-29 | DRG: 433 | Disposition: A | Discharge status: Home / Self Care | Payer: Medicaid Other | Attending: Internal Medicine | Admitting: Internal Medicine

## 2023-05-25 ENCOUNTER — Emergency Department (HOSPITAL_COMMUNITY): Payer: Medicaid Other

## 2023-05-25 ENCOUNTER — Encounter (HOSPITAL_COMMUNITY): Payer: Self-pay | Admitting: Emergency Medicine

## 2023-05-25 DIAGNOSIS — Z888 Allergy status to other drugs, medicaments and biological substances status: Secondary | ICD-10-CM

## 2023-05-25 DIAGNOSIS — Y904 Blood alcohol level of 80-99 mg/100 ml: Secondary | ICD-10-CM | POA: Diagnosis present

## 2023-05-25 DIAGNOSIS — R188 Other ascites: Secondary | ICD-10-CM | POA: Diagnosis not present

## 2023-05-25 DIAGNOSIS — D539 Nutritional anemia, unspecified: Secondary | ICD-10-CM

## 2023-05-25 DIAGNOSIS — K915 Postcholecystectomy syndrome: Secondary | ICD-10-CM | POA: Diagnosis present

## 2023-05-25 DIAGNOSIS — R1084 Generalized abdominal pain: Secondary | ICD-10-CM | POA: Diagnosis not present

## 2023-05-25 DIAGNOSIS — Y839 Surgical procedure, unspecified as the cause of abnormal reaction of the patient, or of later complication, without mention of misadventure at the time of the procedure: Secondary | ICD-10-CM | POA: Diagnosis present

## 2023-05-25 DIAGNOSIS — Z5986 Financial insecurity: Secondary | ICD-10-CM

## 2023-05-25 DIAGNOSIS — I8510 Secondary esophageal varices without bleeding: Secondary | ICD-10-CM | POA: Diagnosis present

## 2023-05-25 DIAGNOSIS — E8809 Other disorders of plasma-protein metabolism, not elsewhere classified: Secondary | ICD-10-CM | POA: Diagnosis present

## 2023-05-25 DIAGNOSIS — K7200 Acute and subacute hepatic failure without coma: Principal | ICD-10-CM

## 2023-05-25 DIAGNOSIS — D509 Iron deficiency anemia, unspecified: Secondary | ICD-10-CM | POA: Diagnosis present

## 2023-05-25 DIAGNOSIS — R7401 Elevation of levels of liver transaminase levels: Secondary | ICD-10-CM | POA: Insufficient documentation

## 2023-05-25 DIAGNOSIS — E860 Dehydration: Secondary | ICD-10-CM | POA: Diagnosis present

## 2023-05-25 DIAGNOSIS — N3000 Acute cystitis without hematuria: Secondary | ICD-10-CM | POA: Diagnosis present

## 2023-05-25 DIAGNOSIS — K7460 Unspecified cirrhosis of liver: Secondary | ICD-10-CM

## 2023-05-25 DIAGNOSIS — K7040 Alcoholic hepatic failure without coma: Secondary | ICD-10-CM | POA: Diagnosis present

## 2023-05-25 DIAGNOSIS — E611 Iron deficiency: Secondary | ICD-10-CM

## 2023-05-25 DIAGNOSIS — E876 Hypokalemia: Secondary | ICD-10-CM | POA: Diagnosis present

## 2023-05-25 DIAGNOSIS — F17210 Nicotine dependence, cigarettes, uncomplicated: Secondary | ICD-10-CM | POA: Diagnosis present

## 2023-05-25 DIAGNOSIS — K766 Portal hypertension: Secondary | ICD-10-CM | POA: Diagnosis present

## 2023-05-25 DIAGNOSIS — K7011 Alcoholic hepatitis with ascites: Secondary | ICD-10-CM | POA: Diagnosis not present

## 2023-05-25 DIAGNOSIS — Z634 Disappearance and death of family member: Secondary | ICD-10-CM | POA: Diagnosis not present

## 2023-05-25 DIAGNOSIS — K7031 Alcoholic cirrhosis of liver with ascites: Secondary | ICD-10-CM | POA: Diagnosis not present

## 2023-05-25 DIAGNOSIS — R109 Unspecified abdominal pain: Secondary | ICD-10-CM | POA: Diagnosis present

## 2023-05-25 DIAGNOSIS — F1010 Alcohol abuse, uncomplicated: Secondary | ICD-10-CM | POA: Diagnosis present

## 2023-05-25 DIAGNOSIS — E538 Deficiency of other specified B group vitamins: Secondary | ICD-10-CM | POA: Diagnosis present

## 2023-05-25 DIAGNOSIS — Z79899 Other long term (current) drug therapy: Secondary | ICD-10-CM

## 2023-05-25 DIAGNOSIS — K760 Fatty (change of) liver, not elsewhere classified: Secondary | ICD-10-CM | POA: Diagnosis present

## 2023-05-25 DIAGNOSIS — B9620 Unspecified Escherichia coli [E. coli] as the cause of diseases classified elsewhere: Secondary | ICD-10-CM | POA: Diagnosis present

## 2023-05-25 DIAGNOSIS — E871 Hypo-osmolality and hyponatremia: Secondary | ICD-10-CM | POA: Insufficient documentation

## 2023-05-25 DIAGNOSIS — Z7982 Long term (current) use of aspirin: Secondary | ICD-10-CM

## 2023-05-25 DIAGNOSIS — I959 Hypotension, unspecified: Secondary | ICD-10-CM | POA: Diagnosis not present

## 2023-05-25 DIAGNOSIS — R112 Nausea with vomiting, unspecified: Secondary | ICD-10-CM | POA: Insufficient documentation

## 2023-05-25 DIAGNOSIS — K449 Diaphragmatic hernia without obstruction or gangrene: Secondary | ICD-10-CM | POA: Diagnosis present

## 2023-05-25 DIAGNOSIS — D649 Anemia, unspecified: Secondary | ICD-10-CM | POA: Diagnosis not present

## 2023-05-25 LAB — HEPATITIS PANEL, ACUTE
HCV Ab: NONREACTIVE
Hep A IgM: NONREACTIVE
Hep B C IgM: NONREACTIVE
Hepatitis B Surface Ag: NONREACTIVE

## 2023-05-25 LAB — COMPREHENSIVE METABOLIC PANEL
ALT: 31 U/L (ref 0–44)
AST: 118 U/L — ABNORMAL HIGH (ref 15–41)
Albumin: 2.2 g/dL — ABNORMAL LOW (ref 3.5–5.0)
Alkaline Phosphatase: 139 U/L — ABNORMAL HIGH (ref 38–126)
Anion gap: 10 (ref 5–15)
BUN: <5 mg/dL — ABNORMAL LOW (ref 6–20)
CO2: 30 mmol/L (ref 22–32)
Calcium: 7.6 mg/dL — ABNORMAL LOW (ref 8.9–10.3)
Chloride: 88 mmol/L — ABNORMAL LOW (ref 98–111)
Creatinine, Ser: 0.37 mg/dL — ABNORMAL LOW (ref 0.44–1.00)
GFR, Estimated: >60 mL/min (ref >60)
Glucose, Bld: 103 mg/dL — ABNORMAL HIGH (ref 70–99)
Potassium: 2.7 mmol/L — CL (ref 3.5–5.1)
Sodium: 128 mmol/L — ABNORMAL LOW (ref 135–145)
Total Bilirubin: 16.2 mg/dL — ABNORMAL HIGH (ref 0.3–1.2)
Total Protein: 6.3 g/dL — ABNORMAL LOW (ref 6.5–8.1)

## 2023-05-25 LAB — PROTIME-INR
INR: 2.2 — ABNORMAL HIGH (ref 0.8–1.2)
Prothrombin Time: 24.7 seconds — ABNORMAL HIGH (ref 11.4–15.2)

## 2023-05-25 LAB — URINALYSIS, ROUTINE W REFLEX MICROSCOPIC
Glucose, UA: 50 mg/dL — AB
Hgb urine dipstick: NEGATIVE
Ketones, ur: NEGATIVE mg/dL
Leukocytes,Ua: NEGATIVE
Nitrite: NEGATIVE
Protein, ur: 30 mg/dL — AB
Specific Gravity, Urine: 1.02 (ref 1.005–1.030)
pH: 5 (ref 5.0–8.0)

## 2023-05-25 LAB — ACETAMINOPHEN LEVEL: Acetaminophen (Tylenol), Serum: <10 ug/mL — ABNORMAL LOW (ref 10–30)

## 2023-05-25 LAB — CBC
HCT: 32.3 % — ABNORMAL LOW (ref 36.0–46.0)
Hemoglobin: 11.4 g/dL — ABNORMAL LOW (ref 12.0–15.0)
MCH: 38.6 pg — ABNORMAL HIGH (ref 26.0–34.0)
MCHC: 35.3 g/dL (ref 30.0–36.0)
MCV: 109.5 fL — ABNORMAL HIGH (ref 80.0–100.0)
Platelets: 187 10*3/uL (ref 150–400)
RBC: 2.95 MIL/uL — ABNORMAL LOW (ref 3.87–5.11)
RDW: 18.9 % — ABNORMAL HIGH (ref 11.5–15.5)
WBC: 19.5 10*3/uL — ABNORMAL HIGH (ref 4.0–10.5)
nRBC: 0.1 % (ref 0.0–0.2)

## 2023-05-25 LAB — ETHANOL: Alcohol, Ethyl (B): <10 mg/dL (ref <10)

## 2023-05-25 LAB — AMMONIA: Ammonia: 35 umol/L (ref 9–35)

## 2023-05-25 LAB — LIPASE, BLOOD: Lipase: 29 U/L (ref 11–51)

## 2023-05-25 MED ORDER — FENTANYL CITRATE PF 50 MCG/ML IJ SOSY
50.0000 ug | PREFILLED_SYRINGE | Freq: Once | INTRAMUSCULAR | Status: AC
  Administered 2023-05-25: 50 ug via INTRAVENOUS
  Filled 2023-05-25: qty 1

## 2023-05-25 MED ORDER — HYDROMORPHONE HCL 1 MG/ML IJ SOLN
0.5000 mg | INTRAMUSCULAR | Status: DC | PRN
  Administered 2023-05-25 – 2023-05-29 (×24): 0.5 mg via INTRAVENOUS
  Filled 2023-05-25 (×24): qty 0.5

## 2023-05-25 MED ORDER — SODIUM CHLORIDE 0.9 % IV SOLN: INTRAVENOUS | Status: AC

## 2023-05-25 MED ORDER — SODIUM CHLORIDE 0.9 % IV SOLN
1.0000 g | Freq: Once | INTRAVENOUS | Status: AC
  Administered 2023-05-25: 1 g via INTRAVENOUS
  Filled 2023-05-25: qty 10

## 2023-05-25 MED ORDER — POTASSIUM CHLORIDE 10 MEQ/100ML IV SOLN
10.0000 meq | INTRAVENOUS | Status: AC
  Administered 2023-05-25 (×3): 10 meq via INTRAVENOUS
  Filled 2023-05-25 (×3): qty 100

## 2023-05-25 MED ORDER — ALBUMIN HUMAN 25 % IV SOLN: 25.0000 g | Freq: Once | INTRAVENOUS | Status: DC

## 2023-05-25 MED ORDER — ONDANSETRON HCL 4 MG/2ML IJ SOLN
4.0000 mg | Freq: Four times a day (QID) | INTRAMUSCULAR | Status: DC | PRN
  Administered 2023-05-26 – 2023-05-27 (×2): 4 mg via INTRAVENOUS
  Filled 2023-05-25 (×2): qty 2

## 2023-05-25 MED ORDER — FERROUS SULFATE 325 (65 FE) MG PO TABS
325.0000 mg | ORAL_TABLET | Freq: Every day | ORAL | Status: DC
  Administered 2023-05-26 – 2023-05-29 (×4): 325 mg via ORAL
  Filled 2023-05-25 (×4): qty 1

## 2023-05-25 MED ORDER — ONDANSETRON HCL 4 MG PO TABS
4.0000 mg | ORAL_TABLET | Freq: Four times a day (QID) | ORAL | Status: DC | PRN
  Administered 2023-05-26 – 2023-05-29 (×2): 4 mg via ORAL
  Filled 2023-05-25 (×2): qty 1

## 2023-05-25 MED ORDER — ONDANSETRON HCL 4 MG/2ML IJ SOLN
4.0000 mg | Freq: Once | INTRAMUSCULAR | Status: AC
  Administered 2023-05-25: 4 mg via INTRAVENOUS
  Filled 2023-05-25: qty 2

## 2023-05-25 MED ORDER — ONDANSETRON HCL 4 MG/2ML IJ SOLN
4.0000 mg | Freq: Once | INTRAMUSCULAR | Status: AC | PRN
  Administered 2023-05-25: 4 mg via INTRAVENOUS
  Filled 2023-05-25: qty 2

## 2023-05-25 NOTE — ED Triage Notes (Signed)
Pt c/o ascites, jaundice, blood in urine, nausea, diarrhea since she had her gallbladder removed a year and a half ago. No known hepatic dx per pt report. Estimates 7 episodes of emesis and 4 of diarrhea today so far. Pt's skin and eyes are yellow-tinged and she appears unwell.

## 2023-05-25 NOTE — ED Notes (Signed)
ED TO INPATIENT HANDOFF REPORT  ED Nurse Name and Phone #: Jacques Earthly Name/Age/Gender Pamela [name] 42 y.o. female Room/Bed: APA02/APA02  Code Status   Code Status: Prior  Home/SNF/Other Home Patient oriented to: self, place, time, and situation Is this baseline? Yes   Triage Complete: Triage complete  Chief Complaint Abdominal pain [R10.9]  Triage Note Pt c/o ascites, jaundice, blood in urine, nausea, diarrhea since she had her gallbladder removed a year and a half ago. No known hepatic dx per pt report. Estimates 7 episodes of emesis and 4 of diarrhea today so far. Pt's skin and eyes are yellow-tinged and she appears unwell.    Allergies Allergies  Allergen Reactions   Zomig [Zolmitriptan] Shortness Of Breath and Swelling    Imitrex is tolerated well    Level of Care/Admitting Diagnosis ED Disposition     ED Disposition  Admit   Condition  --   Comment  Hospital Area: [facility] [[id number]]  Level of Care: Med-Surg [16]  Covid Evaluation: Asymptomatic - no recent exposure (last 10 days) testing not required  Diagnosis: Abdominal pain [744753]  Admitting Physician: Frankey Shown [[id number]]  Attending Physician: Frankey Shown [[id number]]  Certification:: I certify this patient will need inpatient services for at least 2 midnights  Estimated Length of Stay: 3          B Medical/Surgery History Past Medical History:  Diagnosis Date   Abnormal vaginal Pap smear    ASCUS of cervix with negative high risk HPV 09/01/2021   Repeat papin 3 years, per [facility], 5 year risk for CIN 3+ is 0.40%   Depression    Elevated liver enzymes 08/01/2021   Stop alcohol, recheck at appt, get [place] 9/1   Migraines    Neuropathy    Pregnant 06/19/2015   Sludge in gallbladder 09/01/2021   Refer to Dr Lovell Sheehan   Vaginal Pap smear, abnormal    Past Surgical History:  Procedure Laterality Date   BACK SURGERY     CESAREAN SECTION  x 2   CESAREAN SECTION WITH BILATERAL  TUBAL LIGATION Bilateral 02/11/2016   Procedure: REPEAT CESAREAN SECTION WITH BILATERAL TUBAL LIGATION;  Surgeon: Tilda Burrow, MD;  Location: [facility];  Service: Obstetrics;  Laterality: Bilateral;   CHOLECYSTECTOMY N/A 10/22/2021   Procedure: LAPAROSCOPIC CHOLECYSTECTOMY;  Surgeon: Franky Macho, MD;  Location: [facility];  Service: General;  Laterality: N/A;   TONSILLECTOMY AND ADENOIDECTOMY       A IV Location/Drains/Wounds Patient Lines/Drains/Airways Status     Active Line/Drains/Airways     Name Placement date Placement time Site Days   Peripheral IV 05/25/23 20 G Left Antecubital 05/25/23  1704  Antecubital  less than 1   Incision (Closed) 10/22/21 Abdomen 10/22/21  1332  -- 580   Incision - 4 Ports Abdomen Umbilicus Medial;Upper Upper;Lateral;Right Lateral;Right;Lower 10/22/21  1328  -- 580            Intake/Output Last 24 hours  Intake/Output Summary (Last 24 hours) at 05/25/2023 2104 Last data filed at 05/25/2023 1949 Gross per 24 hour  Intake 100 ml  Output --  Net 100 ml    Labs/Imaging Results for orders placed or performed during the hospital encounter of 05/25/23 (from the past 48 hour(s))  Lipase, blood     Status: None   Collection Time: 05/25/23  3:41 PM  Result Value Ref Range   Lipase 29 11 - 51 U/L    Comment: Performed at [facility], [address] [place], [place] [address]  Comprehensive metabolic panel     Status: Abnormal   Collection Time: 05/25/23  3:41 PM  Result Value Ref Range   Sodium 128 (L) 135 - 145 mmol/L   Potassium 2.7 (LL) 3.5 - 5.1 mmol/L    Comment: CRITICAL RESULT CALLED TO, READ BACK BY AND VERIFIED WITH DANNER,A ON 05/25/23 AT 1715 BY LOY,C   Chloride 88 (L) 98 - 111 mmol/L   CO2 30 22 - 32 mmol/L   Glucose, Bld 103 (H) 70 - 99 mg/dL    Comment: Glucose reference range applies only to samples taken after fasting for at least 8 hours.   BUN <5 (L) 6 - 20 mg/dL   Creatinine, Ser 6.04 (L) 0.44 - 1.00 mg/dL   Calcium 7.6 (L) 8.9  - 10.3 mg/dL   Total Protein 6.3 (L) 6.5 - 8.1 g/dL   Albumin 2.2 (L) 3.5 - 5.0 g/dL   AST 540 (H) 15 - 41 U/L   ALT 31 0 - 44 U/L   Alkaline Phosphatase 139 (H) 38 - 126 U/L   Total Bilirubin 16.2 (H) 0.3 - 1.2 mg/dL   GFR, Estimated >98 >11 mL/min    Comment: (NOTE) Calculated using the CKD-EPI Creatinine Equation (2021)    Anion gap 10 5 - 15    Comment: Performed at [facility], [address], [place], [place] [address]  CBC     Status: Abnormal   Collection Time: 05/25/23  3:41 PM  Result Value Ref Range   WBC 19.5 (H) 4.0 - 10.5 K/uL   RBC 2.95 (L) 3.87 - 5.11 MIL/uL   Hemoglobin 11.4 (L) 12.0 - 15.0 g/dL   HCT 29.5 (L) 62.1 - 30.8 %   MCV 109.5 (H) 80.0 - 100.0 fL   MCH 38.6 (H) 26.0 - 34.0 pg   MCHC 35.3 30.0 - 36.0 g/dL   RDW 65.7 (H) 84.6 - 96.2 %   Platelets 187 150 - 400 K/uL   nRBC 0.1 0.0 - 0.2 %    Comment: Performed at [facility], [address], [place], [place] [address]  Ammonia     Status: None   Collection Time: 05/25/23  3:41 PM  Result Value Ref Range   Ammonia 35 9 - 35 umol/L    Comment: Performed at [facility], [address], [place], [place] [address]  Protime-INR     Status: Abnormal   Collection Time: 05/25/23  3:41 PM  Result Value Ref Range   Prothrombin Time 24.7 (H) 11.4 - 15.2 seconds   INR 2.2 (H) 0.8 - 1.2    Comment: (NOTE) INR goal varies based on device and disease states. Performed at [facility], [address], [place], [place] [address]   Ethanol     Status: None   Collection Time: 05/25/23  3:41 PM  Result Value Ref Range   Alcohol, Ethyl (B) <10 <10 mg/dL    Comment: (NOTE) Lowest detectable limit for serum alcohol is 10 mg/dL.  For medical purposes only. Performed at [facility], [address] [place], [place] [address]   Acetaminophen level     Status: Abnormal   Collection Time: 05/25/23  3:41 PM  Result Value Ref Range   Acetaminophen (Tylenol), Serum <10 (L) 10 - 30 ug/mL    Comment: (NOTE) Therapeutic  concentrations vary significantly. A range of 10-30 ug/mL  may be an effective concentration for many patients. However, some  are best treated at concentrations outside of this  range. Acetaminophen concentrations >150 ug/mL at 4 hours after ingestion  and >50 ug/mL at 12 hours after ingestion are often associated with  toxic reactions.  Performed at [facility], [address] [place], [place] [address]   Urinalysis, Routine w reflex microscopic -Urine, Clean Catch     Status: Abnormal   Collection Time: 05/25/23  5:07 PM  Result Value Ref Range   Color, Urine AMBER (A) YELLOW    Comment: BIOCHEMICALS MAY BE AFFECTED BY COLOR   APPearance HAZY (A) CLEAR   Specific Gravity, Urine 1.020 1.005 - 1.030   pH 5.0 5.0 - 8.0   Glucose, UA 50 (A) NEGATIVE mg/dL   Hgb urine dipstick NEGATIVE NEGATIVE   Bilirubin Urine MODERATE (A) NEGATIVE   Ketones, ur NEGATIVE NEGATIVE mg/dL   Protein, ur 30 (A) NEGATIVE mg/dL   Nitrite NEGATIVE NEGATIVE   Leukocytes,Ua NEGATIVE NEGATIVE   RBC / HPF 0-5 0 - 5 RBC/hpf   WBC, UA 21-50 0 - 5 WBC/hpf   Bacteria, UA RARE (A) NONE SEEN   Squamous Epithelial / HPF 6-10 0 - 5 /HPF   WBC Clumps PRESENT    Mucus PRESENT    Non Squamous Epithelial 0-5 (A) NONE SEEN    Comment: Performed at [facility], [address] [place], [place] [address]   US ABDOMEN LIMITED RUQ (LIVER/GB)  Result Date: 05/25/2023 CLINICAL DATA:  Abdominal pain EXAM: ULTRASOUND ABDOMEN LIMITED RIGHT UPPER QUADRANT COMPARISON:  None Available. FINDINGS: Gallbladder: Surgically removed. Common bile duct: Diameter: 8.2 mm. Liver: Mild nodularity is noted. Increased echogenicity is noted. Portal vein shows no significant flow consistent with occlusion. Other: None. IMPRESSION: Status post cholecystectomy. Cirrhotic changes of the liver with nodularity. The portal vein shows no significant flow within. This is of uncertain chronicity. CT of the abdomen with contrast (with delayed venous phase  images) would be helpful for further evaluation. Electronically Signed   By: Alcide Clever M.D.   On: 05/25/2023 20:59    Pending Labs Unresulted Labs (From admission, onward)     Start     Ordered   05/25/23 1839  Urine Culture  Once,   URGENT       Question:  Indication  Answer:  Dysuria   05/25/23 1838   05/25/23 1629  Hepatitis panel, acute  Once,   URGENT        05/25/23 1628            Vitals/Pain Today's Vitals   05/25/23 1945 05/25/23 2000 05/25/23 2015 05/25/23 2030  BP: (!) 106/59 (!) 97/56 (!) 100/57 (!) 101/54  Pulse: 85 79 80 79  Resp: 15 14 16 19   Temp:      TempSrc:      SpO2: 94% 95% 94% 93%  Weight:      Height:      PainSc:    4     Isolation Precautions No active isolations  Medications Medications  potassium chloride 10 mEq in 100 mL IVPB (10 mEq Intravenous New Bag/Given 05/25/23 2102)  0.9 %  sodium chloride infusion ( Intravenous New Bag/Given 05/25/23 1952)  ondansetron (ZOFRAN) injection 4 mg (4 mg Intravenous Given 05/25/23 1710)  cefTRIAXone (ROCEPHIN) 1 g in sodium chloride 0.9 % 100 mL IVPB (0 g Intravenous Stopped 05/25/23 1949)  ondansetron (ZOFRAN) injection 4 mg (4 mg Intravenous Given 05/25/23 1949)  fentaNYL (SUBLIMAZE) injection 50 mcg (50 mcg Intravenous Given 05/25/23 1950)    Mobility walks     Focused Assessments  R Recommendations: See Admitting Provider Note  Report given to:   Additional Notes: A&O; Ambu; 20G LAC

## 2023-05-25 NOTE — H&P (Signed)
History and Physical    PatientKeli [name] ZOX:[id number] DOB: [birth year] DOA: 05/25/2023 DOS: the patient was seen and examined on 05/25/2023 PCP: Babs Sciara, MD  Patient coming from: Home  Chief Complaint:  Chief Complaint  Patient presents with   Ascites   HPI: Pamela [name] is a 42 y.o. female with medical history significant of occasional tobacco use and prior alcohol abuse who presents to the emergency department due to 2-week onset of abdominal pain.  Patient complained of generalized abdominal pain which is worse in right upper quadrant, pain was rated as 6/10, it was intermittent also associated with nausea, vomiting and difficulty in being able to hold anything down without vomiting.  Patient also complained of abdominal bloating which also has been progressively worsening since onset of symptoms about 2 weeks ago.  She endorsed chronic diarrhea which started shortly after her gallbladder removal operation. She was recently seen at [facility] ED on 05/13/2023.  CT scan of her abdomen and pelvis done at that time per ED medical record showed severe hepatomegaly with fatty infiltration of the liver, esophageal and abdominal varices, ascites and mesenteric infiltration with edema per ED medical record.  She was diagnosed with UTI due to urine culture being positive for greater than 100,000 units of E. coli.  She was started on antibiotics.  She was going to be admitted, but patient left AMA due to the facility not having a gastroenterologist  ED Course:  In the emergency department, BP was 103/59, other vital signs are within normal range.  Workup in the ED showed WBC 19.5, hemoglobin 9.4, hematocrit 32.3 platelets 197, MCV 100.5.  BMP showed sodium 128, potassium 2.7, chloride 88, bicarb 30, blood glucose 103, BUN less than 5, creatinine 0.37, alcohol level less than 10, acetaminophen level less than 10, urinalysis was unimpressive for UTI. Right upper quadrant abdominal  ultrasound showed cirrhotic changes of the liver with nodularity. CT abdomen and pelvis done at [facility] on 05/13/2023 1. Severe hepatomegaly with fatty infiltration of the liver. Prominent upper abdominal varices.  2. Diffuse mesenteric infiltration likely edema. This may indicate mesenteritis. No loculated collections.  3. Colonic mucosal fat deposition is likely physiologic but could indicate early changes of inflammatory bowel disease. No evidence of bowel obstruction.  4. Small esophageal hiatal hernia.  She was treated with IV ceftriaxone, fentanyl was given and Zofran was provided.  Hospitalist was asked to admit patient for further evaluation and management.  Review of Systems: Review of systems as noted in the HPI. All other systems reviewed and are negative.   Past Medical History:  Diagnosis Date   Abnormal vaginal Pap smear    ASCUS of cervix with negative high risk HPV 09/01/2021   Repeat papin 3 years, per [facility], 5 year risk for CIN 3+ is 0.40%   Depression    Elevated liver enzymes 08/01/2021   Stop alcohol, recheck at appt, get [place] 9/1   Migraines    Neuropathy    Pregnant 06/19/2015   Sludge in gallbladder 09/01/2021   Refer to Dr Lovell Sheehan   Vaginal Pap smear, abnormal    Past Surgical History:  Procedure Laterality Date   BACK SURGERY     CESAREAN SECTION  x 2   CESAREAN SECTION WITH BILATERAL TUBAL LIGATION Bilateral 02/11/2016   Procedure: REPEAT CESAREAN SECTION WITH BILATERAL TUBAL LIGATION;  Surgeon: Tilda Burrow, MD;  Location: [facility];  Service: Obstetrics;  Laterality: Bilateral;   CHOLECYSTECTOMY N/A 10/22/2021   Procedure: LAPAROSCOPIC  CHOLECYSTECTOMY;  Surgeon: Franky Macho, MD;  Location: [facility];  Service: General;  Laterality: N/A;   TONSILLECTOMY AND ADENOIDECTOMY      Social History:  reports that she has been smoking cigarettes. She started smoking about 15 years ago. She has a 19.00 pack-year smoking history. She has never used smokeless tobacco. She  reports current alcohol use. She reports that she does not use drugs.   Allergies  Allergen Reactions   Zomig [Zolmitriptan] Shortness Of Breath and Swelling    Imitrex is tolerated well    Family History  Problem Relation Age of Onset   Dementia Paternal Grandfather    Dementia Paternal Grandmother      Prior to Admission medications   Medication Sig Start Date End Date Taking? Authorizing Provider  aspirin EC 81 MG tablet Take 81 mg by mouth daily. Swallow whole.    [provider]  folic acid (FOLVITE) 1 MG tablet Take 1 tablet (1 mg total) by mouth daily. 09/21/22   [employer], Alvino Chapel, FNP  gabapentin (NEURONTIN) 100 MG capsule Take 100 mg by mouth 3 (three) times daily.    [provider]  Iron, Ferrous Sulfate, 325 (65 Fe) MG TABS Take 325 mg by mouth daily. 09/21/22   [employer], Alvino Chapel, FNP  potassium chloride (KLOR-CON M) 10 MEQ tablet Take 1 tablet (10 mEq total) by mouth daily. 09/21/22   [employer], Alvino Chapel, FNP    Physical Exam: BP (!) 105/58 (BP Location: Right Arm)   Pulse 79   Temp 97.9 F (36.6 C) (Oral)   Resp 18   Ht 5\' 4"  (1.626 m)   Wt 61.2 kg   LMP 02/13/2021 (Approximate) Comment: LMP WAS IN MARCH  SpO2 98%   BMI 23.17 kg/m   General: 42 y.o. year-old female ill appearing, but in no acute distress.  Alert and oriented x3. HEENT: NCAT, EOMI, icteric sclera Neck: Supple, trachea medial Cardiovascular: Regular rate and rhythm with no rubs or gallops.  No thyromegaly or JVD noted.  Trace lower extremity edema bilaterally. 2/4 pulses in all 4 extremities. Respiratory: Clear to auscultation with no wheezes or rales. Good inspiratory effort. Abdomen: Soft, generalized tenderness worse in RUQ without guarding. Distended with normal bowel sounds x4 quadrants. Muskuloskeletal: No cyanosis, clubbing or edema noted bilaterally Neuro: CN II-XII intact, strength 5/5 x 4, sensation, reflexes intact Skin: No ulcerative lesions noted or  rashes Psychiatry: Judgement and insight appear normal. Mood is appropriate for condition and setting          Labs on Admission:  Basic Metabolic Panel: Recent Labs  Lab 05/25/23 1541  NA 128*  K 2.7*  CL 88*  CO2 30  GLUCOSE 103*  BUN <5*  CREATININE 0.37*  CALCIUM 7.6*   Liver Function Tests: Recent Labs  Lab 05/25/23 1541  AST 118*  ALT 31  ALKPHOS 139*  BILITOT 16.2*  PROT 6.3*  ALBUMIN 2.2*   Recent Labs  Lab 05/25/23 1541  LIPASE 29   Recent Labs  Lab 05/25/23 1541  AMMONIA 35   CBC: Recent Labs  Lab 05/25/23 1541  WBC 19.5*  HGB 11.4*  HCT 32.3*  MCV 109.5*  PLT 187   Cardiac Enzymes: No results for input(s): "CKTOTAL", "CKMB", "CKMBINDEX", "TROPONINI" in the last 168 hours.  BNP (last 3 results) No results for input(s): "BNP" in the last 8760 hours.  ProBNP (last 3 results) No results for input(s): "PROBNP" in the last 8760 hours.  CBG: No results for input(s): "  GLUCAP" in the last 168 hours.  Radiological Exams on Admission: US ABDOMEN LIMITED RUQ (LIVER/GB)  Result Date: 05/25/2023 CLINICAL DATA:  Abdominal pain EXAM: ULTRASOUND ABDOMEN LIMITED RIGHT UPPER QUADRANT COMPARISON:  None Available. FINDINGS: Gallbladder: Surgically removed. Common bile duct: Diameter: 8.2 mm. Liver: Mild nodularity is noted. Increased echogenicity is noted. Portal vein shows no significant flow consistent with occlusion. Other: None. IMPRESSION: Status post cholecystectomy. Cirrhotic changes of the liver with nodularity. The portal vein shows no significant flow within. This is of uncertain chronicity. CT of the abdomen with contrast (with delayed venous phase images) would be helpful for further evaluation. Electronically Signed   By: Alcide Clever M.D.   On: 05/25/2023 20:59    EKG: I independently viewed the EKG done and my findings are as followed: EKG was not done in the ED  Assessment/Plan Present on Admission:  Abdominal pain   Hypokalemia  Principal Problem:   Hepatic cirrhosis (HCC) Active Problems:   Hypokalemia   Abdominal pain   Ascites   Nausea & vomiting   Hyponatremia   Macrocytic anemia   Transaminitis  Hepatic cirrhosis and ascites RUQ suggestive of cirrhosis, this is possibly due to alcohol induced, but patient states she quit alcohol use since 2 years ago IR will be consulted for possible paracentesis in the morning Gastroenterology will be consulted  Nausea, vomiting, abdominal pain Continue Zofran as needed Continue Dilaudid as needed  Macrocytic anemia Iron deficiency anemia MCV 109.5 Vitamin B12 and folate levels will be checked Continue ferrous sulfate  Hyponatremia possibly secondary to hydration, rule out beer potomania Na 128,  Urine osmolality, serum osmolality and urine sodium will be checked  Hypokalemia K+ 2.7, this will be replenished  Transaminitis possibly due to cirrhosis AST 118, ALT 31, ALP 139 Continue to monitor liver enzymes  DVT prophylaxis: SCDs   Advance Care Planning: Full code  Consults: Gastroenterology  Family Communication: None at bedside  Severity of Illness: The appropriate patient status for this patient is INPATIENT. Inpatient status is judged to be reasonable and necessary in order to provide the required intensity of service to ensure the patient's safety. The patient's presenting symptoms, physical exam findings, and initial radiographic and laboratory data in the context of their chronic comorbidities is felt to place them at high risk for further clinical deterioration. Furthermore, it is not anticipated that the patient will be medically stable for discharge from the hospital within 2 midnights of admission.   * I certify that at the point of admission it is my clinical judgment that the patient will require inpatient hospital care spanning beyond 2 midnights from the point of admission due to high intensity of service, high risk for  further deterioration and high frequency of surveillance required.*  Author: Frankey Shown, DO 05/25/2023 11:07 PM  For on call review [email or web address].

## 2023-05-25 NOTE — Progress Notes (Signed)
Albumin administration states, "Only with paracentesis." Pharmacy contacted and they instructed this nurse to move dose to tomorrow morning.   Pt has reported 6/10 pain in abdomen several times during the night, PRN Dilaudid given with some relief. Zofran given for reported nausea, unchanged upon reassessment. MD notified, Compazine given per order. Pt's BP and MAP low this a.m. MD notified.

## 2023-05-25 NOTE — ED Provider Notes (Signed)
[place] EMERGENCY DEPARTMENT AT [facility] Provider Note   CSN: [id number] Arrival date & time: 05/25/23  1519     History {Add pertinent medical, surgical, social history, OB history to HPI:1} Chief Complaint  Patient presents with   Ascites    Pamela [name] is a 43 y.o. female who presents to the ED with chief complaint of abdominal pain x 2 weeks. She has been experiencing progressive abdominal bloating and states that her abdomen is now firm to the touch and rates her pain 6/10. The patient states the pain affects her entire abdomen, but it worse in her RUQ and there is some radiation to the back. She has associated nausea, vomiting, and diarrhea for which she has tried zofran with no improvement in her symptoms. She states that it is nearly impossible to keep any food or medication down without vomiting. She believes her condition may be related to her cholecystectomy she had approximately 1.5 years ago as she has not felt normal since the procedure occurred. The patient states she has appeared jaundice for at least 2 weeks, but has not had an alcoholic beverage in at least one month.  Seen on on 05/13/2023 at [facility] emergency department and had a workup.  I reviewed the side records and labs.  She had a CT scan that showed severe hepatomegaly with fatty infiltration of the liver, esophageal and abdominal varices, ascites and mesenteric infiltration with edema.  She had a urine that was infected and grew out greater than 100,000 units of E. coli.  Patient was unable to tolerate completing her antibiotic prescribed at the last visit.  She had a leukocytosis, and alcohol level of 87, hypokalemia at 2.5, and elevated bilirubin at 9.8 with an associated AST of 106 ALT of 35 and an alk phos of 258.  Patient states that she left AGAINST MEDICAL ADVICE because they did not have a gastrologist onsite nor were they going to be able to do an EGD or other interventions over the  weekend so she thought she could just follow-up here with a gastroenterologist however she has Medicaid and needed a direct referral.  Patient states that she is not drinking heavily and only on occasion and denies excessive Tylenol use, IV drug use. HPI     Home Medications Prior to Admission medications   Medication Sig Start Date End Date Taking? Authorizing Provider  aspirin EC 81 MG tablet Take 81 mg by mouth daily. Swallow whole.    [provider]  folic acid (FOLVITE) 1 MG tablet Take 1 tablet (1 mg total) by mouth daily. 09/21/22   [employer]med[employer], Alvino Chapel, FNP  gabapentin (NEURONTIN) 100 MG capsule Take 100 mg by mouth 3 (three) times daily.    [provider]  Iron, Ferrous Sulfate, 325 (65 Fe) MG TABS Take 325 mg by mouth daily. 09/21/22   [employer]med[employer], Alvino Chapel, FNP  potassium chloride (KLOR-CON M) 10 MEQ tablet Take 1 tablet (10 mEq total) by mouth daily. 09/21/22   [employer]med[employer], Alvino Chapel, FNP      Allergies    Zomig [zolmitriptan]    Review of Systems   Review of Systems  Physical Exam Updated Vital Signs BP 112/60   Pulse 87   Temp 98.4 F (36.9 C) (Oral)   Resp 16   Ht 5\' 4"  (1.626 m)   Wt 61.2 kg   LMP 02/13/2021 (Approximate) Comment: LMP WAS IN MARCH  SpO2 97%   BMI 23.17 kg/m  Physical Exam  Vitals and nursing note reviewed.  Constitutional:      General: She is not in acute distress.    Appearance: She is well-developed. She is not diaphoretic.  HENT:     Head: Normocephalic and atraumatic.     Right Ear: External ear normal.     Left Ear: External ear normal.     Nose: Nose normal.     Mouth/Throat:     Mouth: Mucous membranes are moist.  Eyes:     General: Scleral icterus present.     Conjunctiva/sclera: Conjunctivae normal.  Cardiovascular:     Rate and Rhythm: Normal rate and regular rhythm.     Heart sounds: Normal heart sounds. No murmur heard.    No friction rub. No gallop.  Pulmonary:     Effort: Pulmonary effort is normal. No  respiratory distress.     Breath sounds: Normal breath sounds.  Abdominal:     General: Bowel sounds are normal. There is no distension.     Palpations: Abdomen is soft. There is no mass.     Tenderness: There is no abdominal tenderness. There is no guarding.  Musculoskeletal:     Cervical back: Normal range of motion.     Right lower leg: Edema present.     Left lower leg: Edema present.  Skin:    General: Skin is warm and dry.  Neurological:     Mental Status: She is alert and oriented to person, place, and time.  Psychiatric:        Behavior: Behavior normal.     ED Results / Procedures / Treatments   Labs (all labs ordered are listed, but only abnormal results are displayed) Labs Reviewed  CBC - Abnormal; Notable for the following components:      Result Value   WBC 19.5 (*)    RBC 2.95 (*)    Hemoglobin 11.4 (*)    HCT 32.3 (*)    MCV 109.5 (*)    MCH 38.6 (*)    RDW 18.9 (*)    All other components within normal limits  PROTIME-INR - Abnormal; Notable for the following components:   Prothrombin Time 24.7 (*)    INR 2.2 (*)    All other components within normal limits  AMMONIA  LIPASE, BLOOD  COMPREHENSIVE METABOLIC PANEL  URINALYSIS, ROUTINE W REFLEX MICROSCOPIC  HEPATITIS PANEL, ACUTE  ETHANOL  ACETAMINOPHEN LEVEL    EKG None  Radiology No results found.  Procedures Procedures  {Document cardiac monitor, telemetry assessment procedure when appropriate:1}  Medications Ordered in ED Medications  ondansetron (ZOFRAN) injection 4 mg (4 mg Intravenous Given 05/25/23 1710)    ED Course/ Medical Decision Making/ A&P Clinical Course as of 05/25/23 1715  Tue May 25, 2023  1711 CBC(!) [AH]    Clinical Course User Index [AH] Arthor Captain, PA-C   {   Click here for ABCD2, HEART and other calculatorsREFRESH Note before signing :1}                          Medical Decision Making Amount and/or Complexity of Data Reviewed Labs:  ordered. ECG/medicine tests: ordered.  Risk Prescription drug management.   ***  {Document critical care time when appropriate:1} {Document review of labs and clinical decision tools ie heart score, Chads2Vasc2 etc:1}  {Document your independent review of radiology images, and any outside records:1} {Document your discussion with family members, caretakers, and with consultants:1} {Document social determinants of health affecting  pt's care:1} {Document your decision making why or why not admission, treatments were needed:1} Final Clinical Impression(s) / ED Diagnoses Final diagnoses:  None    Rx / DC Orders ED Discharge Orders     None

## 2023-05-26 ENCOUNTER — Inpatient Hospital Stay (HOSPITAL_COMMUNITY): Payer: Medicaid Other

## 2023-05-26 DIAGNOSIS — R7401 Elevation of levels of liver transaminase levels: Secondary | ICD-10-CM

## 2023-05-26 DIAGNOSIS — K7031 Alcoholic cirrhosis of liver with ascites: Secondary | ICD-10-CM

## 2023-05-26 DIAGNOSIS — R112 Nausea with vomiting, unspecified: Secondary | ICD-10-CM

## 2023-05-26 DIAGNOSIS — K7011 Alcoholic hepatitis with ascites: Secondary | ICD-10-CM

## 2023-05-26 DIAGNOSIS — D539 Nutritional anemia, unspecified: Secondary | ICD-10-CM

## 2023-05-26 DIAGNOSIS — E876 Hypokalemia: Secondary | ICD-10-CM

## 2023-05-26 DIAGNOSIS — R1084 Generalized abdominal pain: Secondary | ICD-10-CM

## 2023-05-26 DIAGNOSIS — E871 Hypo-osmolality and hyponatremia: Secondary | ICD-10-CM

## 2023-05-26 LAB — COMPREHENSIVE METABOLIC PANEL
ALT: 28 U/L (ref 0–44)
AST: 92 U/L — ABNORMAL HIGH (ref 15–41)
Albumin: 1.8 g/dL — ABNORMAL LOW (ref 3.5–5.0)
Alkaline Phosphatase: 117 U/L (ref 38–126)
Anion gap: 9 (ref 5–15)
BUN: <5 mg/dL — ABNORMAL LOW (ref 6–20)
CO2: 29 mmol/L (ref 22–32)
Calcium: 7.5 mg/dL — ABNORMAL LOW (ref 8.9–10.3)
Chloride: 89 mmol/L — ABNORMAL LOW (ref 98–111)
Creatinine, Ser: <0.3 mg/dL — ABNORMAL LOW (ref 0.44–1.00)
Glucose, Bld: 87 mg/dL (ref 70–99)
Potassium: 2.9 mmol/L — ABNORMAL LOW (ref 3.5–5.1)
Sodium: 127 mmol/L — ABNORMAL LOW (ref 135–145)
Total Bilirubin: 14.1 mg/dL — ABNORMAL HIGH (ref 0.3–1.2)
Total Protein: 5.8 g/dL — ABNORMAL LOW (ref 6.5–8.1)

## 2023-05-26 LAB — CBC
HCT: 30.1 % — ABNORMAL LOW (ref 36.0–46.0)
Hemoglobin: 10.6 g/dL — ABNORMAL LOW (ref 12.0–15.0)
MCH: 38.8 pg — ABNORMAL HIGH (ref 26.0–34.0)
MCHC: 35.2 g/dL (ref 30.0–36.0)
MCV: 110.3 fL — ABNORMAL HIGH (ref 80.0–100.0)
Platelets: 163 10*3/uL (ref 150–400)
RBC: 2.73 MIL/uL — ABNORMAL LOW (ref 3.87–5.11)
RDW: 18.8 % — ABNORMAL HIGH (ref 11.5–15.5)
WBC: 19.3 10*3/uL — ABNORMAL HIGH (ref 4.0–10.5)
nRBC: 0.1 % (ref 0.0–0.2)

## 2023-05-26 LAB — FOLATE: Folate: 3.9 ng/mL — ABNORMAL LOW (ref >5.9)

## 2023-05-26 LAB — OSMOLALITY, URINE: Osmolality, Ur: 538 mOsm/kg (ref 300–900)

## 2023-05-26 LAB — HIV ANTIBODY (ROUTINE TESTING W REFLEX): HIV Screen 4th Generation wRfx: NONREACTIVE

## 2023-05-26 LAB — SODIUM, URINE, RANDOM: Sodium, Ur: 15 mmol/L

## 2023-05-26 LAB — PHOSPHORUS: Phosphorus: 2.4 mg/dL — ABNORMAL LOW (ref 2.5–4.6)

## 2023-05-26 LAB — OSMOLALITY: Osmolality: 266 mOsm/kg — ABNORMAL LOW (ref 275–295)

## 2023-05-26 LAB — VITAMIN B12: Vitamin B-12: 634 pg/mL (ref 180–914)

## 2023-05-26 LAB — MAGNESIUM: Magnesium: 1.7 mg/dL (ref 1.7–2.4)

## 2023-05-26 MED ORDER — PREDNISOLONE 5 MG PO TABS: 10.0000 mg | ORAL_TABLET | Freq: Every day | ORAL | Status: DC

## 2023-05-26 MED ORDER — PREDNISOLONE 5 MG PO TABS: 5.0000 mg | ORAL_TABLET | Freq: Every day | ORAL | Status: DC

## 2023-05-26 MED ORDER — PREDNISOLONE 5 MG PO TABS: 25.0000 mg | ORAL_TABLET | Freq: Every day | ORAL | Status: DC

## 2023-05-26 MED ORDER — PREDNISOLONE 5 MG PO TABS: 40.0000 mg | ORAL_TABLET | Freq: Every day | ORAL | Status: DC

## 2023-05-26 MED ORDER — PREDNISOLONE 5 MG PO TABS
40.0000 mg | ORAL_TABLET | Freq: Every day | ORAL | Status: DC
  Administered 2023-05-26 – 2023-05-27 (×2): 40 mg via ORAL
  Filled 2023-05-26 (×3): qty 8

## 2023-05-26 MED ORDER — PREDNISOLONE 5 MG PO TABS: 35.0000 mg | ORAL_TABLET | Freq: Every day | ORAL | Status: DC

## 2023-05-26 MED ORDER — SODIUM CHLORIDE 0.9 % IV SOLN: INTRAVENOUS | Status: DC

## 2023-05-26 MED ORDER — PROCHLORPERAZINE EDISYLATE 10 MG/2ML IJ SOLN
10.0000 mg | Freq: Four times a day (QID) | INTRAMUSCULAR | Status: DC | PRN
  Administered 2023-05-26 – 2023-05-27 (×3): 10 mg via INTRAVENOUS
  Filled 2023-05-26 (×3): qty 2

## 2023-05-26 MED ORDER — PREDNISOLONE 5 MG PO TABS: 20.0000 mg | ORAL_TABLET | Freq: Every day | ORAL | Status: DC

## 2023-05-26 MED ORDER — IOHEXOL 300 MG/ML  SOLN
80.0000 mL | Freq: Once | INTRAMUSCULAR | Status: AC | PRN
  Administered 2023-05-26: 80 mL via INTRAVENOUS

## 2023-05-26 MED ORDER — PREDNISOLONE 5 MG PO TABS: 30.0000 mg | ORAL_TABLET | Freq: Every day | ORAL | Status: DC

## 2023-05-26 MED ORDER — POTASSIUM CHLORIDE IN NACL 40-0.9 MEQ/L-% IV SOLN: INTRAVENOUS | Status: DC

## 2023-05-26 MED ORDER — PANTOPRAZOLE SODIUM 40 MG IV SOLR
40.0000 mg | Freq: Two times a day (BID) | INTRAVENOUS | Status: DC
  Administered 2023-05-26 – 2023-05-29 (×7): 40 mg via INTRAVENOUS
  Filled 2023-05-26 (×7): qty 10

## 2023-05-26 MED ORDER — FOLIC ACID 1 MG PO TABS
1.0000 mg | ORAL_TABLET | Freq: Every day | ORAL | Status: DC
  Administered 2023-05-26 – 2023-05-29 (×4): 1 mg via ORAL
  Filled 2023-05-26 (×4): qty 1

## 2023-05-26 MED ORDER — PREDNISOLONE 5 MG PO TABS: 15.0000 mg | ORAL_TABLET | Freq: Every day | ORAL | Status: DC

## 2023-05-26 MED ORDER — GUAIFENESIN-DM 100-10 MG/5ML PO SYRP
5.0000 mL | ORAL_SOLUTION | ORAL | Status: DC | PRN
  Administered 2023-05-26 – 2023-05-29 (×2): 5 mL via ORAL
  Filled 2023-05-26 (×2): qty 5

## 2023-05-26 NOTE — Progress Notes (Signed)
Progress Note   Patient: Pamela [name] XBJ:[id number] DOB: 1981-03-03 DOA: 05/25/2023     1 DOS: the patient was seen and examined on 05/26/2023   Brief hospital admission narrative: As per H&P written by Dr. Thomes Dinning on 05/25/2023  Pamela [name] is a 42 y.o. female with medical history significant of occasional tobacco use and prior alcohol abuse who presents to the emergency department due to 2-week onset of abdominal pain.  Patient complained of generalized abdominal pain which is worse in right upper quadrant, pain was rated as 6/10, it was intermittent also associated with nausea, vomiting and difficulty in being able to hold anything down without vomiting.  Patient also complained of abdominal bloating which also has been progressively worsening since onset of symptoms about 2 weeks ago.  She endorsed chronic diarrhea which started shortly after her gallbladder removal operation. She was recently seen at [facility] ED on 05/13/2023.  CT scan of her abdomen and pelvis done at that time per ED medical record showed severe hepatomegaly with fatty infiltration of the liver, esophageal and abdominal varices, ascites and mesenteric infiltration with edema per ED medical record.  She was diagnosed with UTI due to urine culture being positive for greater than 100,000 units of E. coli.  She was started on antibiotics.  She was going to be admitted, but patient left AMA due to the facility not having a gastroenterologist   ED Course:  In the emergency department, BP was 103/59, other vital signs are within normal range.  Workup in the ED showed WBC 19.5, hemoglobin 9.4, hematocrit 32.3 platelets 197, MCV 100.5.  BMP showed sodium 128, potassium 2.7, chloride 88, bicarb 30, blood glucose 103, BUN less than 5, creatinine 0.37, alcohol level less than 10, acetaminophen level less than 10, urinalysis was unimpressive for UTI. Right upper quadrant abdominal ultrasound showed cirrhotic changes of the liver  with nodularity. CT abdomen and pelvis done at [facility] on 05/13/2023 1. Severe hepatomegaly with fatty infiltration of the liver. Prominent upper abdominal varices.  2. Diffuse mesenteric infiltration likely edema. This may indicate mesenteritis. No loculated collections.  3. Colonic mucosal fat deposition is likely physiologic but could indicate early changes of inflammatory bowel disease. No evidence of bowel obstruction.  4. Small esophageal hiatal hernia.  She was treated with IV ceftriaxone, fentanyl was given and Zofran was provided.  Hospitalist was asked to admit patient for further evaluation and management.  Assessment and Plan: 1-hepatic cirrhosis and ascites -Patient findings most likely associated with decompensated alcoholic hepatitis on top of liver cirrhosis -Blood pressure is soft currently limiting paracentesis intervention -Continue supportive care, electrolyte repletion and PPI -Following recommendations by gastroenterology service patient will be started on prednisone -Follow clinical response.  2-nausea/vomiting or abdominal pain -Most likely associated with acute alcoholic hepatitis and distention from cirrhosis/ascites -Albumin has been provided -Continue as needed analgesics and antiemetics -Continue supportive care and follow GI recommendations.  3-folic acid deficiency -Elevated MCV and low folic acid appreciated -Most likely associated with history of alcohol abuse -Normal B12 -Folic acid supplementation have been started.  4-hyponatremia -In the setting of dehydration and alcohol abuse -No neurologic deficits -Gentle fluid resuscitation will be provided -Follow electrolytes trend.  5-hypokalemia -Continue to follow electrolytes and further replete as needed -At time of admission no abnormalities appreciated on EKG. -Magnesium within normal limits.  6-transaminitis/bilirubinemia -MELD score 32 -Associated with history of alcohol abuse and  cirrhosis -Follow GI service recommendation -MDF score 66 and MELD score 32 -Patient will be  treated with prednisone.  7-tobacco abuse -Cessation counseling provided -Nicotine patch declined.  Subjective:  Mild icterus; oriented x 3 and reporting abdominal discomfort and swelling.  No chest pain, no nausea or vomiting currently.  Physical Exam: Vitals:   05/26/23 0509 05/26/23 0742 05/26/23 0848 05/26/23 1348  BP: (!) 98/52 (!) 93/51 (!) 104/56 100/61  Pulse:  80 84 82  Resp:  18 19 16   Temp:  99 F (37.2 C)  98.2 F (36.8 C)  TempSrc:  Oral  Oral  SpO2:  97% 97% 98%  Weight:      Height:       General exam: Alert, awake, oriented x 3; no chest pain, still feeling nauseated but no further vomiting.  Complaining of intermittent abdominal pain.  Positive ascites and jaundice. Respiratory system: Good air movement bilaterally; no using accessory muscle.  Good saturation on room air. Cardiovascular system:RRR. No rubs or gallops; no JVD. Gastrointestinal system: Abdomen is mildly distended, positive fluid wave and appreciated ascites; positive bowel sounds.  No guarding. Central nervous system: Alert and oriented. No focal neurological deficits. Extremities: No cyanosis or clubbing; trace to 1+ edema appreciated bilaterally. Skin: No petechiae.  Positive jaundice Psychiatry: Judgement and insight appear normal. Mood & affect appropriate.   Data Reviewed: Random sodium: 15 Comprehensive metabolic panel: Sodium 127, potassium 2.9, chloride 89, bicarb 29, BUN less than 5, creatinine < 0.30; AST 92, ALT 28, total bilirubin 14.1. RUE:AVWU 19.3, hemoglobin 10.6 and platelet count 163 K   Family Communication: No family at bedside.  Disposition: Status is: Inpatient Remains inpatient appropriate because: IV therapy and treatment for acute decompensated cirrhosis.   Planned Discharge Destination: Home   Time spent: 35 minutes  Author: Vassie Loll, MD 05/26/2023 6:13  PM  For on call review [email or web address].

## 2023-05-26 NOTE — Consult Note (Signed)
Gastroenterology Consult   Referring Provider: No ref. provider found Primary Care Physician:  Babs Sciara, MD Primary Gastroenterologist:  not previously established (carver)  Patient ID: Pamela [name]; [id number]; 07-10-1981   Admit date: 05/25/2023  LOS: 1 day   Date of Consultation: 05/26/2023  Reason for Consultation:  newly diagnosed, decompensated cirrhosis   History of Present Illness   Pamela [name] is a 42 y.o. year old female with prior ETOH abuse who presented to the ED yesterday with 2 week onset of abdominal pain.  Patient reporting generalized abdominal pain though worse in right upper quadrant.  She reported associated nausea and vomiting.  Also complained of abdominal bloating which has progressively worsened since onset of symptoms 2 weeks ago.  Endorsed chronic diarrhea after gallbladder removal.  CT imaging of her abdomen done earlier this month by in ED at [facility] with severe hepatomegaly with fatty infiltration of the liver, esophageal and abdominal varices, ascites and mesenteric infiltration with edema.  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   Consult: Patient reports abdominal swelling, nausea for "a while." She reports no appetite for about 1 year. Had GB removed 1.5 years ago and notes that GI symptoms got worse thereafter. She endorses chronic diarrhea since GB removal with urgency.  She endorses BRBPR and darker stools though she is on PO iron,  cannot elaborate on how long she has seen BRB but thinks it is secondary to frequent BMs. Patient becoming frustrated during encounter about answering questions. Smokes on occasion, denies current alcohol use, states she used to drink alcohol daily for about 1 year after  her daughter died then went to occasionally maybe a couple of times per week, reporting she drank a mixed drink when going out to eat, maybe a glass of wine after work, had noted a while back she felt bad after having any ETOH so she stopped, last drank about 1-1.5 years ago, and is adamant no recent ETOH intake. Denies any new recent medications, herbal or otc supplements.   Last EGD: never Last Colonoscopy: never    Past Medical History:  Diagnosis Date   Abnormal vaginal Pap smear    ASCUS of cervix with negative high risk HPV 09/01/2021   Repeat papin 3 years, per [facility], 5 year risk for CIN 3+ is 0.40%   Depression    Elevated liver enzymes 08/01/2021   Stop alcohol, recheck at appt, get [place] 9/1   Migraines    Neuropathy    Pregnant 06/19/2015   Sludge in gallbladder 09/01/2021   Refer to Dr Lovell Sheehan   Vaginal Pap smear, abnormal     Past Surgical History:  Procedure Laterality Date   BACK SURGERY     CESAREAN SECTION  x 2   CESAREAN SECTION WITH BILATERAL TUBAL LIGATION Bilateral 02/11/2016   Procedure: REPEAT CESAREAN SECTION WITH BILATERAL TUBAL LIGATION;  Surgeon: Tilda Burrow, MD;  Location: [facility];  Service: Obstetrics;  Laterality: Bilateral;   CHOLECYSTECTOMY N/A 10/22/2021   Procedure: LAPAROSCOPIC CHOLECYSTECTOMY;  Surgeon: Franky Macho, MD;  Location: [facility];  Service: General;  Laterality: N/A;   TONSILLECTOMY AND ADENOIDECTOMY      Prior to Admission medications   Medication Sig Start Date End Date Taking? Authorizing Provider  gabapentin (NEURONTIN) 100 MG capsule Take 100 mg by mouth 3 (three) times daily as needed (pain).   Yes [provider]  SUBOXONE 8-2 MG FILM Take 1 Film by mouth daily. 04/17/23  Yes [provider]    Current Facility-Administered Medications  Medication Dose Route Frequency Provider Last Rate Last Admin   0.9 % NaCl with KCl 40 mEq / L  infusion   Intravenous Continuous Madueme, Elvira C, RPH       albumin human 25  % solution 25 g  25 g Intravenous Once Adefeso, Oladapo, DO       ferrous sulfate tablet 325 mg  325 mg Oral Daily Adefeso, Oladapo, DO   325 mg at 05/26/23 0849   folic acid (FOLVITE) tablet 1 mg  1 mg Oral Daily Vassie Loll, MD       guaiFENesin-dextromethorphan (ROBITUSSIN DM) 100-10 MG/5ML syrup 5 mL  5 mL Oral Q4H PRN Adefeso, Oladapo, DO   5 mL at 05/26/23 0334   HYDROmorphone (DILAUDID) injection 0.5 mg  0.5 mg Intravenous Q3H PRN Adefeso, Oladapo, DO   0.5 mg at 05/26/23 0854   ondansetron (ZOFRAN) tablet 4 mg  4 mg Oral Q6H PRN Adefeso, Oladapo, DO   4 mg at 05/26/23 0108   Or   ondansetron (ZOFRAN) injection 4 mg  4 mg Intravenous Q6H PRN Adefeso, Oladapo, DO       pantoprazole (PROTONIX) injection 40 mg  40 mg Intravenous Q12H Vassie Loll, MD       prochlorperazine (COMPAZINE) injection 10 mg  10 mg Intravenous Q6H PRN Adefeso, Oladapo, DO   10 mg at 05/26/23 0553    Allergies as of 05/25/2023 - Review Complete 05/25/2023  Allergen Reaction Noted   Zomig [zolmitriptan] Shortness Of Breath and Swelling 07/30/2011    Family History  Problem Relation Age of Onset   Dementia Paternal Grandfather    Dementia Paternal Grandmother     Social History   Socioeconomic History   Marital status: Divorced    Spouse name: Not on file   Number of children: Not on file   Years of education: 2 yr coll.   Highest education level: Not on file  Occupational History   Occupation: event planner  Tobacco Use   Smoking status: Every Day    Packs/day: 1.00    Years: 19.00    Additional pack years: 0.00    Total pack years: 19.00    Types: Cigarettes    Start date: 08/02/2007   Smokeless tobacco: Never   Tobacco comments:    smokes 6 cig per day  Vaping Use   Vaping Use: Never used  Substance and Sexual Activity   Alcohol use: Yes    Comment: occ   Drug use: No    Types: Marijuana   Sexual activity: Not Currently    Birth control/protection: Surgical    Comment: tubal   Other Topics Concern   Not on file  Social History Narrative   Not on file   Social Determinants of Health   Financial Resource Strain: High Risk (08/20/2021)   Overall Financial Resource Strain (CARDIA)    Difficulty of Paying Living Expenses: Very hard  Food Insecurity: No Food Insecurity (05/25/2023)   Hunger Vital Sign    Worried About Running Out of Food in the Last Year: Never true    Ran Out of Food in the Last Year: Never true  Transportation Needs: No Transportation Needs (05/25/2023)   PRAPARE - Transportation  Lack of Transportation (Medical): No    Lack of Transportation (Non-Medical): No  Physical Activity: Inactive (08/20/2021)   Exercise Vital Sign    Days of Exercise per Week: 0 days    Minutes of Exercise per Session: 0 min  Stress: Stress Concern Present (08/20/2021)   [employer] of Occupational Health - Occupational Stress Questionnaire    Feeling of Stress : Very much  Social Connections: Socially Isolated (08/20/2021)   Social Connection and Isolation Panel [NHANES]    Frequency of Communication with Friends and Family: Three times a week    Frequency of Social Gatherings with Friends and Family: Never    Attends Religious Services: Never    Database administrator or Organizations: No    Attends Banker Meetings: Never    Marital Status: Divorced  Catering manager Violence: Not At Risk (05/25/2023)   Humiliation, Afraid, Rape, and Kick questionnaire    Fear of Current or Ex-Partner: No    Emotionally Abused: No    Physically Abused: No    Sexually Abused: No     Review of Systems   Gen: Denies any fever, chills +decreased appetite  CV: Denies chest pain, heart palpitations, syncope, edema  Resp: Denies shortness of breath with rest, cough, wheezing, coughing up blood, and pleurisy. GI: constipation, dysphagia, odyonophagia, early satiety or weight loss. +decreased appetite +abdominal pain and distention +nausea +vomiting  +hematochezia +dark stools +jaundice GU : Denies urinary burning, blood in urine, urinary frequency, and urinary incontinence. MS: Denies joint pain, limitation of movement, swelling, cramps, and atrophy.  Derm: Denies rash, itching, dry skin, hives. Psych: Denies depression, anxiety, memory loss, hallucinations, and confusion. Heme: Denies bruising or bleeding Neuro:  Denies any headaches, dizziness, paresthesias, shaking  Physical Exam   Vital Signs in last 24 hours: Temp:  [97.1 F (36.2 C)-99 F (37.2 C)] 99 F (37.2 C) (06/19 0742) Pulse Rate:  [77-89] 84 (06/19 0848) Resp:  [13-19] 19 (06/19 0848) BP: (90-112)/(48-60) 104/56 (06/19 0848) SpO2:  [92 %-99 %] 97 % (06/19 0848) FiO2 (%):  [21 %] 21 % (06/18 2239) Weight:  [59.8 kg-61.2 kg] 59.8 kg (06/18 2152)   General:   Alert, cooperative in NAD, ill appearing  Head:  Normocephalic and atraumatic. Eyes:  sclera are icteric. Mouth:  No deformity or lesions, dentition normal. Lungs:  Clear throughout to auscultation.   No wheezes, crackles, or rhonchi. No acute distress. Heart:  Regular rate and rhythm; no murmurs, clicks, rubs,  or gallops. Abdomen:  Soft, TTP generalized abdomen, worse to RUQ, abdomen is distended but not taut No masses, hernias noted. Normal bowel sounds, without guarding, and without rebound.   Msk:  Symmetrical without gross deformities. Normal posture. Extremities:  Without clubbing or edema. Neurologic:  Alert and  oriented x4. No asterixis  Skin:  jaundiced  Psych:  Alert and cooperative. Patient appears frustrated, irritable   Intake/Output from previous day: 06/18 0701 - 06/19 0700 In: 460 [P.O.:360; IV Piggyback:100] Out: -  Intake/Output this shift: No intake/output data recorded.   Labs/Studies   Recent Labs Recent Labs    05/25/23 1541 05/26/23 0430  WBC 19.5* 19.3*  HGB 11.4* 10.6*  HCT 32.3* 30.1*  PLT 187 163   BMET Recent Labs    05/25/23 1541 05/26/23 0430  NA 128*  127*  K 2.7* 2.9*  CL 88* 89*  CO2 30 29  GLUCOSE 103* 87  BUN <5* <5*  CREATININE 0.37* <0.30*  CALCIUM 7.6* 7.5*  LFT Recent Labs    05/25/23 1541 05/26/23 0430  PROT 6.3* 5.8*  ALBUMIN 2.2* 1.8*  AST 118* 92*  ALT 31 28  ALKPHOS 139* 117  BILITOT 16.2* 14.1*   PT/INR Recent Labs    05/25/23 1541  LABPROT 24.7*  INR 2.2*   Hepatitis Panel Recent Labs    05/25/23 1653  HEPBSAG NON REACTIVE  HCVAB NON REACTIVE  HEPAIGM NON REACTIVE  HEPBIGM NON REACTIVE   C-Diff No results for input(s): "CDIFFTOX" in the last 72 hours.  Radiology/Studies US ABDOMEN LIMITED RUQ (LIVER/GB)  Result Date: 05/25/2023 CLINICAL DATA:  Abdominal pain EXAM: ULTRASOUND ABDOMEN LIMITED RIGHT UPPER QUADRANT COMPARISON:  None Available. FINDINGS: Gallbladder: Surgically removed. Common bile duct: Diameter: 8.2 mm. Liver: Mild nodularity is noted. Increased echogenicity is noted. Portal vein shows no significant flow consistent with occlusion. Other: None. IMPRESSION: Status post cholecystectomy. Cirrhotic changes of the liver with nodularity. The portal vein shows no significant flow within. This is of uncertain chronicity. CT of the abdomen with contrast (with delayed venous phase images) would be helpful for further evaluation. Electronically Signed   By: Alcide Clever M.D.   On: 05/25/2023 20:59     Assessment   Pamela [name] is a 42 y.o. year old female with prior ETOH abuse who presented to the ED yesterday with 2 week onset of abdominal pain.  Patient reporting generalized abdominal pain though worse in RUQ, with nausea/vomiting. Recent CT A/P done at [facility] on 6/6 with concern for cirrhosis, EVs and gastric Varices, GI consulted for further evaluation of newly diagnosed, decompensated cirrhosis.   New findings of cirrhosis: Etiology of cirrhosis undetermined at this time, possibly alcohol in setting of previous ETOH abuse. recent CT on 6/6 with changes of cirrhosis, EVs and gastric varices,  [place] during admission also with cirrhotic changes, portal vein showing no significant flow/occlusion. Ammonia WNL, acute hep panel negative. Plt count 163k. Abdomen is distended but soft, interestingly she had minimal ascites for Para on [place] this morning but reports abdomen has been very distended and tight. Will obtain CT liver STAT for further evaluation of possible portal vein thrombus. She will need further workup of cirrhosis as outpatient to determine etiology, though suspect alcohol induced.   She will need EGD once stable, for EV/GV screening. No evidence of variceal bleed at this time.   MELD 3.0: 32 (heavily influenced by hyperbilirubinemia in setting of alcoholic hepatitis)  Hyperbilirubinemia/elevated AST: pattern indicative of alcoholic hepatitis/cholestatic pattern, though patient adamant of no recent ETOH. states she previously drank daily x1 year then transitioned to just on occasion, maybe a mixed drink 1-2x/week when eating out.  Reportedly No etoh in 1-1.5 years. she does endorse generalized abdominal pain worse in RUQ as well as nausea and vomiting. [place] yesterday with CBD 8.2 (s/p cholecystectomy).   Etiology of elevated LFTs unclear at this time, though certainly suspicious for Alcoholic hepatitis. will need to start prednisolone as MDF is 66.5.  Anemia: history of IDA, last iron studies in October 2023 with TIBC 350, Iron 34, Iron sat 10%, ferritin 75, started on iron thereafter.  hgb on admission 11.4, down to 10.6. reports BRBPR intermittently for some time, she has darker stools in setting of PO iron, denies coffee ground emesis or hematemesis. No previous EGD/Colonoscopy. Will need EGD for EV/Gastric varice evaluation at some point, recommend colonoscopy as well for further evaluation of her IDA, colonoscopy could be done on outpatient basis unless overt bleeding or significant drop in hgb.  Plan / Recommendations   Will order CT liver w w/o stat for further evaluation of  portal vein occlusion MELD labs daily Continue to trend h&h, transfuse for hgb <7 Monitor for signs of HE Start prednisolone 40mg  with taper  Plan for EGD/Colonoscopy as outpatient for EV screening, evaluation of IDA  7. Lille score on days 4/7. 8. PPI daily 9. 2g sodium diet 10. Complete ETOH cessation  11. Continue PO iron    05/26/2023, 9:34 AM  Londan Coplen L. Jeanmarie Hubert, MSN, APRN, AGNP-C Adult-Gerontology Nurse Practitioner [facility] at [facility]

## 2023-05-26 NOTE — TOC Initial Note (Signed)
Transition of Care [facility]) - Initial/Assessment Note    Patient Details  Name: Pamela [name] MRN: [id number] Date of Birth: [birth year]  Transition of Care [facility]) CM/SW Contact:    Annice Needy, LCSW Phone Number: 05/26/2023, 12:59 PM  Clinical Narrative:                 Patient from home with children (ages 72 and 30). Admitted for hepatic cirrhosis. Considered high risk for readmission. Independent. Drives. Has PCP. Reports no unmet needs.   Expected Discharge Plan: Home/Self Care Barriers to Discharge: Continued Medical Work up   Patient Goals and CMS Choice Patient states their goals for this hospitalization and ongoing recovery are:: return home          Expected Discharge Plan and Services       Living arrangements for the past 2 months: Single Family Home                                      Prior Living Arrangements/Services Living arrangements for the past 2 months: Single Family Home Lives with:: Adult Children, Minor Children Patient language and need for interpreter reviewed:: Yes Do you feel safe going back to the place where you live?: Yes      Need for Family Participation in Patient Care: No (Comment) Care giver support system in place?: No (comment)   Criminal Activity/Legal Involvement Pertinent to Current Situation/Hospitalization: No - Comment as needed  Activities of Daily Living Home Assistive Devices/Equipment: None ADL Screening (condition at time of admission) Patient's cognitive ability adequate to safely complete daily activities?: Yes Is the patient deaf or have difficulty hearing?: No Does the patient have difficulty seeing, even when wearing glasses/contacts?: No Does the patient have difficulty concentrating, remembering, or making decisions?: No Patient able to express need for assistance with ADLs?: No Does the patient have difficulty dressing or bathing?: No Independently performs ADLs?: Yes (appropriate for developmental  age) Does the patient have difficulty walking or climbing stairs?: No Weakness of Legs: None Weakness of Arms/Hands: None  Permission Sought/Granted                  Emotional Assessment     Affect (typically observed): Appropriate Orientation: : Oriented to Self, Oriented to Place, Oriented to  Time, Oriented to Situation Alcohol / Substance Use: Not Applicable Psych Involvement: No (comment)  Admission diagnosis:  Hypocalcemia [E83.51] Hypokalemia [E87.6] Hyponatremia [E87.1] Acute cystitis without hematuria [N30.00] Abdominal pain [R10.9] Acute liver failure without hepatic coma [K72.00] Patient Active Problem List   Diagnosis Date Noted   Abdominal pain 05/25/2023   Hepatic cirrhosis (HCC) 05/25/2023   Ascites 05/25/2023   Nausea & vomiting 05/25/2023   Hyponatremia 05/25/2023   Macrocytic anemia 05/25/2023   Transaminitis 05/25/2023   Calculus of gallbladder without cholecystitis without obstruction    Hypokalemia 10/21/2021   Protein calorie malnutrition (HCC) 10/21/2021   Hypotension 10/21/2021   Sludge in gallbladder 09/01/2021   ASCUS of cervix with negative high risk HPV 09/01/2021   Routine general medical examination at a health care facility 08/20/2021   Encounter for gynecological examination with Papanicolaou smear of cervix 08/20/2021   Menopausal state 08/20/2021   Screening mammogram for breast cancer 08/20/2021   Encounter for screening fecal occult blood testing 08/20/2021   Elevated liver enzymes 08/01/2021   RUQ pain 07/30/2021   Epigastric pain 07/30/2021   Irregular periods 07/30/2021  Hot flashes 07/30/2021   Anxiety and depression 07/30/2021   GERD (gastroesophageal reflux disease) 07/04/2021   Acute left-sided low back pain with left-sided sciatica 06/27/2020   Subclinical hyperthyroidism 04/01/2016   Status post repeat low transverse cesarean section 02/11/2016   Trichomonas infection 12/23/2015   Chronic prescription opiate use  11/19/2015   Alcohol use affecting pregnancy, antepartum 11/19/2015   Marijuana use 11/19/2015   Chronic low back pain 10/22/2015   Depression 08/06/2015   Sciatica 08/06/2015   Susceptible to varicella (non-immune), currently pregnant 07/17/2015   Supervision of other high-risk pregnancy 07/16/2015   AMA (advanced maternal age) multigravida 35+ 07/16/2015   Previous cesarean delivery, antepartum 07/16/2015   Lumbar herniated disc 07/16/2015   Smoker 07/16/2015   Rotator cuff syndrome of left shoulder 07/30/2011   Shoulder subluxation, left 07/30/2011   PCP:  Babs Sciara, MD Pharmacy:   [facility] - [place], [place] - [address] [place] [address] Phone: [phone] Fax: [phone]     Social Determinants of Health (SDOH) Social History: SDOH Screenings   Food Insecurity: No Food Insecurity (05/25/2023)  Housing: Low Risk  (05/25/2023)  Transportation Needs: No Transportation Needs (05/25/2023)  Utilities: Not At Risk (05/25/2023)  Alcohol Screen: Low Risk  (08/20/2021)  Depression (PHQ2-9): Low Risk  (09/17/2022)  Financial Resource Strain: High Risk (08/20/2021)  Physical Activity: Inactive (08/20/2021)  Social Connections: Socially Isolated (08/20/2021)  Stress: Stress Concern Present (08/20/2021)  Tobacco Use: High Risk (05/25/2023)   SDOH Interventions:     Readmission Risk Interventions     No data to display

## 2023-05-27 DIAGNOSIS — K7011 Alcoholic hepatitis with ascites: Secondary | ICD-10-CM | POA: Diagnosis not present

## 2023-05-27 DIAGNOSIS — D649 Anemia, unspecified: Secondary | ICD-10-CM

## 2023-05-27 DIAGNOSIS — D539 Nutritional anemia, unspecified: Secondary | ICD-10-CM | POA: Diagnosis not present

## 2023-05-27 DIAGNOSIS — K7031 Alcoholic cirrhosis of liver with ascites: Secondary | ICD-10-CM | POA: Diagnosis not present

## 2023-05-27 DIAGNOSIS — R7401 Elevation of levels of liver transaminase levels: Secondary | ICD-10-CM | POA: Diagnosis not present

## 2023-05-27 LAB — CBC WITH DIFFERENTIAL/PLATELET
Abs Immature Granulocytes: 0.17 10*3/uL — ABNORMAL HIGH (ref 0.00–0.07)
Basophils Absolute: 0 10*3/uL (ref 0.0–0.1)
Basophils Relative: 0 %
Eosinophils Absolute: 0 10*3/uL (ref 0.0–0.5)
Eosinophils Relative: 0 %
HCT: 30.6 % — ABNORMAL LOW (ref 36.0–46.0)
Hemoglobin: 11 g/dL — ABNORMAL LOW (ref 12.0–15.0)
Immature Granulocytes: 1 %
Lymphocytes Relative: 10 %
Lymphs Abs: 1.7 10*3/uL (ref 0.7–4.0)
MCH: 39.4 pg — ABNORMAL HIGH (ref 26.0–34.0)
MCHC: 35.9 g/dL (ref 30.0–36.0)
MCV: 109.7 fL — ABNORMAL HIGH (ref 80.0–100.0)
Monocytes Absolute: 0.6 10*3/uL (ref 0.1–1.0)
Monocytes Relative: 4 %
Neutro Abs: 13.9 10*3/uL — ABNORMAL HIGH (ref 1.7–7.7)
Neutrophils Relative %: 85 %
Platelets: 175 10*3/uL (ref 150–400)
RBC: 2.79 MIL/uL — ABNORMAL LOW (ref 3.87–5.11)
RDW: 18.9 % — ABNORMAL HIGH (ref 11.5–15.5)
WBC: 16.4 10*3/uL — ABNORMAL HIGH (ref 4.0–10.5)
nRBC: 0 % (ref 0.0–0.2)

## 2023-05-27 LAB — FERRITIN: Ferritin: 263 ng/mL (ref 11–307)

## 2023-05-27 LAB — HEPATIC FUNCTION PANEL
ALT: 32 U/L (ref 0–44)
AST: 96 U/L — ABNORMAL HIGH (ref 15–41)
Albumin: 1.9 g/dL — ABNORMAL LOW (ref 3.5–5.0)
Alkaline Phosphatase: 130 U/L — ABNORMAL HIGH (ref 38–126)
Bilirubin, Direct: 7.3 mg/dL — ABNORMAL HIGH (ref 0.0–0.2)
Indirect Bilirubin: 6.4 mg/dL — ABNORMAL HIGH (ref 0.3–0.9)
Total Bilirubin: 13.7 mg/dL — ABNORMAL HIGH (ref 0.3–1.2)
Total Protein: 5.8 g/dL — ABNORMAL LOW (ref 6.5–8.1)

## 2023-05-27 LAB — URINE CULTURE: Culture: NO GROWTH

## 2023-05-27 LAB — PROTIME-INR
INR: 2 — ABNORMAL HIGH (ref 0.8–1.2)
Prothrombin Time: 23.1 seconds — ABNORMAL HIGH (ref 11.4–15.2)

## 2023-05-27 LAB — IRON AND TIBC
Iron: 108 ug/dL (ref 28–170)
Saturation Ratios: 92 % — ABNORMAL HIGH (ref 10.4–31.8)
TIBC: 117 ug/dL — ABNORMAL LOW (ref 250–450)
UIBC: 9 ug/dL

## 2023-05-27 MED ORDER — PREDNISOLONE 5 MG PO TABS
40.0000 mg | ORAL_TABLET | Freq: Every day | ORAL | Status: DC
  Administered 2023-05-28 – 2023-05-29 (×2): 40 mg via ORAL
  Filled 2023-05-27 (×5): qty 8

## 2023-05-27 MED ORDER — PREDNISOLONE 5 MG PO TABS: 5.0000 mg | ORAL_TABLET | Freq: Every day | ORAL | Status: DC

## 2023-05-27 MED ORDER — MAGNESIUM SULFATE IN D5W 1-5 GM/100ML-% IV SOLN
1.0000 g | Freq: Once | INTRAVENOUS | Status: AC
  Administered 2023-05-27: 1 g via INTRAVENOUS
  Filled 2023-05-27: qty 100

## 2023-05-27 MED ORDER — PREDNISOLONE 5 MG PO TABS: 20.0000 mg | ORAL_TABLET | Freq: Every day | ORAL | Status: DC

## 2023-05-27 MED ORDER — ALBUMIN HUMAN 25 % IV SOLN
50.0000 g | Freq: Four times a day (QID) | INTRAVENOUS | Status: AC
  Administered 2023-05-27 – 2023-05-28 (×4): 50 g via INTRAVENOUS
  Filled 2023-05-27 (×4): qty 200

## 2023-05-27 MED ORDER — PREDNISOLONE 5 MG PO TABS: 30.0000 mg | ORAL_TABLET | Freq: Every day | ORAL | Status: DC

## 2023-05-27 MED ORDER — PREDNISOLONE 5 MG PO TABS: 10.0000 mg | ORAL_TABLET | Freq: Every day | ORAL | Status: DC

## 2023-05-27 MED ORDER — BUPRENORPHINE HCL-NALOXONE HCL 8-2 MG SL SUBL
1.0000 | SUBLINGUAL_TABLET | Freq: Every day | SUBLINGUAL | Status: DC
  Administered 2023-05-27 – 2023-05-29 (×3): 1 via SUBLINGUAL
  Filled 2023-05-27 (×3): qty 1

## 2023-05-27 NOTE — Progress Notes (Signed)
Progress Note   Patient: Pamela [name] QMV:[id number] DOB: August 02, 1981 DOA: 05/25/2023     2 DOS: the patient was seen and examined on 05/27/2023   Brief hospital admission narrative: As per H&P written by Dr. Thomes Dinning on 05/25/2023  Pamela [name] is a 42 y.o. female with medical history significant of occasional tobacco use and prior alcohol abuse who presents to the emergency department due to 2-week onset of abdominal pain.  Patient complained of generalized abdominal pain which is worse in right upper quadrant, pain was rated as 6/10, it was intermittent also associated with nausea, vomiting and difficulty in being able to hold anything down without vomiting.  Patient also complained of abdominal bloating which also has been progressively worsening since onset of symptoms about 2 weeks ago.  She endorsed chronic diarrhea which started shortly after her gallbladder removal operation. She was recently seen at [facility] ED on 05/13/2023.  CT scan of her abdomen and pelvis done at that time per ED medical record showed severe hepatomegaly with fatty infiltration of the liver, esophageal and abdominal varices, ascites and mesenteric infiltration with edema per ED medical record.  She was diagnosed with UTI due to urine culture being positive for greater than 100,000 units of E. coli.  She was started on antibiotics.  She was going to be admitted, but patient left AMA due to the facility not having a gastroenterologist   ED Course:  In the emergency department, BP was 103/59, other vital signs are within normal range.  Workup in the ED showed WBC 19.5, hemoglobin 9.4, hematocrit 32.3 platelets 197, MCV 100.5.  BMP showed sodium 128, potassium 2.7, chloride 88, bicarb 30, blood glucose 103, BUN less than 5, creatinine 0.37, alcohol level less than 10, acetaminophen level less than 10, urinalysis was unimpressive for UTI. Right upper quadrant abdominal ultrasound showed cirrhotic changes of the liver  with nodularity. CT abdomen and pelvis done at [facility] on 05/13/2023 1. Severe hepatomegaly with fatty infiltration of the liver. Prominent upper abdominal varices.  2. Diffuse mesenteric infiltration likely edema. This may indicate mesenteritis. No loculated collections.  3. Colonic mucosal fat deposition is likely physiologic but could indicate early changes of inflammatory bowel disease. No evidence of bowel obstruction.  4. Small esophageal hiatal hernia.  She was treated with IV ceftriaxone, fentanyl was given and Zofran was provided.  Hospitalist was asked to admit patient for further evaluation and management.  Assessment and Plan: 1-hepatic cirrhosis and ascites -Patient findings most likely associated with decompensated alcoholic hepatitis on top of liver cirrhosis -Blood pressure is soft currently limiting paracentesis intervention -Continue supportive care, electrolyte repletion and PPI -Following recommendations by gastroenterology service patient has been started on prednisone. -Follow clinical response. -No signs of acute bleeding currently appreciated; planning for outpatient endoscopic evaluation.  2-nausea/vomiting or abdominal pain -Most likely associated with acute alcoholic hepatitis and distention from cirrhosis/ascites -Albumin has been provided -Continue as needed analgesics and antiemetics -Continue supportive care and follow GI recommendations.  3-folic acid deficiency -Elevated MCV and low folic acid appreciated -Most likely associated with history of alcohol abuse -Normal B12 -Folic acid supplementation have been started; will follow trend and response..  4-hyponatremia -In the setting of dehydration and alcohol abuse -No neurologic deficits -Continue gentle fluid resuscitation and follow electrolytes trend.  5-hypokalemia -Continue to follow electrolytes and further replete as needed -At time of admission no abnormalities appreciated on EKG. -Magnesium  within normal limits.  6-transaminitis/bilirubinemia -MELD score 32 -Associated with history of alcohol abuse  and cirrhosis -Follow GI service recommendation -MDF score 66 and MELD score 32 -Patient has been started on prednisone. -Given positive ascites request for paracentesis in place.  7-tobacco abuse -Cessation counseling provided -Nicotine patch declined at the moment.  Subjective:  Positive jaundice/icterus; oriented x 3 and reporting no nausea or vomiting.  Continues to have ongoing intermittent abdominal pain.  Physical Exam: Vitals:   05/26/23 2104 05/27/23 0509 05/27/23 1428 05/27/23 1431  BP: 97/63 (!) 95/55 (!) 88/58 (!) 91/54  Pulse: 73 61 70   Resp: 18 16 19    Temp: 98.1 F (36.7 C) 97.6 F (36.4 C) 97.7 F (36.5 C)   TempSrc: Oral Oral Oral   SpO2: 96% 96% 96%   Weight:      Height:       General exam: Alert, awake, oriented x 3; no chest pain, no nausea or vomiting.  Still complaining of abdominal distention and lower extremity swelling.  Positive jaundice/icterus appreciated on exam. Respiratory system: Clear to auscultation. Respiratory effort normal.  No using accessory muscles.  Good saturation on room air. Cardiovascular system:RRR. No rubs or gallops; no JVD. Gastrointestinal system: Abdomen is distended, diffusely tender to palpation and with signs of fluid wave consistent with ascites.  Positive bowel sounds appreciated. Central nervous system: Alert and oriented. No focal neurological deficits. Extremities: No cyanosis or clubbing; trace to 1+ edema bilaterally appreciated. Skin: No petechiae. Psychiatry: Judgement and insight appear normal. Mood & affect appropriate.   Data Reviewed: Random sodium: 15 Ferritin: 263 CBC: WBCs 16.4, hemoglobin 11.0 and platelet count 175 K Hepatic function panel: Total protein 5.8, AST 96, ALT 32, alk phos 130, total bilirubin 13.7  Family Communication: No family at bedside.  Disposition: Status is:  Inpatient Remains inpatient appropriate because: IV therapy and treatment for acute decompensated cirrhosis.   Planned Discharge Destination: Home    Time spent: 35 minutes  Author: Vassie Loll, MD 05/27/2023 6:01 PM  For on call review [email or web address].

## 2023-05-27 NOTE — Progress Notes (Addendum)
Gastroenterology Progress Note   Referring Provider: No ref. provider found Primary Care Physician:  Babs Sciara, MD Primary Gastroenterologist:  previously unassigned (Dr. Marletta Lor)  Patient ID: Pamela [name]; [id number]; Jul 12, 1981   Subjective:    Tolerating liquids. Taking only bites of solid food. Has been a long time since she has been able to eat normally. States last etoh about four weeks ago but denies heavy drinking at that time. Often took one drink a night to help sleep.  Objective:   Vital signs in last 24 hours: Temp:  [97.6 F (36.4 C)-98.2 F (36.8 C)] 97.6 F (36.4 C) (06/20 0509) Pulse Rate:  [61-84] 61 (06/20 0509) Resp:  [16-19] 16 (06/20 0509) BP: (95-104)/(55-63) 95/55 (06/20 0509) SpO2:  [96 %-98 %] 96 % (06/20 0509)   General:   Alert, chronically ill appearing. Cooperative in NAD Head:  Normocephalic and atraumatic. Eyes:  Sclera are icterus.  Abdomen:  Soft, distended. Generalized tenderness, worse ruq. Normal bowel sounds, without guarding, and without rebound.   Extremities:  Without clubbing, deformity. 1+ bilateral edema. Neurologic:  Alert and  oriented x4;  grossly normal neurologically. Skin:  Intact without significant lesions or rashes. Psych:  Alert and cooperative. Normal mood and affect.  Intake/Output from previous day: 06/19 0701 - 06/20 0700 In: 1528.3 [P.O.:240; I.V.:1288.3] Out: -  Intake/Output this shift: No intake/output data recorded.  Lab Results: CBC Recent Labs    05/25/23 1541 05/26/23 0430  WBC 19.5* 19.3*  HGB 11.4* 10.6*  HCT 32.3* 30.1*  MCV 109.5* 110.3*  PLT 187 163   BMET Recent Labs    05/25/23 1541 05/26/23 0430  NA 128* 127*  K 2.7* 2.9*  CL 88* 89*  CO2 30 29  GLUCOSE 103* 87  BUN <5* <5*  CREATININE 0.37* <0.30*  CALCIUM 7.6* 7.5*   LFTs Recent Labs    05/25/23 1541 05/26/23 0430  BILITOT 16.2* 14.1*  ALKPHOS 139* 117  AST 118* 92*  ALT 31 28  PROT 6.3* 5.8*  ALBUMIN 2.2*  1.8*   Recent Labs    05/25/23 1541  LIPASE 29   PT/INR Recent Labs    05/25/23 1541  LABPROT 24.7*  INR 2.2*         Imaging Studies: CT LIVER ABDOMEN W WO CONTRAST  Result Date: 05/26/2023 CLINICAL DATA:  Cirrhosis.  Abdominal distension. EXAM: CT ABDOMEN WITHOUT AND WITH CONTRAST TECHNIQUE: Multidetector CT imaging of the abdomen was performed following the standard protocol before and following the bolus administration of intravenous contrast. RADIATION DOSE REDUCTION: This exam was performed according to the departmental dose-optimization program which includes automated exposure control, adjustment of the mA and/or kV according to patient size and/or use of iterative reconstruction technique. CONTRAST:  80mL OMNIPAQUE IOHEXOL 300 MG/ML  SOLN COMPARISON:  CT scan 05/13/2023 and MRI 10/10/2021. FINDINGS: Lower chest: Streaky subsegmental bibasilar atelectasis but no infiltrates or effusions. The heart is normal in size. No pericardial effusion. Hepatobiliary: Stable appearing cirrhotic changes involving the liver. Heterogeneous arterial phase contrast enhancement likely due to regenerating nodules and confluent hepatic fibrosis. No arterial phase enhancing lesions to suggest dysplastic nodules or HCC. Portal venous images demonstrate scattered small low-attenuation lesions most consistent with benign hepatic cysts. The portal and hepatic veins are patent. Extensive portal venous collaterals are noted along with hepatosplenomegaly. The gallbladder is surgically absent. No common bile duct dilatation. Pancreas: No mass, inflammation or ductal dilatation. Spleen: Stable mild to moderate splenomegaly. No splenic lesions. Extensive perisplenic and perigastric  collateral vessels and splenorenal shunt. Adrenals/Urinary Tract: Adrenal glands and kidneys are unremarkable. Stomach/Bowel: The stomach, duodenum and visualized small bowel unremarkable. Colonic wall thickening and submucosal edema could  suggest colitis or may be related to low albumin and mild ascites. Recommend correlation with any colonic symptoms such as diarrhea. Vascular/Lymphatic: The aorta and branch vessels are patent. The major venous structures are patent. The portal and splenic veins are patent. Enlarged upper abdominal lymph nodes typical with cirrhosis. Other: Small volume abdominal and upper pelvic ascites. Associated mesenteric edema and mild body wall edema. Musculoskeletal: No significant bony findings. IMPRESSION: 1. 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 2. No arterial phase enhancing lesions to suggest dysplastic nodules or HCC. 3. Small volume abdominal and upper pelvic ascites. 4. Colonic wall thickening and submucosal edema could suggest colitis or may be related to low albumin and mild ascites. Recommend correlation with any colonic symptoms such as diarrhea. Electronically Signed   By: Rudie Meyer M.D.   On: 05/26/2023 14:10   [place] ASCITES (ABDOMEN LIMITED)  Result Date: 05/26/2023 CLINICAL DATA:  Ascites. EXAM: LIMITED ABDOMEN ULTRASOUND FOR ASCITES TECHNIQUE: Limited ultrasound survey for ascites was performed in all four abdominal quadrants. COMPARISON:  Right upper quadrant ultrasound 05/25/2023 FINDINGS: Limited ultrasound of 4 quadrants of the abdomen do not demonstrate significant ascites. IMPRESSION: No significant ascites seen by ultrasound Electronically Signed   By: Karen Kays M.D.   On: 05/26/2023 10:06   US ABDOMEN LIMITED RUQ (LIVER/GB)  Result Date: 05/25/2023 CLINICAL DATA:  Abdominal pain EXAM: ULTRASOUND ABDOMEN LIMITED RIGHT UPPER QUADRANT COMPARISON:  None Available. FINDINGS: Gallbladder: Surgically removed. Common bile duct: Diameter: 8.2 mm. Liver: Mild nodularity is noted. Increased echogenicity is noted. Portal vein shows no significant flow consistent with  occlusion. Other: None. IMPRESSION: Status post cholecystectomy. Cirrhotic changes of the liver with nodularity. The portal vein shows no significant flow within. This is of uncertain chronicity. CT of the abdomen with contrast (with delayed venous phase images) would be helpful for further evaluation. Electronically Signed   By: Alcide Clever M.D.   On: 05/25/2023 20:59  [2 weeks]  Assessment:    Pamela [name] is a 41 y.o. year old female with prior ETOH abuse who presented to the ED yesterday with 2 week onset of abdominal pain.  Patient reporting generalized abdominal pain though worse in RUQ, with nausea/vomiting. Recent CT A/P done at [facility] on 6/6 with concern for cirrhosis, upper abdominal varices, GI consulted for further evaluation of newly diagnosed, decompensated cirrhosis.    New findings of cirrhosis: Etiology of cirrhosis undetermined at this time, possibly alcohol in setting of previous ETOH abuse. MR Abd/MRCP in 2022 with hepatomegaly.  Recent CT on 6/6 with changes of cirrhosis, upper abdominal varices, [place] during this admission also with cirrhotic changes, portal vein showing no significant flow. CT liver/abd with contrast showing cirrhotic liver with portal venous hypertension, hepatosplenomegaly, extensive portal venous collaterals and splenorenal shunt.  Portal and hepatic veins are patent.  Colonic wall thickening and submucosal edema may be related to low albumin and mild ascites.  Ammonia WNL, acute hep panel negative. Plt count 163k. Abdomen is distended but soft, interestingly she had minimal ascites for Para on [place] this morning but reports abdomen has been very distended and tight.    She will need EGD once stable, for EV/GV screening. No evidence of variceal bleed at this time.    MELD 3.0:  32 (heavily influenced by hyperbilirubinemia in setting of alcoholic hepatitis)   Hyperbilirubinemia/elevated AST: pattern indicative of alcoholic hepatitis/cholestatic pattern. Patient  reports last etoh about four weeks ago. Documented positive etoh level in the ED at [facility] 05/13/23. Stating only moderate use at that time, a drink to help her sleep. States she previously drank daily and heavily x1 year then transitioned to occasional to moderate use. She does endorse generalized abdominal pain worse in RUQ as well as nausea and vomiting. [place] yesterday with CBD 8.2 (s/p cholecystectomy).    Etiology of elevated LFTs unclear at this time, though most consistent with etoh hepatitis superimposed on cirrhosis. Prednisolone started due to MDF is 66.5.   Anemia: history of IDA, last iron studies in October 2023 with TIBC 350, Iron 34, Iron sat 10%, ferritin 75, started on iron thereafter.  hgb on admission 11.4, down to 10.6. reports BRBPR intermittently for some time, she has darker stools in setting of PO iron, denies coffee ground emesis or hematemesis. No previous EGD/Colonoscopy. Will need EGD for EV/Gastric varice evaluation at some point, recommend colonoscopy as well for further evaluation of her IDA, colonoscopy could be done on outpatient basis unless overt bleeding or significant drop in hgb.       Plan:   Update labs.  Monitor for HE. EGD/colonoscopy as outpatient for variceal screening and evaluation of rectal bleeding/IDA. Prednisolone 40mg  daily for 28 days, then taper. Lille score on days 4/7 of prednisolone.  Daily PPI. 2 gram sodium diet.  Complete etoh cessation.  Continue oral iron.    LOS: 2 days   Leanna Battles. [place] [facility] [phone] 6/20/20248:04 AM

## 2023-05-28 ENCOUNTER — Inpatient Hospital Stay (HOSPITAL_COMMUNITY): Payer: Medicaid Other

## 2023-05-28 DIAGNOSIS — D539 Nutritional anemia, unspecified: Secondary | ICD-10-CM | POA: Diagnosis not present

## 2023-05-28 DIAGNOSIS — K7011 Alcoholic hepatitis with ascites: Secondary | ICD-10-CM | POA: Diagnosis not present

## 2023-05-28 DIAGNOSIS — K7031 Alcoholic cirrhosis of liver with ascites: Secondary | ICD-10-CM | POA: Diagnosis not present

## 2023-05-28 DIAGNOSIS — R7401 Elevation of levels of liver transaminase levels: Secondary | ICD-10-CM | POA: Diagnosis not present

## 2023-05-28 LAB — CBC
HCT: 27.6 % — ABNORMAL LOW (ref 36.0–46.0)
Hemoglobin: 9.7 g/dL — ABNORMAL LOW (ref 12.0–15.0)
MCH: 39.3 pg — ABNORMAL HIGH (ref 26.0–34.0)
MCHC: 35.1 g/dL (ref 30.0–36.0)
MCV: 111.7 fL — ABNORMAL HIGH (ref 80.0–100.0)
Platelets: 153 10*3/uL (ref 150–400)
RBC: 2.47 MIL/uL — ABNORMAL LOW (ref 3.87–5.11)
RDW: 18.9 % — ABNORMAL HIGH (ref 11.5–15.5)
WBC: 15.9 10*3/uL — ABNORMAL HIGH (ref 4.0–10.5)
nRBC: 0.2 % (ref 0.0–0.2)

## 2023-05-28 LAB — COMPREHENSIVE METABOLIC PANEL
ALT: 27 U/L (ref 0–44)
AST: 93 U/L — ABNORMAL HIGH (ref 15–41)
Albumin: 3.3 g/dL — ABNORMAL LOW (ref 3.5–5.0)
Alkaline Phosphatase: 106 U/L (ref 38–126)
Anion gap: 9 (ref 5–15)
BUN: 9 mg/dL (ref 6–20)
CO2: 24 mmol/L (ref 22–32)
Calcium: 8.3 mg/dL — ABNORMAL LOW (ref 8.9–10.3)
Chloride: 96 mmol/L — ABNORMAL LOW (ref 98–111)
Creatinine, Ser: <0.3 mg/dL — ABNORMAL LOW (ref 0.44–1.00)
Glucose, Bld: 107 mg/dL — ABNORMAL HIGH (ref 70–99)
Potassium: 3.9 mmol/L (ref 3.5–5.1)
Sodium: 129 mmol/L — ABNORMAL LOW (ref 135–145)
Total Bilirubin: 11.2 mg/dL — ABNORMAL HIGH (ref 0.3–1.2)
Total Protein: 6.5 g/dL (ref 6.5–8.1)

## 2023-05-28 LAB — MAGNESIUM: Magnesium: 2.1 mg/dL (ref 1.7–2.4)

## 2023-05-28 MED ORDER — GUAIFENESIN ER 600 MG PO TB12
600.0000 mg | ORAL_TABLET | Freq: Two times a day (BID) | ORAL | Status: DC
  Administered 2023-05-28 – 2023-05-29 (×3): 600 mg via ORAL
  Filled 2023-05-28 (×3): qty 1

## 2023-05-28 MED ORDER — MIDODRINE HCL 5 MG PO TABS
2.5000 mg | ORAL_TABLET | Freq: Three times a day (TID) | ORAL | Status: DC
  Administered 2023-05-28 – 2023-05-29 (×3): 2.5 mg via ORAL
  Filled 2023-05-28 (×3): qty 1

## 2023-05-28 NOTE — Progress Notes (Signed)
Progress Note   Patient: Pamela [name] ZOX:[id number] DOB: Apr 14, 1981 DOA: 05/25/2023     3 DOS: the patient was seen and examined on 05/28/2023   Brief hospital admission narrative: As per H&P written by Dr. Thomes Dinning on 05/25/2023  Pamela [name] is a 42 y.o. female with medical history significant of occasional tobacco use and prior alcohol abuse who presents to the emergency department due to 2-week onset of abdominal pain.  Patient complained of generalized abdominal pain which is worse in right upper quadrant, pain was rated as 6/10, it was intermittent also associated with nausea, vomiting and difficulty in being able to hold anything down without vomiting.  Patient also complained of abdominal bloating which also has been progressively worsening since onset of symptoms about 2 weeks ago.  She endorsed chronic diarrhea which started shortly after her gallbladder removal operation. She was recently seen at [facility] ED on 05/13/2023.  CT scan of her abdomen and pelvis done at that time per ED medical record showed severe hepatomegaly with fatty infiltration of the liver, esophageal and abdominal varices, ascites and mesenteric infiltration with edema per ED medical record.  She was diagnosed with UTI due to urine culture being positive for greater than 100,000 units of E. coli.  She was started on antibiotics.  She was going to be admitted, but patient left AMA due to the facility not having a gastroenterologist   ED Course:  In the emergency department, BP was 103/59, other vital signs are within normal range.  Workup in the ED showed WBC 19.5, hemoglobin 9.4, hematocrit 32.3 platelets 197, MCV 100.5.  BMP showed sodium 128, potassium 2.7, chloride 88, bicarb 30, blood glucose 103, BUN less than 5, creatinine 0.37, alcohol level less than 10, acetaminophen level less than 10, urinalysis was unimpressive for UTI. Right upper quadrant abdominal ultrasound showed cirrhotic changes of the liver  with nodularity. CT abdomen and pelvis done at [facility] on 05/13/2023 1. Severe hepatomegaly with fatty infiltration of the liver. Prominent upper abdominal varices.  2. Diffuse mesenteric infiltration likely edema. This may indicate mesenteritis. No loculated collections.  3. Colonic mucosal fat deposition is likely physiologic but could indicate early changes of inflammatory bowel disease. No evidence of bowel obstruction.  4. Small esophageal hiatal hernia.  She was treated with IV ceftriaxone, fentanyl was given and Zofran was provided.  Hospitalist was asked to admit patient for further evaluation and management.  Assessment and Plan: 1-hepatic cirrhosis and ascites -Patient findings most likely associated with decompensated alcoholic hepatitis on top of liver cirrhosis -Blood pressure is soft currently limiting paracentesis intervention -Continue supportive care, electrolyte repletion and PPI -Following recommendations by gastroenterology service patient has been started on prednisone. -Follow clinical response. -No signs of acute bleeding currently appreciated; planning for outpatient endoscopic evaluation. -Low-dose midodrine will be initiated with anticipation of adding some diuretic therapy.  2-nausea/vomiting or abdominal pain -Most likely associated with acute alcoholic hepatitis and distention from cirrhosis/ascites -Albumin has been provided -Continue as needed analgesics and antiemetics -Continue supportive care and follow GI recommendations.  3-folic acid deficiency -Elevated MCV and low folic acid appreciated -Most likely associated with history of alcohol abuse -Normal B12 -Folic acid supplementation have been started; will follow trend and response..  4-hyponatremia -In the setting of dehydration and alcohol abuse -No neurologic deficits -Continue gentle fluid resuscitation and follow electrolytes trend.  5-hypokalemia -Continue to follow electrolytes and further  replete as needed -At time of admission no abnormalities appreciated on EKG. -Magnesium within normal  limits.  6-transaminitis/bilirubinemia -MELD score 32 -Associated with history of alcohol abuse and cirrhosis -Follow GI service recommendation -MDF score 66 and MELD score 32 -Patient has been started on prednisone. -Given positive ascites request for paracentesis in place.  7-tobacco abuse -Cessation counseling provided -Nicotine patch declined at the moment.  Subjective:  Improved jaundice/icterus; no chest pain, no nausea or vomiting.  Reported intermittent abdominal discomfort and is still feeling increased abdominal girth/distention.  Trace lower extremity swelling appreciated.  No fever  Physical Exam: Vitals:   05/27/23 1431 05/27/23 2100 05/27/23 2205 05/28/23 0333  BP: (!) 91/54 95/61 102/71 107/68  Pulse:  70 61 74  Resp:  18  18  Temp:  98 F (36.7 C)  98.1 F (36.7 C)  TempSrc:      SpO2:  97%  94%  Weight:      Height:       General exam: Alert, awake, oriented x 3; no chest pain, reports no nausea or vomiting and expressed still having some intermittent abdominal discomfort and distention.  No overt bleeding.  No fever Respiratory system: Clear to auscultation. Respiratory effort normal. Cardiovascular system:RRR. No rubs or gallops. Gastrointestinal system: Abdomen is distended, no guarding, diffuse tenderness on palpation and positive bowel sounds.  Patient reported increased abdominal girth. Central nervous system: Alert and oriented. No focal neurological deficits. Extremities: No cyanosis clubbing; trace edema appreciated bilaterally. Skin: No petechiae; improvement in jaundice/icterus appreciated. Psychiatry: Judgement and insight appear normal. Mood & affect appropriate.   Data Reviewed: Comprehensive metabolic panel: Sodium 129, potassium 3.9, chloride 96, bicarb 24, BUN 9, creatinine 0.30, albumin 3.3, AST 93, ALT 27, alkaline phosphatase 106 and  total bilirubin 11.2 CBC: WBCs 15.9, hemoglobin 9.7 and platelet count 153 K Magnesium: 2.1  Family Communication: No family at bedside.  Disposition: Status is: Inpatient Remains inpatient appropriate because: IV therapy and treatment for acute decompensated cirrhosis.   Planned Discharge Destination: Home    Time spent: 35 minutes  Author: Vassie Loll, MD 05/28/2023 4:07 PM  For on call review [email or web address].

## 2023-05-28 NOTE — Plan of Care (Signed)

## 2023-05-28 NOTE — Progress Notes (Signed)
Gastroenterology Progress Note   Referring Provider: No ref. provider found Primary Care Physician:  Babs Sciara, MD Primary Gastroenterologist:  Dr. Marletta Lor  Patient ID: Pamela [name]; [id number]; 05-13-1981    Subjective   No N/, abdominal pain. Able to eat fair amount of breakfast. Has had a few loose stools that are dark but they have been given iron therapy. No vaginal bleeding or hematuria. No confusion. Did not sleep well overnight given constant IV beeping.   Mother also present at bedside this morning and offered brief overview of alcoholic hepatitis, steroid therapy, and basic cirrhosis care that will be continued outpatient as well as potential additional serological evaluation for cirrhosis.    Objective   Vital signs in last 24 hours Temp:  [97.7 F (36.5 C)-98.1 F (36.7 C)] 98.1 F (36.7 C) (06/21 0333) Pulse Rate:  [61-74] 74 (06/21 0333) Resp:  [18-19] 18 (06/21 0333) BP: (88-107)/(54-71) 107/68 (06/21 0333) SpO2:  [94 %-97 %] 94 % (06/21 0333)    Physical Exam General:   Alert, in NAD. Chronically ill appearing. Sitting up in chair Head:  Normocephalic and atraumatic. Eyes:  Scleral icterus, sclera clear.  Heart:  S1, S2 present, no murmurs noted.  Lungs: Clear to auscultation bilaterally, without wheezing, rales, or rhonchi.  Abdomen:  Bowel sounds present, soft, non-tender, mildly distended. TTP to RUQ and RLQ. Hepatomegaly noted. No rebound or guarding.  Extremities:  Without clubbing or edema. Neurologic:  Alert and  oriented x4;  grossly normal neurologically. Skin:  Mild jaundice. Warm and dry, intact without significant lesions.  Psych:  Alert and cooperative. Normal mood and affect.  Intake/Output from previous day: 06/20 0701 - 06/21 0700 In: 1194.3 [P.O.:600; I.V.:594.3] Out: -  Intake/Output this shift: No intake/output data recorded.  Lab Results  Recent Labs    05/26/23 0430 05/27/23 1157 05/28/23 0416  WBC 19.3* 16.4* 15.9*   HGB 10.6* 11.0* 9.7*  HCT 30.1* 30.6* 27.6*  PLT 163 175 153   BMET Recent Labs    05/25/23 1541 05/26/23 0430 05/28/23 0416  NA 128* 127* 129*  K 2.7* 2.9* 3.9  CL 88* 89* 96*  CO2 30 29 24   GLUCOSE 103* 87 107*  BUN <5* <5* 9  CREATININE 0.37* <0.30* <0.30*  CALCIUM 7.6* 7.5* 8.3*   LFT Recent Labs    05/26/23 0430 05/27/23 1157 05/28/23 0416  PROT 5.8* 5.8* 6.5  ALBUMIN 1.8* 1.9* 3.3*  AST 92* 96* 93*  ALT 28 32 27  ALKPHOS 117 130* 106  BILITOT 14.1* 13.7* 11.2*  BILIDIR  --  7.3*  --   IBILI  --  6.4*  --    PT/INR Recent Labs    05/25/23 1541 05/27/23 1157  LABPROT 24.7* 23.1*  INR 2.2* 2.0*   Hepatitis Panel Recent Labs    05/25/23 1653  HEPBSAG NON REACTIVE  HCVAB NON REACTIVE  HEPAIGM NON REACTIVE  HEPBIGM NON REACTIVE    Studies/Results CT LIVER ABDOMEN W WO CONTRAST  Result Date: 05/26/2023 CLINICAL DATA:  Cirrhosis.  Abdominal distension. EXAM: CT ABDOMEN WITHOUT AND WITH CONTRAST TECHNIQUE: Multidetector CT imaging of the abdomen was performed following the standard protocol before and following the bolus administration of intravenous contrast. RADIATION DOSE REDUCTION: This exam was performed according to the departmental dose-optimization program which includes automated exposure control, adjustment of the mA and/or kV according to patient size and/or use of iterative reconstruction technique. CONTRAST:  80mL OMNIPAQUE IOHEXOL 300 MG/ML  SOLN COMPARISON:  CT scan  05/13/2023 and MRI 10/10/2021. FINDINGS: Lower chest: Streaky subsegmental bibasilar atelectasis but no infiltrates or effusions. The heart is normal in size. No pericardial effusion. Hepatobiliary: Stable appearing cirrhotic changes involving the liver. Heterogeneous arterial phase contrast enhancement likely due to regenerating nodules and confluent hepatic fibrosis. No arterial phase enhancing lesions to suggest dysplastic nodules or HCC. Portal venous images demonstrate scattered  small low-attenuation lesions most consistent with benign hepatic cysts. The portal and hepatic veins are patent. Extensive portal venous collaterals are noted along with hepatosplenomegaly. The gallbladder is surgically absent. No common bile duct dilatation. Pancreas: No mass, inflammation or ductal dilatation. Spleen: Stable mild to moderate splenomegaly. No splenic lesions. Extensive perisplenic and perigastric collateral vessels and splenorenal shunt. Adrenals/Urinary Tract: Adrenal glands and kidneys are unremarkable. Stomach/Bowel: The stomach, duodenum and visualized small bowel unremarkable. Colonic wall thickening and submucosal edema could suggest colitis or may be related to low albumin and mild ascites. Recommend correlation with any colonic symptoms such as diarrhea. Vascular/Lymphatic: The aorta and branch vessels are patent. The major venous structures are patent. The portal and splenic veins are patent. Enlarged upper abdominal lymph nodes typical with cirrhosis. Other: Small volume abdominal and upper pelvic ascites. Associated mesenteric edema and mild body wall edema. Musculoskeletal: No significant bony findings. IMPRESSION: 1. 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 2. No arterial phase enhancing lesions to suggest dysplastic nodules or HCC. 3. Small volume abdominal and upper pelvic ascites. 4. Colonic wall thickening and submucosal edema could suggest colitis or may be related to low albumin and mild ascites. Recommend correlation with any colonic symptoms such as diarrhea. Electronically Signed   By: Rudie Meyer M.D.   On: 05/26/2023 14:10   [place] ASCITES (ABDOMEN LIMITED)  Result Date: 05/26/2023 CLINICAL DATA:  Ascites. EXAM: LIMITED ABDOMEN ULTRASOUND FOR ASCITES TECHNIQUE: Limited ultrasound survey for ascites was performed in all four abdominal quadrants.  COMPARISON:  Right upper quadrant ultrasound 05/25/2023 FINDINGS: Limited ultrasound of 4 quadrants of the abdomen do not demonstrate significant ascites. IMPRESSION: No significant ascites seen by ultrasound Electronically Signed   By: Karen Kays M.D.   On: 05/26/2023 10:06   US ABDOMEN LIMITED RUQ (LIVER/GB)  Result Date: 05/25/2023 CLINICAL DATA:  Abdominal pain EXAM: ULTRASOUND ABDOMEN LIMITED RIGHT UPPER QUADRANT COMPARISON:  None Available. FINDINGS: Gallbladder: Surgically removed. Common bile duct: Diameter: 8.2 mm. Liver: Mild nodularity is noted. Increased echogenicity is noted. Portal vein shows no significant flow consistent with occlusion. Other: None. IMPRESSION: Status post cholecystectomy. Cirrhotic changes of the liver with nodularity. The portal vein shows no significant flow within. This is of uncertain chronicity. CT of the abdomen with contrast (with delayed venous phase images) would be helpful for further evaluation. Electronically Signed   By: Alcide Clever M.D.   On: 05/25/2023 20:59    Assessment  42 y.o. female with a history of prior EtOH abuse who presented to the ED 6/18 with 2 weeks of abdominal pain primarily in the RUQ associated with N/V. CT A/P done at [facility] 6/6 with concern for cirrhosis, upper abdominal varices. GI consulted for further evaluation of newly diagnosed decompensated cirrhosis.   Cirrhosis: Newly diagnosed.  Etiology likely previous alcohol abuse however not all etiologies ruled out. (MRCP in November 2022 with severe hepatic steatosis and hepatomegaly present).  CT on 6/6 noting cirrhosis and upper abdominal varices.  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  Viral hepatitis panel negative, HIV negative, ammonia  WNL.  No thrombocytopenia currently however they are slowly downtrending.  Mild ascites on yesterday's abdominal ultrasound.  Patient reported to me today that when she came in her abdomen was distended to where she felt like she was 9 months pregnant but she reports significant improvement today and although she remains distended her abdomen is soft and with minimal ascites on imaging yesterday I did not feel as though paracentesis is needed.  She will likely need additional serologic workup for etiology of cirrhosis on outpatient basis as well as EGD for screening for esophageal and gastric varices. Discussed expectations regarding  outpatient cirrhosis care/monitoring and further evaluation with patient and her mother at the bedside today. All questions answered.   MELD 3.0: 30 (secondary to hyperbilirubinemia)  Hyperbilirubinemia, elevated AST, Alcoholic Hepatitis: Elevated AST and significant hyperbilirubinemia this admission consistent with alcoholic hepatitis.  Previously reported heavy alcohol use after the death of her daughter however recently has only reported moderate use and a drink nightly to help her sleep.  Reported last drink was 4 weeks ago.  Presented with significant generalized abdominal pain worse in the RUQ with nausea and vomiting.  Pain and abdominal distention are improving and she has no longer had any nausea or vomiting and is able to to tolerate meals.  Abdominal ultrasound with CBD measuring 8.2 mm in the setting of cholecystectomy, minimal ascites.  T. Bili as high as 16.2 this admission but improving. MDF calculated on 6/19 was 66.5 with MELD 32 and she was started on prednisolone 40 mg daily for 28 days then will taper. (Currently on day 3 of therapy). Will need to calculate Lille score tomorrow and on day 7 (6/25).   Anemia: Iron studies 34, saturation 10%, and ferritin hemoglobin this admission initially 11.4 and has been slowly downtrending, currently 9.7.  This is likely  somewhat hemodilutional given she has been receiving IV fluids and albumin.  She previously reported some intermittent bright red blood and darker stools in the setting of iron therapy.  She has denied any hematemesis or coffee-ground emesis this admission or recently.  As noted above she will need outpatient EGD for screening for esophageal and gastric varices and colonoscopy for further evaluation of IDA.  She denies any vaginal bleeding or hematuria for me today.  Plan / Recommendations  Daily CBC, CMP, INR Prednisolone 40 mg daily for 28 days then taper Lille score tomorrow and 6/25 PPI daily 2g Na diet Complete EtoH cessation Continue iron therapy daily EGD/TCS outpatient (variceal screening, IDA, rectal bleeding) Will need to establish cirrhosis care outpatient and will perform additional serologic workup for cirrhosis.     LOS: 3 days   05/28/2023, 8:59 AM   Brooke Bonito, MSN, FNP-BC, AGACNP-BC [facility]

## 2023-05-29 ENCOUNTER — Telehealth: Payer: Self-pay | Admitting: Gastroenterology

## 2023-05-29 DIAGNOSIS — K7031 Alcoholic cirrhosis of liver with ascites: Secondary | ICD-10-CM | POA: Diagnosis not present

## 2023-05-29 DIAGNOSIS — I959 Hypotension, unspecified: Secondary | ICD-10-CM

## 2023-05-29 DIAGNOSIS — R7401 Elevation of levels of liver transaminase levels: Secondary | ICD-10-CM | POA: Diagnosis not present

## 2023-05-29 DIAGNOSIS — K7011 Alcoholic hepatitis with ascites: Secondary | ICD-10-CM | POA: Diagnosis not present

## 2023-05-29 DIAGNOSIS — E611 Iron deficiency: Secondary | ICD-10-CM

## 2023-05-29 DIAGNOSIS — E871 Hypo-osmolality and hyponatremia: Secondary | ICD-10-CM | POA: Diagnosis not present

## 2023-05-29 DIAGNOSIS — E876 Hypokalemia: Secondary | ICD-10-CM | POA: Diagnosis not present

## 2023-05-29 LAB — PROTIME-INR
INR: 2 — ABNORMAL HIGH (ref 0.8–1.2)
Prothrombin Time: 23.2 seconds — ABNORMAL HIGH (ref 11.4–15.2)

## 2023-05-29 LAB — CBC
HCT: 29.3 % — ABNORMAL LOW (ref 36.0–46.0)
Hemoglobin: 10.1 g/dL — ABNORMAL LOW (ref 12.0–15.0)
MCH: 39.1 pg — ABNORMAL HIGH (ref 26.0–34.0)
MCHC: 34.5 g/dL (ref 30.0–36.0)
MCV: 113.6 fL — ABNORMAL HIGH (ref 80.0–100.0)
Platelets: 178 10*3/uL (ref 150–400)
RBC: 2.58 MIL/uL — ABNORMAL LOW (ref 3.87–5.11)
RDW: 19 % — ABNORMAL HIGH (ref 11.5–15.5)
WBC: 24.9 10*3/uL — ABNORMAL HIGH (ref 4.0–10.5)
nRBC: 0.2 % (ref 0.0–0.2)

## 2023-05-29 LAB — COMPREHENSIVE METABOLIC PANEL
ALT: 39 U/L (ref 0–44)
AST: 164 U/L — ABNORMAL HIGH (ref 15–41)
Albumin: 3.9 g/dL (ref 3.5–5.0)
Alkaline Phosphatase: 98 U/L (ref 38–126)
Anion gap: 11 (ref 5–15)
BUN: 10 mg/dL (ref 6–20)
CO2: 21 mmol/L — ABNORMAL LOW (ref 22–32)
Calcium: 8.6 mg/dL — ABNORMAL LOW (ref 8.9–10.3)
Chloride: 98 mmol/L (ref 98–111)
Creatinine, Ser: 0.43 mg/dL — ABNORMAL LOW (ref 0.44–1.00)
GFR, Estimated: >60 mL/min (ref >60)
Glucose, Bld: 116 mg/dL — ABNORMAL HIGH (ref 70–99)
Potassium: 4 mmol/L (ref 3.5–5.1)
Sodium: 130 mmol/L — ABNORMAL LOW (ref 135–145)
Total Bilirubin: 15.2 mg/dL — ABNORMAL HIGH (ref 0.3–1.2)
Total Protein: 7 g/dL (ref 6.5–8.1)

## 2023-05-29 MED ORDER — PANTOPRAZOLE SODIUM 40 MG PO TBEC: 40.0000 mg | DELAYED_RELEASE_TABLET | Freq: Every day | ORAL | 1 refills | Status: DC

## 2023-05-29 MED ORDER — FOLIC ACID 1 MG PO TABS: 1.0000 mg | ORAL_TABLET | Freq: Every day | ORAL | 1 refills | Status: DC

## 2023-05-29 MED ORDER — ONDANSETRON 8 MG PO TBDP: 8.0000 mg | ORAL_TABLET | Freq: Three times a day (TID) | ORAL | 0 refills | Status: DC | PRN

## 2023-05-29 MED ORDER — IRON (FERROUS SULFATE) 325 (65 FE) MG PO TABS
325.0000 mg | ORAL_TABLET | Freq: Every day | ORAL | 1 refills | Status: DC
Start: 2023-05-29 — End: 2023-08-04

## 2023-05-29 MED ORDER — MIDODRINE HCL 2.5 MG PO TABS: 2.5000 mg | ORAL_TABLET | Freq: Three times a day (TID) | ORAL | 1 refills | Status: DC

## 2023-05-29 NOTE — Progress Notes (Signed)
I reviewed the results of the most recent blood workup. 4 day Short Lille score was 0.627.  Patient may be discharged without prednisolone.  She will need a repeat CBC, CMP and INR in 1 week and close follow-up in 1 to 2 weeks.

## 2023-05-29 NOTE — Discharge Summary (Signed)
Physician Discharge Summary   Patient: Pamela [name] MRN: [id number] DOB: Mar 07, 1981  Admit date:     05/25/2023  Discharge date: 05/29/23  Discharge Physician: Vassie Loll   PCP: Babs Sciara, MD   Recommendations at discharge:  Repeat complete metabolic panel to follow electrolytes, LFTs and renal function Reassess blood pressure and if needed further adjust midodrine dosage. Make sure patient has follow-up with gastroenterology service for further evaluation and management -Continue assisting patient with complete alcohol cessation and abstinence.  Discharge Diagnoses: Principal Problem:   Hepatic cirrhosis (HCC) Active Problems:   Hypokalemia   Abdominal pain   Alcoholic hepatitis with ascites   Nausea & vomiting   Hyponatremia   Macrocytic anemia   Transaminitis   Iron deficiency  As per H&P written by Dr. Thomes Dinning on 05/25/2023  Pamela [name] is a 42 y.o. female with medical history significant of occasional tobacco use and prior alcohol abuse who presents to the emergency department due to 2-week onset of abdominal pain.  Patient complained of generalized abdominal pain which is worse in right upper quadrant, pain was rated as 6/10, it was intermittent also associated with nausea, vomiting and difficulty in being able to hold anything down without vomiting.  Patient also complained of abdominal bloating which also has been progressively worsening since onset of symptoms about 2 weeks ago.  She endorsed chronic diarrhea which started shortly after her gallbladder removal operation. She was recently seen at [facility] ED on 05/13/2023.  CT scan of her abdomen and pelvis done at that time per ED medical record showed severe hepatomegaly with fatty infiltration of the liver, esophageal and abdominal varices, ascites and mesenteric infiltration with edema per ED medical record.  She was diagnosed with UTI due to urine culture being positive for greater than 100,000 units of  E. coli.  She was started on antibiotics.  She was going to be admitted, but patient left AMA due to the facility not having a gastroenterologist   ED Course:  In the emergency department, BP was 103/59, other vital signs are within normal range.  Workup in the ED showed WBC 19.5, hemoglobin 9.4, hematocrit 32.3 platelets 197, MCV 100.5.  BMP showed sodium 128, potassium 2.7, chloride 88, bicarb 30, blood glucose 103, BUN less than 5, creatinine 0.37, alcohol level less than 10, acetaminophen level less than 10, urinalysis was unimpressive for UTI. Right upper quadrant abdominal ultrasound showed cirrhotic changes of the liver with nodularity. CT abdomen and pelvis done at [facility] on 05/13/2023 1. Severe hepatomegaly with fatty infiltration of the liver. Prominent upper abdominal varices.  2. Diffuse mesenteric infiltration likely edema. This may indicate mesenteritis. No loculated collections.  3. Colonic mucosal fat deposition is likely physiologic but could indicate early changes of inflammatory bowel disease. No evidence of bowel obstruction.  4. Small esophageal hiatal hernia.  She was treated with IV ceftriaxone, fentanyl was given and Zofran was provided.  Hospitalist was asked to admit patient for further evaluation and management.  Assessment and Plan: 1-hepatic cirrhosis and ascites -Patient findings most likely associated with decompensated alcoholic hepatitis on top of liver cirrhosis -Blood pressure is soft currently limiting paracentesis intervention -Continue supportive care and PPI. -Following recommendations by gastroenterology service patient has been discharged home without prednisolone tapering given Lille score of 0.627 -Patient will follow-up with GI as an outpatient; there are plans for endoscopic evaluation after follow-up visit. -Low-dose midodrine will be initiated with anticipation of adding some diuretic therapy.   2-nausea/vomiting or abdominal  pain -Most likely  associated with acute alcoholic hepatitis and distention from cirrhosis/ascites -Albumin has been provided -Continue as needed analgesics and antiemetics -Continue supportive care and follow GI recommendations.   3-folic acid deficiency -Elevated MCV and low folic acid appreciated -Most likely associated with history of alcohol abuse -Normal B12 -Folic acid supplementation have been started; continue to follow trend, response and need for future supplementation.   4-hyponatremia -In the setting of dehydration and alcohol abuse -No neurologic deficits -Continue to maintain adequate hydration.   5-hypokalemia -Continue to follow electrolytes and further replete as needed -At time of admission no abnormalities appreciated on EKG. -Magnesium within normal limits. -Potassium within normal limits at discharge.   6-transaminitis/bilirubinemia -Associated with history of alcohol abuse and cirrhosis -MDF score 66 and MELD score 32 -Patient was started on prednisolone while inpatient; after 4 days treatment Lille score was 0.627 and safe to discharge off steroids as per GI rec's. -Given positive ascites US paracentesis requested, but not enough fluid found to tap.   7-tobacco abuse -Cessation counseling provided -Nicotine patch declined at the moment.  Consultants: Gastroenterology service Procedures performed: See below for x-ray reports Disposition: Home Diet recommendation: Low-sodium diet.  DISCHARGE MEDICATION: Allergies as of 05/29/2023       Reactions   Zomig [zolmitriptan] Shortness Of Breath, Swelling   Imitrex is tolerated well        Medication List     TAKE these medications    folic acid 1 MG tablet Commonly known as: FOLVITE Take 1 tablet (1 mg total) by mouth daily. Start taking on: May 30, 2023   gabapentin 100 MG capsule Commonly known as: NEURONTIN Take 100 mg by mouth 3 (three) times daily as needed (pain).   Iron (Ferrous Sulfate) 325 (65 Fe) MG  Tabs Take 325 mg by mouth daily.   midodrine 2.5 MG tablet Commonly known as: PROAMATINE Take 1 tablet (2.5 mg total) by mouth 3 (three) times daily with meals.   ondansetron 8 MG disintegrating tablet Commonly known as: ZOFRAN-ODT Take 1 tablet (8 mg total) by mouth every 8 (eight) hours as needed for nausea or vomiting.   pantoprazole 40 MG tablet Commonly known as: Protonix Take 1 tablet (40 mg total) by mouth daily.   Suboxone 8-2 MG Film Generic drug: Buprenorphine HCl-Naloxone HCl Take 1 Film by mouth daily.        Follow-up Information     Luking, Jonna Coup, MD. Schedule an appointment as soon as possible for a visit in 2 week(s).   Specialty: Family Medicine Contact information: [address] [place] [address] [phone]                Discharge Exam: Ceasar Mons Weights   05/25/23 1529 05/25/23 2152  Weight: 61.2 kg 59.8 kg   General exam: Alert, awake, oriented x 3; no chest pain, reports no nausea or vomiting and expressed still having some intermittent abdominal discomfort and distention.  Patient is afebrile and has demonstrated improvement in jaundice/icterus. Respiratory system: Clear to auscultation. Respiratory effort normal.  No requiring oxygen supplementation.  No using accessory muscles. Cardiovascular system:RRR. No rubs or gallops.  No JVD. Gastrointestinal system: Abdomen is mildly distended, no guarding, vague mild diffuse tenderness on palpation and positive bowel sounds.   Central nervous system: Alert and oriented. No focal neurological deficits. Extremities: No cyanosis clubbing; trace edema appreciated bilaterally. Skin: No petechiae Psychiatry: Judgement and insight appear normal. Mood & affect appropriate.   Condition at discharge:  Stable and improved.  The results of significant diagnostics from this hospitalization (including imaging, microbiology, ancillary and laboratory) are listed below for reference.   Imaging  Studies: [place] ASCITES (ABDOMEN LIMITED)  Result Date: 05/28/2023 CLINICAL DATA:  Ascites EXAM: LIMITED ABDOMEN ULTRASOUND FOR ASCITES TECHNIQUE: Limited ultrasound survey for ascites was performed in all four abdominal quadrants. COMPARISON:  05/26/2023 CT FINDINGS: Scanning in all 4 quadrants shows minimal Peri hepatic fluid. These findings are similar to that seen on prior CT examination. IMPRESSION: Minimal ascites. Electronically Signed   By: Alcide Clever M.D.   On: 05/28/2023 09:52   CT LIVER ABDOMEN W WO CONTRAST  Result Date: 05/26/2023 CLINICAL DATA:  Cirrhosis.  Abdominal distension. EXAM: CT ABDOMEN WITHOUT AND WITH CONTRAST TECHNIQUE: Multidetector CT imaging of the abdomen was performed following the standard protocol before and following the bolus administration of intravenous contrast. RADIATION DOSE REDUCTION: This exam was performed according to the departmental dose-optimization program which includes automated exposure control, adjustment of the mA and/or kV according to patient size and/or use of iterative reconstruction technique. CONTRAST:  80mL OMNIPAQUE IOHEXOL 300 MG/ML  SOLN COMPARISON:  CT scan 05/13/2023 and MRI 10/10/2021. FINDINGS: Lower chest: Streaky subsegmental bibasilar atelectasis but no infiltrates or effusions. The heart is normal in size. No pericardial effusion. Hepatobiliary: Stable appearing cirrhotic changes involving the liver. Heterogeneous arterial phase contrast enhancement likely due to regenerating nodules and confluent hepatic fibrosis. No arterial phase enhancing lesions to suggest dysplastic nodules or HCC. Portal venous images demonstrate scattered small low-attenuation lesions most consistent with benign hepatic cysts. The portal and hepatic veins are patent. Extensive portal venous collaterals are noted along with hepatosplenomegaly. The gallbladder is surgically absent. No common bile duct dilatation. Pancreas: No mass, inflammation or ductal dilatation.  Spleen: Stable mild to moderate splenomegaly. No splenic lesions. Extensive perisplenic and perigastric collateral vessels and splenorenal shunt. Adrenals/Urinary Tract: Adrenal glands and kidneys are unremarkable. Stomach/Bowel: The stomach, duodenum and visualized small bowel unremarkable. Colonic wall thickening and submucosal edema could suggest colitis or may be related to low albumin and mild ascites. Recommend correlation with any colonic symptoms such as diarrhea. Vascular/Lymphatic: The aorta and branch vessels are patent. The major venous structures are patent. The portal and splenic veins are patent. Enlarged upper abdominal lymph nodes typical with cirrhosis. Other: Small volume abdominal and upper pelvic ascites. Associated mesenteric edema and mild body wall edema. Musculoskeletal: No significant bony findings. IMPRESSION: 1. 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 2. No arterial phase enhancing lesions to suggest dysplastic nodules or HCC. 3. Small volume abdominal and upper pelvic ascites. 4. Colonic wall thickening and submucosal edema could suggest colitis or may be related to low albumin and mild ascites. Recommend correlation with any colonic symptoms such as diarrhea. Electronically Signed   By: Rudie Meyer M.D.   On: 05/26/2023 14:10   [place] ASCITES (ABDOMEN LIMITED)  Result Date: 05/26/2023 CLINICAL DATA:  Ascites. EXAM: LIMITED ABDOMEN ULTRASOUND FOR ASCITES TECHNIQUE: Limited ultrasound survey for ascites was performed in all four abdominal quadrants. COMPARISON:  Right upper quadrant ultrasound 05/25/2023 FINDINGS: Limited ultrasound of 4 quadrants of the abdomen do not demonstrate significant ascites. IMPRESSION: No significant ascites seen by ultrasound Electronically Signed   By: Karen Kays M.D.   On: 05/26/2023 10:06   US ABDOMEN LIMITED RUQ  (LIVER/GB)  Result Date: 05/25/2023 CLINICAL DATA:  Abdominal pain EXAM: ULTRASOUND ABDOMEN LIMITED RIGHT UPPER QUADRANT COMPARISON:  None Available. FINDINGS: Gallbladder: Surgically removed. Common bile duct: Diameter: 8.2 mm. Liver: Mild nodularity is noted. Increased echogenicity is noted. Portal vein shows no significant flow consistent with occlusion. Other: None. IMPRESSION: Status post cholecystectomy. Cirrhotic changes of the liver with nodularity. The portal vein shows no significant flow within. This is of uncertain chronicity. CT of the abdomen with contrast (with delayed venous phase images) would be helpful for further evaluation. Electronically Signed   By: Alcide Clever M.D.   On: 05/25/2023 20:59    Microbiology: Results for orders placed or performed during the hospital encounter of 05/25/23  Urine Culture     Status: None   Collection Time: 05/25/23  5:07 PM   Specimen: Urine, Clean Catch  Result Value Ref Range Status   Specimen Description   Final    URINE, CLEAN CATCH Performed at [facility], [address] [place], [place] [address]    Special Requests   Final    NONE Performed at [facility], [address], [place], [place] [address]    Culture   Final    NO GROWTH Performed at [facility], [address], [place], [place] [address]    Report Status 05/27/2023 FINAL  Final    Labs: CBC: Recent Labs  Lab 05/25/23 1541 05/26/23 0430 05/27/23 1157 05/28/23 0416 05/29/23 1024  WBC 19.5* 19.3* 16.4* 15.9* 24.9*  NEUTROABS  --   --  13.9*  --   --   HGB 11.4* 10.6* 11.0* 9.7* 10.1*  HCT 32.3* 30.1* 30.6* 27.6* 29.3*  MCV 109.5* 110.3* 109.7* 111.7* 113.6*  PLT 187 163 175 153 178   Basic Metabolic Panel: Recent Labs  Lab 05/25/23 1541 05/26/23 0430 05/28/23 0416 05/29/23 1000  NA 128* 127* 129* 130*  K 2.7* 2.9* 3.9 4.0  CL 88* 89* 96* 98  CO2 30 29 24  21*  GLUCOSE 103* 87 107* 116*  BUN <5* <5* 9 10  CREATININE 0.37* <0.30* <0.30*  0.43*  CALCIUM 7.6* 7.5* 8.3* 8.6*  MG  --  1.7 2.1  --   PHOS  --  2.4*  --   --    Liver Function Tests: Recent Labs  Lab 05/25/23 1541 05/26/23 0430 05/27/23 1157 05/28/23 0416 05/29/23 1000  AST 118* 92* 96* 93* 164*  ALT 31 28 32 27 39  ALKPHOS 139* 117 130* 106 98  BILITOT 16.2* 14.1* 13.7* 11.2* 15.2*  PROT 6.3* 5.8* 5.8* 6.5 7.0  ALBUMIN 2.2* 1.8* 1.9* 3.3* 3.9   CBG: No results for input(s): "GLUCAP" in the last 168 hours.  Discharge time spent: greater than 30 minutes.  Signed: Vassie Loll, MD [facility] 05/29/2023

## 2023-05-29 NOTE — Progress Notes (Signed)
Discharge instructions reviewed with patient and mother.  Scripts sent to [facility].  To follow up with primary and gastro will call patient for follow up and labs

## 2023-05-29 NOTE — Progress Notes (Signed)
Pamela [name], M.D. Gastroenterology & Hepatology   Interval History:  Patient reports feeling better than before but still with abdominal distention and LE edema.  No nausea or vomiting but not having too much appetite.  No melena, hematochezia, fevers, chills, abdominal pain. Unfortunately, no labs could be drawn yesterday evening and she refused to have blood drawn today in the morning.  I spoke with her and she is now agreeable to have the blood drawn.  Inpatient Medications:  Current Facility-Administered Medications:    0.9 % NaCl with KCl 40 mEq / L  infusion, , Intravenous, Continuous, Madueme, Harriet Masson, RPH, Last Rate: 75 mL/hr at 05/28/23 2034, New Bag at 05/28/23 2034   buprenorphine-naloxone (SUBOXONE) 8-2 mg per SL tablet 1 tablet, 1 tablet, Sublingual, Daily, Vassie Loll, MD, 1 tablet at 05/29/23 0859   ferrous sulfate tablet 325 mg, 325 mg, Oral, Daily, Adefeso, Oladapo, DO, 325 mg at 05/29/23 0815   folic acid (FOLVITE) tablet 1 mg, 1 mg, Oral, Daily, Vassie Loll, MD, 1 mg at 05/29/23 0816   guaiFENesin (MUCINEX) 12 hr tablet 600 mg, 600 mg, Oral, BID, Vassie Loll, MD, 600 mg at 05/29/23 0817   guaiFENesin-dextromethorphan (ROBITUSSIN DM) 100-10 MG/5ML syrup 5 mL, 5 mL, Oral, Q4H PRN, Adefeso, Oladapo, DO, 5 mL at 05/29/23 0735   HYDROmorphone (DILAUDID) injection 0.5 mg, 0.5 mg, Intravenous, Q3H PRN, Adefeso, Oladapo, DO, 0.5 mg at 05/29/23 0735   midodrine (PROAMATINE) tablet 2.5 mg, 2.5 mg, Oral, TID WC, Vassie Loll, MD, 2.5 mg at 05/29/23 0817   ondansetron (ZOFRAN) tablet 4 mg, 4 mg, Oral, Q6H PRN, 4 mg at 05/26/23 0108 **OR** ondansetron (ZOFRAN) injection 4 mg, 4 mg, Intravenous, Q6H PRN, Adefeso, Oladapo, DO, 4 mg at 05/27/23 0658   pantoprazole (PROTONIX) injection 40 mg, 40 mg, Intravenous, Q12H, Vassie Loll, MD, 40 mg at 05/29/23 1610   prednisoLONE tablet 40 mg, 40 mg, Oral, Daily, 40 mg at 05/29/23 0818 **FOLLOWED BY** [START ON 06/23/2023]  prednisoLONE tablet 30 mg, 30 mg, Oral, Daily **FOLLOWED BY** [START ON 06/30/2023] prednisoLONE tablet 20 mg, 20 mg, Oral, Daily **FOLLOWED BY** [START ON 07/07/2023] prednisoLONE tablet 10 mg, 10 mg, Oral, Daily **FOLLOWED BY** [START ON 07/14/2023] prednisoLONE tablet 5 mg, 5 mg, Oral, Daily, Tana Coast S, PA-C   prochlorperazine (COMPAZINE) injection 10 mg, 10 mg, Intravenous, Q6H PRN, Adefeso, Oladapo, DO, 10 mg at 05/27/23 2159   I/O    Intake/Output Summary (Last 24 hours) at 05/29/2023 0902 Last data filed at 05/28/2023 1826 Gross per 24 hour  Intake 120 ml  Output --  Net 120 ml     Physical Exam: Temp:  [98.1 F (36.7 C)-98.6 F (37 C)] 98.6 F (37 C) (06/22 0442) Pulse Rate:  [66-78] 78 (06/22 0442) Resp:  [16-17] 16 (06/22 0442) BP: (103-107)/(59-67) 107/67 (06/22 0442) SpO2:  [96 %] 96 % (06/22 0442)  Temp (24hrs), Avg:98.4 F (36.9 C), Min:98.1 F (36.7 C), Max:98.6 F (37 C) GENERAL: The patient is AO x3, in no acute distress. HEENT: Head is normocephalic and atraumatic. EOMI are intact. Mouth is well hydrated and without lesions. Icteric sclerae. NECK: Supple. No masses LUNGS: Clear to auscultation. No presence of rhonchi/wheezing/rales. Adequate chest expansion HEART: RRR, normal s1 and s2. ABDOMEN: Soft, nontender, no guarding, no peritoneal signs, and nondistended. BS +. No masses. EXTREMITIES: Without any cyanosis, clubbing, rash, lesions or edema. NEUROLOGIC: AOx3, no focal motor deficit. SKIN: generalized jaundice, no rashes  Laboratory Data: CBC:     Component  Value Date/Time   WBC 15.9 (H) 05/28/2023 0416   RBC 2.47 (L) 05/28/2023 0416   HGB 9.7 (L) 05/28/2023 0416   HGB 12.7 09/17/2022 1028   HCT 27.6 (L) 05/28/2023 0416   HCT 38.3 09/17/2022 1028   PLT 153 05/28/2023 0416   PLT 205 09/17/2022 1028   MCV 111.7 (H) 05/28/2023 0416   MCV 94 09/17/2022 1028   MCH 39.3 (H) 05/28/2023 0416   MCHC 35.1 05/28/2023 0416   RDW 18.9 (H) 05/28/2023 0416    RDW 14.4 09/17/2022 1028   LYMPHSABS 1.7 05/27/2023 1157   LYMPHSABS 2.8 09/17/2022 1028   MONOABS 0.6 05/27/2023 1157   EOSABS 0.0 05/27/2023 1157   EOSABS 0.2 09/17/2022 1028   BASOSABS 0.0 05/27/2023 1157   BASOSABS 0.1 09/17/2022 1028   COAG:  Lab Results  Component Value Date   INR 2.0 (H) 05/27/2023   INR 2.2 (H) 05/25/2023    BMP:     Latest Ref Rng & Units 05/28/2023    4:16 AM 05/26/2023    4:30 AM 05/25/2023    3:41 PM  BMP  Glucose 70 - 99 mg/dL 440  87  347   BUN 6 - 20 mg/dL 9  <5  <5   Creatinine 0.44 - 1.00 mg/dL <4.25  <9.56  3.87   Sodium 135 - 145 mmol/L 129  127  128   Potassium 3.5 - 5.1 mmol/L 3.9  2.9  2.7   Chloride 98 - 111 mmol/L 96  89  88   CO2 22 - 32 mmol/L 24  29  30    Calcium 8.9 - 10.3 mg/dL 8.3  7.5  7.6     HEPATIC:     Latest Ref Rng & Units 05/28/2023    4:16 AM 05/27/2023   11:57 AM 05/26/2023    4:30 AM  Hepatic Function  Total Protein 6.5 - 8.1 g/dL 6.5  5.8  5.8   Albumin 3.5 - 5.0 g/dL 3.3  1.9  1.8   AST 15 - 41 U/L 93  96  92   ALT 0 - 44 U/L 27  32  28   Alk Phosphatase 38 - 126 U/L 106  130  117   Total Bilirubin 0.3 - 1.2 mg/dL 56.4  33.2  95.1   Bilirubin, Direct 0.0 - 0.2 mg/dL  7.3      CARDIAC: No results found for: "CKTOTAL", "CKMB", "CKMBINDEX", "TROPONINI"    Imaging: I personally reviewed and interpreted the available labs, imaging and endoscopic files.   Assessment/Plan: 42 y/o F with PMH depression, migraines, alcohol abuse and newly diagnosed alcoholic cirrhosis, who was admitted to the hospital after presenting recurrent abdominal pain with nausea and vomiting and generalized jaundice.  Patient was found to have newly diagnosed cirrhosis during the current hospitalization in the setting of active alcohol use.  Current presentation is consistent with acute alcoholic hepatitis in the setting of decompensated liver cirrhosis given the presence of anasarca.  She was started on prednisolone with improvement of her  LFTs but no labs were available today to calculate as short Lille score -will try to obtain this today.  Pending results, she may go home with prednisolone and taper.  I had a very thorough discussion with the patient and her mother regarding the importance of abstaining from alcohol use and enrolling in a rehab program.  Patient stated that she understood this.  In terms of the complications of liver cirrhosis, she has never had an EGD  in the past.  Has presented anemia which slowly down trended but without overt gastrointestinal bleeding.  We will plan for an outpatient EGD and colonoscopy for now.  Most recent abdominal ultrasound did not show any significant ascites although there was some trace component.  Most of the abdominal distention and third spacing is related to anasarca in the abdominal wall.  Ideally, these will be managed with diuresis and sodium restriction but she has presence of significant hyponatremia.  If her sodium improves, can consider discharging her on a low-dose furosemide and spironolactone combination with repeat BMP in a week.  Finally, I spoke with the mother about her mental status.  She reports that she has presented some previous episodes of confusion in the past but no encephalopathy at the moment.  I asked her to keep [place] updated if she presents forgetfulness or changes in her mental status, may need to start her on lactulose at that time.  Daily CBC, CMP, INR Prednisolone 40 mg daily for 28 days then taper 4-day Lille score today pending labs PPI daily <2g Na diet Complete EtoH cessation, consider enrollment in AA or support group Continue iron therapy daily EGD/TCS outpatient (variceal screening, IDA, rectal bleeding) Will need to establish cirrhosis care outpatient and will perform additional serologic workup for cirrhosis.  May consider starting lactulose if presenting recurrent forgetfulness or changes in mental status.  Pamela Blazing, MD Gastroenterology  and Hepatology [facility]enterology

## 2023-05-29 NOTE — Progress Notes (Signed)
Pt discharged via WC to POV by Myrtis Hopping, RN.

## 2023-05-29 NOTE — Telephone Encounter (Signed)
Hi Mitzie,  Can you please schedule a follow up appointment for this patient in 1-2 weeks with Dr. Marletta Lor or any of the APPs?  Crystal, please send an order for repeat CBC, CMP and INR in 1 week.  Thanks,  Katrinka Blazing, MD Gastroenterology and Hepatology [facility]

## 2023-05-29 NOTE — Plan of Care (Signed)

## 2023-05-31 ENCOUNTER — Other Ambulatory Visit (INDEPENDENT_AMBULATORY_CARE_PROVIDER_SITE_OTHER): Payer: Self-pay

## 2023-05-31 ENCOUNTER — Telehealth: Payer: Self-pay

## 2023-05-31 ENCOUNTER — Telehealth: Payer: Self-pay | Admitting: Internal Medicine

## 2023-05-31 DIAGNOSIS — E876 Hypokalemia: Secondary | ICD-10-CM

## 2023-05-31 DIAGNOSIS — D509 Iron deficiency anemia, unspecified: Secondary | ICD-10-CM

## 2023-05-31 DIAGNOSIS — D539 Nutritional anemia, unspecified: Secondary | ICD-10-CM

## 2023-05-31 DIAGNOSIS — E871 Hypo-osmolality and hyponatremia: Secondary | ICD-10-CM

## 2023-05-31 DIAGNOSIS — K7200 Acute and subacute hepatic failure without coma: Secondary | ICD-10-CM

## 2023-05-31 NOTE — Telephone Encounter (Signed)
Patient left a message saying she missed a call from our office - re: blood work and further evaluation.  The phone number she left was   [phone] and I saw your note where you called but the voice mail was full.

## 2023-05-31 NOTE — Telephone Encounter (Signed)
Patient made aware to have labs drawn at [facility] on Friday 06/04/2023. She is also aware of the 06/17/2023 appointment with Brooke Bonito.

## 2023-05-31 NOTE — Telephone Encounter (Signed)
I have placed the lab orders for the patient to have drawn at [facility]. Tried calling patient, voice mail full.

## 2023-05-31 NOTE — Transitions of Care (Post Inpatient/ED Visit) (Signed)
   05/31/2023  Name: Pamela [name] MRN: [id number] DOB: 1981-09-02  Today's TOC FU Call Status: Today's TOC FU Call Status:: Successful TOC FU Call Competed TOC FU Call Complete Date: 05/31/23  Transition Care Management Follow-up Telephone Call Date of Discharge: 05/29/23 [facility] Type of Discharge: Inpatient Admission Primary Inpatient Discharge Diagnosis:: hepatic failure How have you been since you were released from the hospital?: Better Any questions or concerns?: No  Items Reviewed: Did you receive and understand the discharge instructions provided?: No Medications obtained,verified, and reconciled?: Yes (Medications Reviewed) Any new allergies since your discharge?: No Dietary orders reviewed?: Yes Do you have support at home?: Yes People in Home: parent(s)  Medications Reviewed Today: Medications Reviewed Today     Reviewed by Karena Addison, LPN (Licensed Practical Nurse) on 05/31/23 at 1143  Med List Status: <None>   Medication Order Taking? Sig Documenting Provider Last Dose Status Informant  folic acid (FOLVITE) 1 MG tablet [id number]  Take 1 tablet (1 mg total) by mouth daily. Vassie Loll, MD  Active   gabapentin (NEURONTIN) 100 MG capsule [id number] No Take 100 mg by mouth 3 (three) times daily as needed (pain). [provider] Past Week Active Self  Iron, Ferrous Sulfate, 325 (65 Fe) MG TABS [id number]  Take 325 mg by mouth daily. Vassie Loll, MD  Active   midodrine (PROAMATINE) 2.5 MG tablet [id number]  Take 1 tablet (2.5 mg total) by mouth 3 (three) times daily with meals. Vassie Loll, MD  Active   ondansetron (ZOFRAN-ODT) 8 MG disintegrating tablet [id number]  Take 1 tablet (8 mg total) by mouth every 8 (eight) hours as needed for nausea or vomiting. Vassie Loll, MD  Active   pantoprazole (PROTONIX) 40 MG tablet [id number]  Take 1 tablet (40 mg total) by mouth daily. Vassie Loll, MD  Active   SUBOXONE 8-2 MG FILM  [id number] No Take 1 Film by mouth daily. [provider] Past Week Active Self           Med Note Wynelle Link   Wed May 26, 2023  8:42 AM) Pt states she takes PRN            Home Care and Equipment/Supplies: Were Home Health Services Ordered?: NA Any new equipment or medical supplies ordered?: NA  Functional Questionnaire: Do you need assistance with bathing/showering or dressing?: No Do you need assistance with meal preparation?: No Do you need assistance with eating?: No Do you have difficulty maintaining continence: No Do you need assistance with getting out of bed/getting out of a chair/moving?: No Do you have difficulty managing or taking your medications?: No  Follow up appointments reviewed: PCP Follow-up appointment confirmed?: Yes Date of PCP follow-up appointment?: 06/18/23 (patient out of town until 06/12/23) Follow-up Provider: [facility] Follow-up appointment confirmed?: NA Do you need transportation to your follow-up appointment?: No Do you understand care options if your condition(s) worsen?: Yes-patient verbalized understanding    SIGNATURE Karena Addison, LPN [facility] Nurse Health Advisor Direct Dial [phone]

## 2023-05-31 NOTE — Telephone Encounter (Signed)
Thanks, Noted. I spoke with the patient thank you .

## 2023-06-05 LAB — COMPREHENSIVE METABOLIC PANEL
ALT: 33 IU/L — ABNORMAL HIGH (ref 0–32)
BUN/Creatinine Ratio: 15 (ref 9–23)
Calcium: 8.6 mg/dL — ABNORMAL LOW (ref 8.7–10.2)
Chloride: 95 mmol/L — ABNORMAL LOW (ref 96–106)
Creatinine, Ser: 0.4 mg/dL — ABNORMAL LOW (ref 0.57–1.00)
Globulin, Total: 3 g/dL (ref 1.5–4.5)

## 2023-06-05 LAB — CBC WITH DIFFERENTIAL/PLATELET

## 2023-06-07 ENCOUNTER — Telehealth: Payer: Self-pay

## 2023-06-07 ENCOUNTER — Other Ambulatory Visit (INDEPENDENT_AMBULATORY_CARE_PROVIDER_SITE_OTHER): Payer: Self-pay | Admitting: *Deleted

## 2023-06-07 ENCOUNTER — Encounter (INDEPENDENT_AMBULATORY_CARE_PROVIDER_SITE_OTHER): Payer: Self-pay | Admitting: *Deleted

## 2023-06-07 ENCOUNTER — Telehealth (INDEPENDENT_AMBULATORY_CARE_PROVIDER_SITE_OTHER): Payer: Self-pay | Admitting: *Deleted

## 2023-06-07 ENCOUNTER — Other Ambulatory Visit (INDEPENDENT_AMBULATORY_CARE_PROVIDER_SITE_OTHER): Payer: Self-pay | Admitting: Gastroenterology

## 2023-06-07 DIAGNOSIS — K7030 Alcoholic cirrhosis of liver without ascites: Secondary | ICD-10-CM

## 2023-06-07 LAB — CBC WITH DIFFERENTIAL/PLATELET
Basos: 1 %
Eos: 2 %
Hemoglobin: 9.8 g/dL — ABNORMAL LOW (ref 11.1–15.9)
Immature Grans (Abs): 0.4 10*3/uL — ABNORMAL HIGH (ref 0.0–0.1)
Immature Granulocytes: 2 %
Lymphocytes Absolute: 2.5 10*3/uL (ref 0.7–3.1)
MCHC: 37 g/dL — ABNORMAL HIGH (ref 31.5–35.7)
MCV: 104 fL — ABNORMAL HIGH (ref 79–97)
Monocytes Absolute: 1.4 10*3/uL — ABNORMAL HIGH (ref 0.1–0.9)
Neutrophils Absolute: 11.9 10*3/uL — ABNORMAL HIGH (ref 1.4–7.0)
Neutrophils: 72 %
Platelets: 161 10*3/uL (ref 150–450)
RBC: 2.56 x10E6/uL — CL (ref 3.77–5.28)
RDW: 14.9 % (ref 11.7–15.4)
WBC: 16.7 10*3/uL — ABNORMAL HIGH (ref 3.4–10.8)

## 2023-06-07 LAB — COMPREHENSIVE METABOLIC PANEL
AST: 106 IU/L — ABNORMAL HIGH (ref 0–40)
Albumin: 3.6 g/dL — ABNORMAL LOW (ref 3.9–4.9)
Alkaline Phosphatase: 152 IU/L — ABNORMAL HIGH (ref 44–121)
BUN: 6 mg/dL (ref 6–24)
Bilirubin Total: 8.9 mg/dL — ABNORMAL HIGH (ref 0.0–1.2)
CO2: 24 mmol/L (ref 20–29)
Glucose: 87 mg/dL (ref 70–99)
Potassium: 3.7 mmol/L (ref 3.5–5.2)
Sodium: 133 mmol/L — ABNORMAL LOW (ref 134–144)
Total Protein: 6.6 g/dL (ref 6.0–8.5)
eGFR: 127 mL/min/{1.73_m2} (ref >59)

## 2023-06-07 LAB — PROTIME-INR
INR: 1.5 — ABNORMAL HIGH (ref 0.9–1.2)
Prothrombin Time: 15.7 s — ABNORMAL HIGH (ref 9.1–12.0)

## 2023-06-07 MED ORDER — SPIRONOLACTONE 50 MG PO TABS
50.0000 mg | ORAL_TABLET | Freq: Every day | ORAL | 0 refills | Status: DC
Start: 2023-06-07 — End: 2023-06-17

## 2023-06-07 MED ORDER — FUROSEMIDE 20 MG PO TABS
20.0000 mg | ORAL_TABLET | Freq: Every day | ORAL | 0 refills | Status: DC
Start: 2023-06-07 — End: 2023-06-17

## 2023-06-07 NOTE — Telephone Encounter (Signed)
Pt is calling here regarding swelling in her feet, legs, and knees. This pt has not been seen down here by anyone. States she needs to know what to do. Please advise

## 2023-06-07 NOTE — Telephone Encounter (Signed)
Order for bmp put in for pt to do in one week due on July 8th. Pt is aware she needs lab work but I will call her Friday to remind her to go to lab on Monday.

## 2023-06-07 NOTE — Telephone Encounter (Signed)
Pt was in hospital for acute liver failure. Has appt with [facility] Amend on 7/11. Dr. Levon Hedger ordered some labs on her on 6/28. She states the day after she was discharged from hospital she started having swelling in her legs from her thighs down. States her knees look like grapfruits, hurts to walk, ankles are cracking, she is not having any swelling in abdomen and no sob. She is not currently taking any fluids pills but states she is taking potassium 25 daily that is not on med list.   [phone].

## 2023-06-07 NOTE — Telephone Encounter (Signed)
I called the patient to discuss her current symptoms.  However, she did not answer my call and her voicemail was full. Please try to reach her and tell her that I suspect her symptoms may be related to her cirrhosis causing lower extremity swelling.  I will send some water pills she needs to take and we will need to repeat a BMP in 1 week today before her follow-up appointment with [facility].  If her swelling were to worsen, she will need to see her PCP or go to the ER for further evaluation. 

## 2023-06-07 NOTE — Telephone Encounter (Signed)
Discussed with patient per Dr. Levon Hedger -  I called the patient to discuss her current symptoms.  However, she did not answer my call and her voicemail was full. Please try to reach her and tell her that I suspect her symptoms may be related to her cirrhosis causing lower extremity swelling.  I will send some water pills she needs to take and we will need to repeat a BMP in 1 week today before her follow-up appointment with [facility].  If her swelling were to worsen, she will need to see her PCP or go to the ER for further evaluation.   Patient verbalized understanding.

## 2023-06-07 NOTE — Telephone Encounter (Signed)
noted 

## 2023-06-07 NOTE — Telephone Encounter (Signed)
After calling to speak with the pt I was advised that she had already spoken to someone else X 2

## 2023-06-07 NOTE — Telephone Encounter (Signed)
I called the patient to discuss her current symptoms.  However, she did not answer my call and her voicemail was full. Please try to reach her and tell her that I suspect her symptoms may be related to her cirrhosis causing lower extremity swelling.  I will send some water pills she needs to take and we will need to repeat a BMP in 1 week today before her follow-up appointment with [facility].  If her swelling were to worsen, she will need to see her PCP or go to the ER for further evaluation.

## 2023-06-09 NOTE — Telephone Encounter (Signed)
Pt called and reminded to go to [facility] on Monday for labs. She states she will do.

## 2023-06-12 ENCOUNTER — Encounter (HOSPITAL_COMMUNITY): Payer: Self-pay | Admitting: *Deleted

## 2023-06-12 ENCOUNTER — Emergency Department (HOSPITAL_COMMUNITY)
Admission: EM | Admit: 2023-06-12 | Discharge: 2023-06-12 | Disposition: A | Discharge status: Home / Self Care | Payer: Medicaid Other | Attending: Emergency Medicine | Admitting: Emergency Medicine

## 2023-06-12 ENCOUNTER — Other Ambulatory Visit: Payer: Self-pay

## 2023-06-12 DIAGNOSIS — R600 Localized edema: Secondary | ICD-10-CM | POA: Insufficient documentation

## 2023-06-12 DIAGNOSIS — R000 Tachycardia, unspecified: Secondary | ICD-10-CM | POA: Insufficient documentation

## 2023-06-12 LAB — CBC WITH DIFFERENTIAL/PLATELET
Abs Immature Granulocytes: 0.1 10*3/uL — ABNORMAL HIGH (ref 0.00–0.07)
Basophils Absolute: 0.1 10*3/uL (ref 0.0–0.1)
Basophils Relative: 1 %
Eosinophils Absolute: 0.4 10*3/uL (ref 0.0–0.5)
Eosinophils Relative: 3 %
HCT: 33.2 % — ABNORMAL LOW (ref 36.0–46.0)
Hemoglobin: 11.3 g/dL — ABNORMAL LOW (ref 12.0–15.0)
Immature Granulocytes: 1 %
Lymphocytes Relative: 25 %
Lymphs Abs: 3 10*3/uL (ref 0.7–4.0)
MCH: 38.7 pg — ABNORMAL HIGH (ref 26.0–34.0)
MCHC: 34 g/dL (ref 30.0–36.0)
MCV: 113.7 fL — ABNORMAL HIGH (ref 80.0–100.0)
Monocytes Absolute: 0.8 10*3/uL (ref 0.1–1.0)
Monocytes Relative: 6 %
Neutro Abs: 7.6 10*3/uL (ref 1.7–7.7)
Neutrophils Relative %: 64 %
Platelets: 240 10*3/uL (ref 150–400)
RBC: 2.92 MIL/uL — ABNORMAL LOW (ref 3.87–5.11)
RDW: 16.3 % — ABNORMAL HIGH (ref 11.5–15.5)
WBC: 12 10*3/uL — ABNORMAL HIGH (ref 4.0–10.5)
nRBC: 0 % (ref 0.0–0.2)

## 2023-06-12 LAB — COMPREHENSIVE METABOLIC PANEL
ALT: 33 U/L (ref 0–44)
AST: 111 U/L — ABNORMAL HIGH (ref 15–41)
Albumin: 3.3 g/dL — ABNORMAL LOW (ref 3.5–5.0)
Alkaline Phosphatase: 123 U/L (ref 38–126)
Anion gap: 9 (ref 5–15)
BUN: <5 mg/dL — ABNORMAL LOW (ref 6–20)
CO2: 24 mmol/L (ref 22–32)
Calcium: 9 mg/dL (ref 8.9–10.3)
Chloride: 98 mmol/L (ref 98–111)
Creatinine, Ser: 0.43 mg/dL — ABNORMAL LOW (ref 0.44–1.00)
GFR, Estimated: >60 mL/min (ref >60)
Glucose, Bld: 109 mg/dL — ABNORMAL HIGH (ref 70–99)
Potassium: 3.8 mmol/L (ref 3.5–5.1)
Sodium: 131 mmol/L — ABNORMAL LOW (ref 135–145)
Total Bilirubin: 7.1 mg/dL — ABNORMAL HIGH (ref 0.3–1.2)
Total Protein: 8.1 g/dL (ref 6.5–8.1)

## 2023-06-12 LAB — CULTURE, BLOOD (ROUTINE X 2)

## 2023-06-12 LAB — PROTIME-INR
INR: 1.4 — ABNORMAL HIGH (ref 0.8–1.2)
Prothrombin Time: 17.6 seconds — ABNORMAL HIGH (ref 11.4–15.2)

## 2023-06-12 LAB — ETHANOL: Alcohol, Ethyl (B): <10 mg/dL (ref <10)

## 2023-06-12 LAB — LACTIC ACID, PLASMA: Lactic Acid, Venous: 1.7 mmol/L (ref 0.5–1.9)

## 2023-06-12 LAB — LIPASE, BLOOD: Lipase: 37 U/L (ref 11–51)

## 2023-06-12 MED ORDER — FUROSEMIDE 10 MG/ML IJ SOLN
40.0000 mg | Freq: Once | INTRAMUSCULAR | Status: AC
  Administered 2023-06-12: 40 mg via INTRAVENOUS
  Filled 2023-06-12: qty 4

## 2023-06-12 MED ORDER — CEPHALEXIN 500 MG PO CAPS: 500.0000 mg | ORAL_CAPSULE | Freq: Four times a day (QID) | ORAL | 0 refills | Status: DC

## 2023-06-12 MED ORDER — FUROSEMIDE 40 MG PO TABS: 40.0000 mg | ORAL_TABLET | Freq: Two times a day (BID) | ORAL | 0 refills | Status: DC

## 2023-06-12 NOTE — ED Triage Notes (Signed)
Pt with bilateral leg swelling. Redness for past 4-5 days.  Pt still jaundice.

## 2023-06-12 NOTE — ED Provider Notes (Signed)
[place] EMERGENCY DEPARTMENT AT [facility] Provider Note   CSN: [id number] Arrival date & time: 06/12/23  1520     History  Chief Complaint  Patient presents with   Leg Swelling    Pamela [name] is a 42 y.o. female.  HPI   This patient is a 42 year old female who has a history of liver cirrhosis and a prior history of alcoholic liver disease who had been admitted to the hospital within the last month because of acute on chronic liver failure.  She states that when she was discharged from the hospital she had been feeling much better and within 48 hours of going home she started to notice swelling in her legs.  She thought that the appropriate remedy would be to start drinking more water and has thus been drinking between 10 and 12 bottles of water per day while taking the medications at the same time.  She had been prescribed furosemide by Dr. Levon Hedger, she had been prescribed spironolactone as well and in addition to that has been placed on midodrine because of some transient hypotension that she was having in the hospital.  Additionally the patient takes Suboxone and gabapentin.  She reports that over the last couple of weeks the swelling has become quite severe and is now starting to spread up her legs onto her thighs.  It is becoming somewhat painful and she has noticed that over the last couple of days she has had a bright red color of the legs from the knee through the feet bilaterally.  She has no fevers or chills, she is not nauseated vomiting or having diarrhea.  She has stated that her friends are telling her that she looks more jaundiced than she did in the past  Home Medications Prior to Admission medications   Medication Sig Start Date End Date Taking? Authorizing Provider  cephALEXin (KEFLEX) 500 MG capsule Take 1 capsule (500 mg total) by mouth 4 (four) times daily. 06/12/23  Yes Eber Hong, MD  furosemide (LASIX) 40 MG tablet Take 1 tablet (40 mg total) by  mouth 2 (two) times daily for 5 days. 06/12/23 06/17/23 Yes Eber Hong, MD  folic acid (FOLVITE) 1 MG tablet Take 1 tablet (1 mg total) by mouth daily. 05/30/23   Vassie Loll, MD  furosemide (LASIX) 20 MG tablet Take 1 tablet (20 mg total) by mouth daily. 06/07/23   Dolores Frame, MD  gabapentin (NEURONTIN) 100 MG capsule Take 100 mg by mouth 3 (three) times daily as needed (pain).    [provider]  Iron, Ferrous Sulfate, 325 (65 Fe) MG TABS Take 325 mg by mouth daily. 05/29/23   Vassie Loll, MD  midodrine (PROAMATINE) 2.5 MG tablet Take 1 tablet (2.5 mg total) by mouth 3 (three) times daily with meals. 05/29/23   Vassie Loll, MD  ondansetron (ZOFRAN-ODT) 8 MG disintegrating tablet Take 1 tablet (8 mg total) by mouth every 8 (eight) hours as needed for nausea or vomiting. 05/29/23   Vassie Loll, MD  pantoprazole (PROTONIX) 40 MG tablet Take 1 tablet (40 mg total) by mouth daily. 05/29/23 05/28/24  Vassie Loll, MD  PRESCRIPTION MEDICATION Potassium 25 one daily    [provider]  spironolactone (ALDACTONE) 50 MG tablet Take 1 tablet (50 mg total) by mouth daily. 06/07/23   Dolores Frame, MD  SUBOXONE 8-2 MG FILM Take 1 Film by mouth daily. 04/17/23   [provider]      Allergies  Zomig [zolmitriptan]    Review of Systems   Review of Systems  All other systems reviewed and are negative.   Physical Exam Updated Vital Signs BP 112/67 (BP Location: Right Arm)   Pulse 92   Temp 99.3 F (37.4 C) (Oral)   Resp 16   Ht 1.626 m (5\' 4" )   Wt 65.3 kg   LMP 02/13/2021 (Approximate) Comment: LMP WAS IN MARCH  SpO2 98%   BMI 24.72 kg/m  Physical Exam Vitals and nursing note reviewed.  Constitutional:      General: She is not in acute distress.    Appearance: She is well-developed.  HENT:     Head: Normocephalic and atraumatic.     Nose: No congestion or rhinorrhea.     Mouth/Throat:     Pharynx: No oropharyngeal exudate.   Eyes:     General: Scleral icterus present.        Right eye: No discharge.        Left eye: No discharge.     Pupils: Pupils are equal, round, and reactive to light.  Neck:     Thyroid: No thyromegaly.     Vascular: No JVD.  Cardiovascular:     Rate and Rhythm: Regular rhythm. Tachycardia present.     Heart sounds: Normal heart sounds. No murmur heard.    No friction rub. No gallop.  Pulmonary:     Effort: Pulmonary effort is normal. No respiratory distress.     Breath sounds: Normal breath sounds. No wheezing or rales.  Abdominal:     General: Bowel sounds are normal. There is no distension.     Palpations: Abdomen is soft. There is no mass.     Tenderness: There is no abdominal tenderness.  Musculoskeletal:        General: No tenderness. Normal range of motion.     Cervical back: Normal range of motion and neck supple.     Right lower leg: Edema present.     Left lower leg: Edema present.     Comments: ` The patient has difficulty with her gait secondary to severe edema of her bilateral lower extremities.  She has 3+ edema all the way up to her thighs.  There is no edema on her abdominal wall  Lymphadenopathy:     Cervical: No cervical adenopathy (.milm).  Skin:    General: Skin is warm and dry.     Findings: Erythema present. No rash.     Comments: There is redness to the bilateral lower extremities from the knees through the feet, circumferential, it does not involve the toes nor the plantar aspect of the foot, there is no breaks in the skin, there is no induration, there is just diffuse edema  Neurological:     Mental Status: She is alert.     Coordination: Coordination normal.  Psychiatric:        Behavior: Behavior normal.     ED Results / Procedures / Treatments   Labs (all labs ordered are listed, but only abnormal results are displayed) Labs Reviewed  CBC WITH DIFFERENTIAL/PLATELET - Abnormal; Notable for the following components:      Result Value   WBC 12.0  (*)    RBC 2.92 (*)    Hemoglobin 11.3 (*)    HCT 33.2 (*)    MCV 113.7 (*)    MCH 38.7 (*)    RDW 16.3 (*)    Abs Immature Granulocytes 0.10 (*)    All other components  within normal limits  COMPREHENSIVE METABOLIC PANEL - Abnormal; Notable for the following components:   Sodium 131 (*)    Glucose, Bld 109 (*)    BUN <5 (*)    Creatinine, Ser 0.43 (*)    Albumin 3.3 (*)    AST 111 (*)    Total Bilirubin 7.1 (*)    All other components within normal limits  PROTIME-INR - Abnormal; Notable for the following components:   Prothrombin Time 17.6 (*)    INR 1.4 (*)    All other components within normal limits  CULTURE, BLOOD (ROUTINE X 2)  CULTURE, BLOOD (ROUTINE X 2)  LIPASE, BLOOD  ETHANOL  LACTIC ACID, PLASMA  LACTIC ACID, PLASMA    EKG None  Radiology No results found.  Procedures Procedures    Medications Ordered in ED Medications  furosemide (LASIX) injection 40 mg (40 mg Intravenous Given 06/12/23 1838)    ED Course/ Medical Decision Making/ A&P                             Medical Decision Making Amount and/or Complexity of Data Reviewed Labs: ordered.  Risk Prescription drug management.    This patient presents to the ED for concern of progressive edema and redness of the legs, this involves an extensive number of treatment options, and is a complaint that carries with it a high risk of complications and morbidity.  The differential diagnosis includes persistent liver failure, increasing fluid intake, inadequate diuresis, worsening liver failure, unlikely to be congestive heart failure   Co morbidities that complicate the patient evaluation  Known alcoholic and acute liver injury   Additional history obtained:  Additional history obtained from medical record External records from outside source obtained and reviewed including prior admission to the hospital with discharge summaries as well as consultation notes. The last comprehensive metabolic  panel showed normal renal function with a GFR of 127 and a creatinine of 0.4, sodium potassium and chloride were essentially normal, AST was 106 ALT of 33 and an alk phos of 152 and a bilirubin of 8.9.  When that had been drawn on the 18th closer to her time of admission her bilirubin was 16.   Lab Tests:  I Ordered, and personally interpreted labs.  The pertinent results include: CBC with mild leukocytosis of 12,000 though this is significantly better than what it was during her admission.  Her bilirubin is at 7, LFTs are unremarkable, renal function and electrolytes are reassuring except for mild hyponatremia   Cardiac Monitoring: / EKG:  The patient was maintained on a cardiac monitor.  I personally viewed and interpreted the cardiac monitored which showed an underlying rhythm of: Borderline sinus tachycardia   Consultations Obtained:  I requested consultation with the Dr. Jena Gauss with gastroenterology,  and discussed lab and imaging findings as well as pertinent plan - they recommend: Restricting to 2000 cc of water per day as well as 2 g of sodium, increasing Lasix to 40 mg twice daily for the next 5 days and a metabolic panel repeated in the middle of the week.  Follow-up with local GI, no need for admission to hospital given the patient's good appearance and lab work   Problem List / ED Course / Critical interventions / Medication management  Patient given IV Lasix, a formal weight was placed on the chart, the patient will receive a course of cephalexin and close follow-up, GI recommendations will be implemented I  ordered medication including furosemide for diuresis Reevaluation of the patient after these medicines showed that the patient improved I have reviewed the patients home medicines and have made adjustments as needed   Social Determinants of Health:  Chronic liver failure, prior alcohol abuse   Test / Admission - Considered:  Considered admission but after discussion  with GI they feel the patient can be managed as an outpatient and I totally agree with this that she is very well-appearing and can be diuresed.  She does not have a fever or hypotension and I suspect that the swelling in her legs is what is caused the discoloration and not from infection itself.  She has no breaks in the skin, no fevers, no nausea or vomiting, she is not feeling weak and she is not hypotensive.         Final Clinical Impression(s) / ED Diagnoses Final diagnoses:  Peripheral edema    Rx / DC Orders ED Discharge Orders          Ordered    furosemide (LASIX) 40 MG tablet  2 times daily        06/12/23 1925    cephALEXin (KEFLEX) 500 MG capsule  4 times daily        06/12/23 1925              Eber Hong, MD 06/12/23 [phone]

## 2023-06-12 NOTE — Discharge Instructions (Addendum)
I would like for you to follow-up with your gastroenterologist within the next 5 days for recheck of your metabolic panel.  You are to take Lasix 40 mg twice a day, restrict your water to approximately 6 bottles of water per day or less, I would also like you to keep your sodium intake under 2 g/day.  Return to the ER for severe worsening swelling shortness of breath or any worsening symptoms.  If you cannot follow-up with your GI doctor this week then please come back to the ER on Wednesday or Thursday for a recheck and to have your blood work done to make sure that your electrolytes and knee function are okay  After the 5 days go back to your normal dose of furosemide or Lasix.  You will also have some benefit by using compression stockings on your legs to help the swelling go down

## 2023-06-13 LAB — CULTURE, BLOOD (ROUTINE X 2)

## 2023-06-14 LAB — CULTURE, BLOOD (ROUTINE X 2): Culture: NO GROWTH

## 2023-06-15 LAB — BASIC METABOLIC PANEL
BUN/Creatinine Ratio: 17 (ref 9–23)
BUN: 7 mg/dL (ref 6–24)
CO2: 25 mmol/L (ref 20–29)
Calcium: 9.3 mg/dL (ref 8.7–10.2)
Chloride: 93 mmol/L — ABNORMAL LOW (ref 96–106)
Creatinine, Ser: 0.42 mg/dL — ABNORMAL LOW (ref 0.57–1.00)
Glucose: 99 mg/dL (ref 70–99)
Potassium: 3.6 mmol/L (ref 3.5–5.2)
Sodium: 135 mmol/L (ref 134–144)
eGFR: 125 mL/min/{1.73_m2} (ref >59)

## 2023-06-15 LAB — CULTURE, BLOOD (ROUTINE X 2)

## 2023-06-16 LAB — CULTURE, BLOOD (ROUTINE X 2)

## 2023-06-16 NOTE — Progress Notes (Signed)
GI Office Note    Referring Provider: Babs Sciara, MD Primary Care Physician:  Babs Sciara, MD  Primary Gastroenterologist: Hennie Duos. Marletta Lor, DO  Chief Complaint   Chief Complaint  Patient presents with   Hospitalization Follow-up    Having issues with swelling, needing refills on fluid pills.    History of Present Illness   Pamela [name] is a 42 y.o. female presenting today for hospital follow-up of elevated LFTs, alcoholic hepatitis, cirrhosis, and anemia.  Seen during hospitalization in June 6/18-6/22.  She presented to the hospital with abdominal pain, nausea, vomiting, and jaundice.  Also with abdominal swelling.  Her presentation was acute alcoholic hepatitis in the setting of anasarca. Labs were checked regularly and she was started on prednisolone for alcoholic hepatitis.  PPI daily.  Recommended complete alcohol cessation.  Start iron therapy.  Plan for outpatient EGD and colonoscopy.  Lille score 0.627 therefore she was advised to be discharged without Prednisolone.  She is advised to have CBC, CMP, INR in 1 week and close follow-up in 1-2 weeks.  She was also advised that if sodium improves could consider low-dose furosemide and spironolactone.  Advised to consider lactulose if she presents any recurrent forgetfulness or changes in mental status.  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   CT liver/abdomen 05/26/23 IMPRESSION: 1. Stable appearing cirrhotic changes involving the liver with portal venous hypertension, HSM, extensive portal venous collaterals and splenorenal shunt. 2. No arterial phase enhancing lesions to suggest dysplastic nodules or HCC. 3. Small volume abdominal and upper pelvic ascites. 4. Colonic wall thickening and submucosal edema could suggest colitis or may be related to low albumin and mild ascites. Recommend correlation with any colonic symptoms such as diarrhea  Minimal ascites on  [place] 05/28/23  ED visit 06/12/23 for edema. Lasix was increased to BID for 5 days and advised recheck of electrolytes and follow with fluid restriction. Labs 06/12/23: WBC 12, hemoglobin 11.3, platelets 240, sodium 131, creatinine 0.43 33, alk phos 123, T. bili 7.1, lipase 37, ethanol <10.  Blood cultures negative.  INR 1.4   Today: Cirrhosis history Hematemesis/coffee ground emesis: No vomiting.  History of variceal bleeding: none Abdominal pain: none Abdominal distention/worsening ascites: improving, having skin sagging.  Peripheral edema:  Fever/chills: none Episodes of confusion/disorientation: none Number of daily bowel movements: 1-2 per day Taking diuretics?: yes. Lasix BID for short term.  Date of last EGD: never Prior history of banding?: n/a Prior episodes of SBP: none Last time liver imaging was performed: 05/26/23  MELD 3.0: 20 at 06/14/2023 11:48 AM MELD-Na: 19 at 06/14/2023 11:48 AM Calculated from: Serum Creatinine: 0.42 mg/dL (Using min of 1 mg/dL) at 12/12/1094 04:54 AM Serum Sodium: 135 mmol/L at 06/14/2023 11:48 AM Total Bilirubin: 7.1 mg/dL at 0/08/8118  1:47 PM Serum Albumin: 3.3 g/dL at 07/08/9561  1:30 PM INR(ratio): 1.4 at 06/12/2023  4:07 PM Age at listing (hypothetical): 41 years Sex: Female at 06/14/2023 11:48 AM  Manager of a convenient store now. Was trying to exercise and elevate some. At ne point she could wear compression socks but she went on vacation on 6/3 and all her family told her to keep her feet up for 4 hours and sometimes longer but she felt like that made it worse but she would have significant pain with blood rush. Is sitting as much as possible at work but also getting up when she needs to.   She states it  used to look like a severe sunburn. Has 2 fluid pills left and she is taking an antibiotic 4 times a day.   The last 2 days she has added back the spironolactone. No chest pain or shortness of breath.   She has been drinking 11-13 bottles of water  previously - has backed off some since then.   Had gallbladder removed about 1-2 years ago. After that she was not able to eat. Had diarrhea and nausea prior to ED.  Constantly hungry. Has been taking her iron.    Current Outpatient Medications  Medication Sig Dispense Refill   cephALEXin (KEFLEX) 500 MG capsule Take 1 capsule (500 mg total) by mouth 4 (four) times daily. 40 capsule 0   folic acid (FOLVITE) 1 MG tablet Take 1 tablet (1 mg total) by mouth daily. 30 tablet 1   furosemide (LASIX) 40 MG tablet Take 1 tablet (40 mg total) by mouth 2 (two) times daily for 5 days. 10 tablet 0   Iron, Ferrous Sulfate, 325 (65 Fe) MG TABS Take 325 mg by mouth daily. 30 tablet 1   midodrine (PROAMATINE) 2.5 MG tablet Take 1 tablet (2.5 mg total) by mouth 3 (three) times daily with meals. 90 tablet 1   ondansetron (ZOFRAN-ODT) 8 MG disintegrating tablet Take 1 tablet (8 mg total) by mouth every 8 (eight) hours as needed for nausea or vomiting. 20 tablet 0   pantoprazole (PROTONIX) 40 MG tablet Take 1 tablet (40 mg total) by mouth daily. 30 tablet 1   POTASSIUM GLUCONATE PO Take 90 mg by mouth daily.     SUBOXONE 8-2 MG FILM Take 1 Film by mouth daily.     No current facility-administered medications for this visit.    Past Medical History:  Diagnosis Date   Abnormal vaginal Pap smear    ASCUS of cervix with negative high risk HPV 09/01/2021   Repeat papin 3 years, per [facility], 5 year risk for CIN 3+ is 0.40%   Depression    Elevated liver enzymes 08/01/2021   Stop alcohol, recheck at appt, get [place] 9/1   Migraines    Neuropathy    Pregnant 06/19/2015   Sludge in gallbladder 09/01/2021   Refer to Dr Lovell Sheehan   Vaginal Pap smear, abnormal     Past Surgical History:  Procedure Laterality Date   BACK SURGERY     CESAREAN SECTION  x 2   CESAREAN SECTION WITH BILATERAL TUBAL LIGATION Bilateral 02/11/2016   Procedure: REPEAT CESAREAN SECTION WITH BILATERAL TUBAL LIGATION;  Surgeon: Tilda Burrow,  MD;  Location: [facility];  Service: Obstetrics;  Laterality: Bilateral;   CHOLECYSTECTOMY N/A 10/22/2021   Procedure: LAPAROSCOPIC CHOLECYSTECTOMY;  Surgeon: Franky Macho, MD;  Location: [facility];  Service: General;  Laterality: N/A;   TONSILLECTOMY AND ADENOIDECTOMY      Family History  Problem Relation Age of Onset   Dementia Paternal Grandfather    Dementia Paternal Grandmother     Allergies as of 06/17/2023 - Review Complete 06/17/2023  Allergen Reaction Noted   Zomig [zolmitriptan] Shortness Of Breath and Swelling 07/30/2011    Social History   Socioeconomic History   Marital status: Divorced    Spouse name: Not on file   Number of children: Not on file   Years of education: 2 yr coll.   Highest education level: Not on file  Occupational History   Occupation: event planner  Tobacco Use   Smoking status: Every Day    Current packs/day: 1.00  Average packs/day: 1 pack/day for 19.0 years (19.0 ttl pk-yrs)    Types: Cigarettes    Start date: 08/02/2007   Smokeless tobacco: Never   Tobacco comments:    smokes 6 cig per day  Vaping Use   Vaping status: Never Used  Substance and Sexual Activity   Alcohol use: Yes    Comment: occ   Drug use: No    Types: Marijuana   Sexual activity: Not Currently    Birth control/protection: Surgical    Comment: tubal  Other Topics Concern   Not on file  Social History Narrative   Not on file   Social Determinants of Health   Financial Resource Strain: High Risk (08/20/2021)   Overall Financial Resource Strain (CARDIA)    Difficulty of Paying Living Expenses: Very hard  Food Insecurity: No Food Insecurity (05/25/2023)   Hunger Vital Sign    Worried About Running Out of Food in the Last Year: Never true    Ran Out of Food in the Last Year: Never true  Transportation Needs: No Transportation Needs (05/25/2023)   PRAPARE - Administrator, Civil Service (Medical): No    Lack of Transportation (Non-Medical): No  Physical  Activity: Inactive (08/20/2021)   Exercise Vital Sign    Days of Exercise per Week: 0 days    Minutes of Exercise per Session: 0 min  Stress: Stress Concern Present (08/20/2021)   [employer] of Occupational Health - Occupational Stress Questionnaire    Feeling of Stress : Very much  Social Connections: Socially Isolated (08/20/2021)   Social Connection and Isolation Panel [NHANES]    Frequency of Communication with Friends and Family: Three times a week    Frequency of Social Gatherings with Friends and Family: Never    Attends Religious Services: Never    Database administrator or Organizations: No    Attends Banker Meetings: Never    Marital Status: Divorced  Catering manager Violence: Not At Risk (05/25/2023)   Humiliation, Afraid, Rape, and Kick questionnaire    Fear of Current or Ex-Partner: No    Emotionally Abused: No    Physically Abused: No    Sexually Abused: No     Review of Systems   Gen: Denies any fever, chills, fatigue, weight loss, lack of appetite.  CV: Denies chest pain, heart palpitations, peripheral edema, syncope.  Resp: Denies shortness of breath at rest or with exertion. Denies wheezing or cough.  GI: see HPI GU : Denies urinary burning, urinary frequency, urinary hesitancy MS: Denies joint pain, muscle weakness, cramps, or limitation of movement.  Derm: Denies rash, itching, dry skin Psych: Denies depression, anxiety, memory loss, and confusion Heme: Denies bruising, bleeding, and enlarged lymph nodes.   Physical Exam   BP 105/64 (BP Location: Right Arm, Patient Position: Sitting, Cuff Size: Normal)   Pulse (!) 106   Temp 98.3 F (36.8 C) (Oral)   Ht 5\' 4"  (1.626 m)   Wt 130 lb (59 kg)   LMP 02/13/2021 (Approximate) Comment: LMP WAS IN MARCH  SpO2 97%   BMI 22.31 kg/m   General:   Alert and oriented. Pleasant and cooperative. Well-nourished and well-developed.  Head:  Normocephalic and atraumatic. Eyes:  Scleral icterus,  conjunctiva pink.  Ears:  Normal auditory acuity. Mouth:  No deformity or lesions, oral mucosa pink.  Lungs:  Clear to auscultation bilaterally. No wheezes, rales, or rhonchi. No distress.  Heart:  S1, S2 present without murmurs appreciated.  Abdomen:  +  BS, soft, non-tender and non-distended. No HSM noted. No guarding or rebound. No masses appreciated. Rectal:  Deferred  Msk:  Symmetrical without gross deformities. Normal posture. Extremities:  erythema to BLE with +2/+3 edema Neurologic:  Alert and  oriented x4;  grossly normal neurologically. Skin:  healing blisters/scabs to bilateral feet from pitting edema.  Psych:  Alert and cooperative. Normal mood and affect.   Assessment   Pamela [name] is a 42 y.o. female with a history of prior EtOH abuse with new cirrhosis diagnosis and depression presenting today for ED follow-up and to establish outpatient cirrhosis care.  Cirrhosis: Likely related to prior alcohol abuse however she has had previous evidence of hepatic steatosis therefore unable to rule out MASH/MASLD.  Linward Head[place] which she reported was only moderate use 1 drink nightly to help her sleep. No alcohol use since prior to hospitalization. Workup inpatient with negative viral hepatitis panel negative HIV.  Recent CT scan with cirrhotic liver with portal hypertension, hepatosplenomegaly, extensive portal venous collaterals and splenorenal shunt, small volume ascites.  Prior CT on 6/6 that outside hospital with concern for upper abdominal varices.  Paracentesis not performed while inpatient.  MELD as high as 32 likely driven by high bilirubin at 16.2.  She is in need of further workup for her cirrhosis including autoimmune serologies, ceruloplasmin, and AFP. In need of EGD for evaluation of anemia as well as to screen for esophageal varices. Will also recheck MELD labs to ensure improvement. Given ongoing peripheral edema and concern for cellulitis she will continue antibiotics and advised to  resume spironolactone and continue Lasix daily. (Recently took lasix 40 BID for her increased swelling).  Discussed regular cirrhosis care, fluid restriction, and diet recommendations.   Alcoholic hepatitis: Significantly elevated liver enzymes including T. bili up to 16.2 on admission.  MELD was 32 and DF 66.  She was started on prednisolone however 4 day  Lille score did not support continuing steroids therefore it was discontinued.  Per review of her labs her bilirubin continues to decline and her LFTs continue to improve. No recent alcohol use.   Anemia: Admission Hgb 11.4. Iron studies indicated low iron saturation.  Hemoglobin down trended while inpatient but likely somewhat hemodilutional.  GI reports intermittent BRBPR darker stools in the setting of iron therapy but denied any other signs of bleeding.  Never had EGD or colonoscopy.  Discussed the need for EGD and colonoscopy for further evaluation and she has agreed.   PLAN   CBC, CMP, INR, ANA, AMA, ASMA, ceruloplasmin, ALK1, A1A, IgG, IgA, AFP 2L fluid restriction 2g sodium restriction Alcohol cessation Lasix 40mg  daily. Refilled Restart spironolactone 50 mg daily.  Continue midodrine and iron daily RUQ [place] in December. MELD labs in 6 months. Proceed with upper endoscopy with propofol by Dr. Marletta Lor in near future: the risks, benefits, and alternatives have been discussed with the patient in detail. The patient states understanding and desires to proceed. ASA 3 UPT Hold iron 7 days Follow up 6-8 weeks.   Brooke Bonito, MSN, FNP-BC, AGACNP-BC [facility]

## 2023-06-17 ENCOUNTER — Ambulatory Visit: Payer: Medicaid Other | Admitting: Gastroenterology

## 2023-06-17 ENCOUNTER — Encounter: Payer: Self-pay | Admitting: Gastroenterology

## 2023-06-17 VITALS — BP 105/64 | HR 106 | Temp 98.3°F | Ht 64.0 in | Wt 130.0 lb

## 2023-06-17 DIAGNOSIS — K7030 Alcoholic cirrhosis of liver without ascites: Secondary | ICD-10-CM

## 2023-06-17 DIAGNOSIS — E871 Hypo-osmolality and hyponatremia: Secondary | ICD-10-CM | POA: Diagnosis not present

## 2023-06-17 DIAGNOSIS — K7011 Alcoholic hepatitis with ascites: Secondary | ICD-10-CM

## 2023-06-17 DIAGNOSIS — D509 Iron deficiency anemia, unspecified: Secondary | ICD-10-CM

## 2023-06-17 LAB — CULTURE, BLOOD (ROUTINE X 2): Culture: NO GROWTH

## 2023-06-17 MED ORDER — FUROSEMIDE 40 MG PO TABS: 40.0000 mg | ORAL_TABLET | Freq: Two times a day (BID) | ORAL | 2 refills | Status: DC

## 2023-06-17 NOTE — Patient Instructions (Addendum)
Limit your liquid intake to 2L of fluid daily for the next couple weeks until swelling mostly resolves.   Please have blood work completed at [facility] (Plan to go Monday or Tuesday next week 7/15 or 7/16).  We will call you with results once they have been received. Please allow 3-5 business days for review. 2 locations for [facility] in [place]:              1. [address], [place]              2. [address] [place]   For analogy to take Lasix 40 mg once daily and spironolactone 50 mg daily.  If you show any signs of increased swelling please let me know immediately.  After I receive your blood work this will help me determine how we need to titrate your diuretics.  Continue to take your midodrine as well as your iron.  Cirrhosis Lifestyle Recommendations:  High-protein diet from a primarily plant-based diet. Avoid red meat.  No raw or undercooked meat, seafood, or shellfish. Low-fat/cholesterol/carbohydrate diet. Limit sodium to no more than 2000 mg/day including everything that you eat and drink. Recommend at least 30 minutes of aerobic and resistance exercise 3 days/week. Limit Tylenol to 2000 mg daily.  Complete alcohol cessation  I will see you in 6 as 8 weeks for follow-up.  It was a pleasure to see you today. I want to create trusting relationships with patients. If you receive a survey regarding your visit,  I greatly appreciate you taking time to fill this out on paper or through your MyChart. I value your feedback.  Brooke Bonito, MSN, FNP-BC, AGACNP-BC [facility]

## 2023-06-18 ENCOUNTER — Ambulatory Visit: Payer: Medicaid Other | Admitting: Family Medicine

## 2023-06-18 VITALS — BP 100/63 | HR 90 | Wt 128.2 lb

## 2023-06-18 DIAGNOSIS — R188 Other ascites: Secondary | ICD-10-CM | POA: Diagnosis not present

## 2023-06-18 DIAGNOSIS — K7460 Unspecified cirrhosis of liver: Secondary | ICD-10-CM

## 2023-06-18 DIAGNOSIS — R600 Localized edema: Secondary | ICD-10-CM

## 2023-06-18 NOTE — Progress Notes (Signed)
   Subjective:    Patient ID: Pamela [name], female    DOB: [birth year], 42 y.o.   MRN: [id number]  HPI Patient arrives today hospital follow up. Patient states she has concerns for Dr Lorin Picket.   Patient recently in the hospital for elevated bilirubin Cirrhosis Liver failure She relates alcohol use after the death of her daughter but states not heavy alcohol use since then. Her bilirubin curiously was normal last fall but then went away just recently when she went in the hospital She states last occasional alcohol drink was back in May She relates a lot of fatigue tiredness denies high fever chills sweats nausea vomiting   Review of Systems     Objective:   Physical Exam  General-in no acute distress Eyes-no discharge Lungs-respiratory rate normal, CTA CV-no murmurs,RRR Extremities skin warm dry moderate lower leg edema Neuro grossly normal Behavior normal, alert Abdomen is soft slight protrusion extremities significant edema in the lower legs Patient was seen since the hospital by gastroenterology     Assessment & Plan:  2. Pedal edema She is on a regimen diuretics per GI and continue this follow through with labs We reviewed over hospital records reviewed over her labs Had good 30-minute discussion with her regarding cirrhosis warning signs to watch for and importance of following up with gastroenterology  She will follow-up with [place] in 3 to 4 months.

## 2023-06-22 LAB — COMPREHENSIVE METABOLIC PANEL
ALT: 21 IU/L (ref 0–32)
AST: 58 IU/L — ABNORMAL HIGH (ref 0–40)
Albumin: 3.7 g/dL — ABNORMAL LOW (ref 3.9–4.9)
Alkaline Phosphatase: 126 IU/L — ABNORMAL HIGH (ref 44–121)
BUN/Creatinine Ratio: 15 (ref 9–23)
BUN: 7 mg/dL (ref 6–24)
Bilirubin Total: 3.9 mg/dL — ABNORMAL HIGH (ref 0.0–1.2)
CO2: 23 mmol/L (ref 20–29)
Calcium: 9.7 mg/dL (ref 8.7–10.2)
Chloride: 96 mmol/L (ref 96–106)
Creatinine, Ser: 0.46 mg/dL — ABNORMAL LOW (ref 0.57–1.00)
Globulin, Total: 3.8 g/dL (ref 1.5–4.5)
Glucose: 138 mg/dL — ABNORMAL HIGH (ref 70–99)
Potassium: 4.2 mmol/L (ref 3.5–5.2)
Sodium: 135 mmol/L (ref 134–144)
Total Protein: 7.5 g/dL (ref 6.0–8.5)
eGFR: 122 mL/min/{1.73_m2} (ref >59)

## 2023-06-22 LAB — CBC
Hematocrit: 32.1 % — ABNORMAL LOW (ref 34.0–46.6)
Hemoglobin: 11.5 g/dL (ref 11.1–15.9)
MCH: 38 pg — ABNORMAL HIGH (ref 26.6–33.0)
MCHC: 35.8 g/dL — ABNORMAL HIGH (ref 31.5–35.7)
MCV: 106 fL — ABNORMAL HIGH (ref 79–97)
Platelets: 251 10*3/uL (ref 150–450)
RBC: 3.03 x10E6/uL — ABNORMAL LOW (ref 3.77–5.28)
RDW: 12.7 % (ref 11.7–15.4)
WBC: 9.2 10*3/uL (ref 3.4–10.8)

## 2023-06-22 LAB — ALPHA-1-ANTITRYPSIN: A-1 Antitrypsin: 220 mg/dL — ABNORMAL HIGH (ref 101–187)

## 2023-06-22 LAB — IGG: IgG (Immunoglobin G), Serum: 2183 mg/dL — ABNORMAL HIGH (ref 586–1602)

## 2023-06-22 LAB — MITOCHONDRIAL ANTIBODIES: Mitochondrial Ab: <20 Units (ref 0.0–20.0)

## 2023-06-22 LAB — ANTI-MICROSOMAL ANTIBODY LIVER / KIDNEY: LKM1 Ab: 1.8 Units (ref 0.0–20.0)

## 2023-06-22 LAB — PROTIME-INR
INR: 1.3 — ABNORMAL HIGH (ref 0.9–1.2)
Prothrombin Time: 14.1 s — ABNORMAL HIGH (ref 9.1–12.0)

## 2023-06-22 LAB — AFP TUMOR MARKER: AFP, Serum, Tumor Marker: 5.5 ng/mL (ref 0.0–6.4)

## 2023-06-22 LAB — CERULOPLASMIN: Ceruloplasmin: 22.1 mg/dL (ref 19.0–39.0)

## 2023-06-22 LAB — ANA W/REFLEX IF POSITIVE: Anti Nuclear Antibody (ANA): NEGATIVE

## 2023-06-22 LAB — ANTI-SMOOTH MUSCLE ANTIBODY, IGG: Smooth Muscle Ab: 12 Units (ref 0–19)

## 2023-06-22 LAB — IGA: IgA/Immunoglobulin A, Serum: 572 mg/dL — ABNORMAL HIGH (ref 87–352)

## 2023-06-28 ENCOUNTER — Encounter: Payer: Self-pay | Admitting: *Deleted

## 2023-06-28 ENCOUNTER — Other Ambulatory Visit: Payer: Self-pay | Admitting: *Deleted

## 2023-06-28 MED ORDER — PEG 3350-KCL-NA BICARB-NACL 420 G PO SOLR: 4000.0000 mL | Freq: Once | ORAL | 0 refills | Status: AC

## 2023-07-14 NOTE — Patient Instructions (Signed)
Pamela [name]  07/14/2023     @PREFPERIOPPHARMACY @   Your procedure is scheduled on  07/19/2023.   Report to [facility] at  0815  A.M.   Call this number if you have problems the morning of surgery:  [phone]  If you experience any cold or flu symptoms such as cough, fever, chills, shortness of breath, etc. between now and your scheduled surgery, please notify us at the above number.   Remember:  Follow the diet and prep instructions given to you by the office.     Take these medicines the morning of surgery with A SIP OF WATER                            zofran (if needed), pantoprazole.     Do not wear jewelry, make-up or nail polish, including gel polish,  artificial nails, or any other type of covering on natural nails (fingers and  toes).  Do not wear lotions, powders, or perfumes, or deodorant.  Do not shave 48 hours prior to surgery.  Men may shave face and neck.  Do not bring valuables to the hospital.  [facility] is not responsible for any belongings or valuables.  Contacts, dentures or bridgework may not be worn into surgery.  Leave your suitcase in the car.  After surgery it may be brought to your room.  For patients admitted to the hospital, discharge time will be determined by your treatment team.  Patients discharged the day of surgery will not be allowed to drive home and must have someone with them for 24 hours.    Special instructions:   DO NOT smoke tobacco or vape for 24 hours before your procedure.  Please read over the following fact sheets that you were given. Anesthesia Post-op Instructions and Care and Recovery After Surgery      Upper Endoscopy, Adult, Care After After the procedure, it is common to have a sore throat. It is also common to have: Mild stomach pain or discomfort. Bloating. Nausea. Follow these instructions at home: The instructions below may help you care for yourself at home. Your health care provider may  give you more instructions. If you have questions, ask your health care provider. If you were given a sedative during the procedure, it can affect you for several hours. Do not drive or operate machinery until your health care provider says that it is safe. If you will be going home right after the procedure, plan to have a responsible adult: Take you home from the hospital or clinic. You will not be allowed to drive. Care for you for the time you are told. Follow instructions from your health care provider about what you may eat and drink. Return to your normal activities as told by your health care provider. Ask your health care provider what activities are safe for you. Take over-the-counter and prescription medicines only as told by your health care provider. Contact a health care provider if you: Have a sore throat that lasts longer than one day. Have trouble swallowing. Have a fever. Get help right away if you: Vomit blood or your vomit looks like coffee grounds. Have bloody, black, or tarry stools. Have a very bad sore throat or you cannot swallow. Have difficulty breathing or very bad pain in your chest or abdomen. These symptoms may be an emergency. Get help right away. Call 911. Do not  wait to see if the symptoms will go away. Do not drive yourself to the hospital. Summary After the procedure, it is common to have a sore throat, mild stomach discomfort, bloating, and nausea. If you were given a sedative during the procedure, it can affect you for several hours. Do not drive until your health care provider says that it is safe. Follow instructions from your health care provider about what you may eat and drink. Return to your normal activities as told by your health care provider. This information is not intended to replace advice given to you by your health care provider. Make sure you discuss any questions you have with your health care provider. Document Revised: 03/04/2022  Document Reviewed: 03/04/2022 Elsevier Patient Education  2024 [employer] Colonoscopy, Adult, Care After The following information offers guidance on how to care for yourself after your procedure. Your health care provider may also give you more specific instructions. If you have problems or questions, contact your health care provider. What can I expect after the procedure? After the procedure, it is common to have: A small amount of blood in your stool for 24 hours after the procedure. Some gas. Mild cramping or bloating of your abdomen. Follow these instructions at home: Eating and drinking  Drink enough fluid to keep your urine pale yellow. Follow instructions from your health care provider about eating or drinking restrictions. Resume your normal diet as told by your health care provider. Avoid heavy or fried foods that are hard to digest. Activity Rest as told by your health care provider. Avoid sitting for a long time without moving. Get up to take short walks every 1-2 hours. This is important to improve blood flow and breathing. Ask for help if you feel weak or unsteady. Return to your normal activities as told by your health care provider. Ask your health care provider what activities are safe for you. Managing cramping and bloating  Try walking around when you have cramps or feel bloated. If directed, apply heat to your abdomen as told by your health care provider. Use the heat source that your health care provider recommends, such as a moist heat pack or a heating pad. Place a towel between your skin and the heat source. Leave the heat on for 20-30 minutes. Remove the heat if your skin turns bright red. This is especially important if you are unable to feel pain, heat, or cold. You have a greater risk of getting burned. General instructions If you were given a sedative during the procedure, it can affect you for several hours. Do not drive or operate machinery until your  health care provider says that it is safe. For the first 24 hours after the procedure: Do not sign important documents. Do not drink alcohol. Do your regular daily activities at a slower pace than normal. Eat soft foods that are easy to digest. Take over-the-counter and prescription medicines only as told by your health care provider. Keep all follow-up visits. This is important. Contact a health care provider if: You have blood in your stool 2-3 days after the procedure. Get help right away if: You have more than a small spotting of blood in your stool. You have large blood clots in your stool. You have swelling of your abdomen. You have nausea or vomiting. You have a fever. You have increasing pain in your abdomen that is not relieved with medicine. These symptoms may be an emergency. Get help right away. Call 911. Do not  wait to see if the symptoms will go away. Do not drive yourself to the hospital. Summary After the procedure, it is common to have a small amount of blood in your stool. You may also have mild cramping and bloating of your abdomen. If you were given a sedative during the procedure, it can affect you for several hours. Do not drive or operate machinery until your health care provider says that it is safe. Get help right away if you have a lot of blood in your stool, nausea or vomiting, a fever, or increased pain in your abdomen. This information is not intended to replace advice given to you by your health care provider. Make sure you discuss any questions you have with your health care provider. Document Revised: 01/05/2023 Document Reviewed: 07/16/2021 Elsevier Patient Education  2024 [employer] Monitored Anesthesia Care, Care After The following information offers guidance on how to care for yourself after your procedure. Your health care provider may also give you more specific instructions. If you have problems or questions, contact your health care  provider. What can I expect after the procedure? After the procedure, it is common to have: Tiredness. Little or no memory about what happened during or after the procedure. Impaired judgment when it comes to making decisions. Nausea or vomiting. Some trouble with balance. Follow these instructions at home: For the time period you were told by your health care provider:  Rest. Do not participate in activities where you could fall or become injured. Do not drive or use machinery. Do not drink alcohol. Do not take sleeping pills or medicines that cause drowsiness. Do not make important decisions or sign legal documents. Do not take care of children on your own. Medicines Take over-the-counter and prescription medicines only as told by your health care provider. If you were prescribed antibiotics, take them as told by your health care provider. Do not stop using the antibiotic even if you start to feel better. Eating and drinking Follow instructions from your health care provider about what you may eat and drink. Drink enough fluid to keep your urine pale yellow. If you vomit: Drink clear fluids slowly and in small amounts as you are able. Clear fluids include water, ice chips, low-calorie sports drinks, and fruit juice that has water added to it (diluted fruit juice). Eat light and bland foods in small amounts as you are able. These foods include bananas, applesauce, rice, lean meats, toast, and crackers. General instructions  Have a responsible adult stay with you for the time you are told. It is important to have someone help care for you until you are awake and alert. If you have sleep apnea, surgery and some medicines can increase your risk for breathing problems. Follow instructions from your health care provider about wearing your sleep device: When you are sleeping. This includes during daytime naps. While taking prescription pain medicines, sleeping medicines, or medicines that  make you drowsy. Do not use any products that contain nicotine or tobacco. These products include cigarettes, chewing tobacco, and vaping devices, such as e-cigarettes. If you need help quitting, ask your health care provider. Contact a health care provider if: You feel nauseous or vomit every time you eat or drink. You feel light-headed. You are still sleepy or having trouble with balance after 24 hours. You get a rash. You have a fever. You have redness or swelling around the IV site. Get help right away if: You have trouble breathing. You have new confusion  after you get home. These symptoms may be an emergency. Get help right away. Call 911. Do not wait to see if the symptoms will go away. Do not drive yourself to the hospital. This information is not intended to replace advice given to you by your health care provider. Make sure you discuss any questions you have with your health care provider. Document Revised: 04/20/2022 Document Reviewed: 04/20/2022 Elsevier Patient Education  2024 [employer].

## 2023-07-15 ENCOUNTER — Encounter (HOSPITAL_COMMUNITY): Payer: Self-pay

## 2023-07-15 ENCOUNTER — Encounter (HOSPITAL_COMMUNITY)
Admission: RE | Admit: 2023-07-15 | Discharge: 2023-07-15 | Disposition: A | Discharge status: Home / Self Care | Payer: Medicaid Other | Source: Ambulatory Visit | Attending: Internal Medicine | Admitting: Internal Medicine

## 2023-07-15 ENCOUNTER — Telehealth: Payer: Self-pay | Admitting: *Deleted

## 2023-07-15 DIAGNOSIS — F17200 Nicotine dependence, unspecified, uncomplicated: Secondary | ICD-10-CM

## 2023-07-15 NOTE — Telephone Encounter (Signed)
Called pt, no answer and VM full

## 2023-07-15 NOTE — Telephone Encounter (Signed)
-----   Message from Nurse Evlyn Kanner sent at 07/15/2023  2:04 PM EDT ----- Regarding: No show for PAT Duwaine Maxin,  Ms Norwich was a no show for PAT  Thanks,  Cliffton Asters RN

## 2023-07-16 NOTE — Telephone Encounter (Signed)
Called pt, no answer and VM full. Not able to leave VM

## 2023-07-19 NOTE — Telephone Encounter (Signed)
Pt called and left vm stating she missed her pre-op appointment and her procedure is for today..  Called pt, unable to leave message due to mailbox being full

## 2023-07-20 ENCOUNTER — Encounter: Payer: Self-pay | Admitting: *Deleted

## 2023-07-20 NOTE — Telephone Encounter (Signed)
Pt has been rescheduled for 08/11/23. Updated instructions mailed

## 2023-07-25 ENCOUNTER — Other Ambulatory Visit: Payer: Self-pay | Admitting: Gastroenterology

## 2023-08-04 ENCOUNTER — Other Ambulatory Visit: Payer: Self-pay | Admitting: Gastroenterology

## 2023-08-04 ENCOUNTER — Encounter: Payer: Self-pay | Admitting: *Deleted

## 2023-08-04 ENCOUNTER — Telehealth: Payer: Self-pay | Admitting: *Deleted

## 2023-08-04 DIAGNOSIS — E611 Iron deficiency: Secondary | ICD-10-CM

## 2023-08-04 DIAGNOSIS — E876 Hypokalemia: Secondary | ICD-10-CM

## 2023-08-04 DIAGNOSIS — K7030 Alcoholic cirrhosis of liver without ascites: Secondary | ICD-10-CM

## 2023-08-04 MED ORDER — IRON (FERROUS SULFATE) 325 (65 FE) MG PO TABS
325.0000 mg | ORAL_TABLET | Freq: Every day | ORAL | 3 refills | Status: DC
Start: 2023-08-04 — End: 2023-10-05

## 2023-08-04 MED ORDER — FOLIC ACID 1 MG PO TABS: 1.0000 mg | ORAL_TABLET | Freq: Every day | ORAL | 1 refills | Status: DC

## 2023-08-04 MED ORDER — MIDODRINE HCL 2.5 MG PO TABS: 2.5000 mg | ORAL_TABLET | Freq: Three times a day (TID) | ORAL | 1 refills | Status: DC

## 2023-08-04 NOTE — Telephone Encounter (Signed)
Sent My-Chart message

## 2023-08-04 NOTE — Patient Instructions (Signed)
Pamela [name]  08/04/2023     @PREFPERIOPPHARMACY @   Your procedure is scheduled on  08/11/2023.   Report to [facility] at  0845  A.M.   Call this number if you have problems the morning of surgery:  [phone]  If you experience any cold or flu symptoms such as cough, fever, chills, shortness of breath, etc. between now and your scheduled surgery, please notify us at the above number.   Remember:  Follow the diet and prep instructions given to you by the office.    Take these medicines the morning of surgery with A SIP OF WATER                                  zofran, pantoprazole.     Do not wear jewelry, make-up or nail polish, including gel polish,  artificial nails, or any other type of covering on natural nails (fingers and  toes).  Do not wear lotions, powders, or perfumes, or deodorant.  Do not shave 48 hours prior to surgery.  Men may shave face and neck.  Do not bring valuables to the hospital.  [facility] is not responsible for any belongings or valuables.  Contacts, dentures or bridgework may not be worn into surgery.  Leave your suitcase in the car.  After surgery it may be brought to your room.  For patients admitted to the hospital, discharge time will be determined by your treatment team.  Patients discharged the day of surgery will not be allowed to drive home and must have someone with them for 24 hours.    Special instructions:   DO NOT smoke tobacco or vape for 24 hours before your procedure.  Please read over the following fact sheets that you were given. Anesthesia Post-op Instructions and Care and Recovery After Surgery         Upper Endoscopy, Adult, Care After After the procedure, it is common to have a sore throat. It is also common to have: Mild stomach pain or discomfort. Bloating. Nausea. Follow these instructions at home: The instructions below may help you care for yourself at home. Your health care provider may  give you more instructions. If you have questions, ask your health care provider. If you were given a sedative during the procedure, it can affect you for several hours. Do not drive or operate machinery until your health care provider says that it is safe. If you will be going home right after the procedure, plan to have a responsible adult: Take you home from the hospital or clinic. You will not be allowed to drive. Care for you for the time you are told. Follow instructions from your health care provider about what you may eat and drink. Return to your normal activities as told by your health care provider. Ask your health care provider what activities are safe for you. Take over-the-counter and prescription medicines only as told by your health care provider. Contact a health care provider if you: Have a sore throat that lasts longer than one day. Have trouble swallowing. Have a fever. Get help right away if you: Vomit blood or your vomit looks like coffee grounds. Have bloody, black, or tarry stools. Have a very bad sore throat or you cannot swallow. Have difficulty breathing or very bad pain in your chest or abdomen. These symptoms may be an emergency. Get help  right away. Call 911. Do not wait to see if the symptoms will go away. Do not drive yourself to the hospital. Summary After the procedure, it is common to have a sore throat, mild stomach discomfort, bloating, and nausea. If you were given a sedative during the procedure, it can affect you for several hours. Do not drive until your health care provider says that it is safe. Follow instructions from your health care provider about what you may eat and drink. Return to your normal activities as told by your health care provider. This information is not intended to replace advice given to you by your health care provider. Make sure you discuss any questions you have with your health care provider. Document Revised: 03/04/2022  Document Reviewed: 03/04/2022 Elsevier Patient Education  2024 [employer]. Colonoscopy, Adult, Care After The following information offers guidance on how to care for yourself after your procedure. Your health care provider may also give you more specific instructions. If you have problems or questions, contact your health care provider. What can I expect after the procedure? After the procedure, it is common to have: A small amount of blood in your stool for 24 hours after the procedure. Some gas. Mild cramping or bloating of your abdomen. Follow these instructions at home: Eating and drinking  Drink enough fluid to keep your urine pale yellow. Follow instructions from your health care provider about eating or drinking restrictions. Resume your normal diet as told by your health care provider. Avoid heavy or fried foods that are hard to digest. Activity Rest as told by your health care provider. Avoid sitting for a long time without moving. Get up to take short walks every 1-2 hours. This is important to improve blood flow and breathing. Ask for help if you feel weak or unsteady. Return to your normal activities as told by your health care provider. Ask your health care provider what activities are safe for you. Managing cramping and bloating  Try walking around when you have cramps or feel bloated. If directed, apply heat to your abdomen as told by your health care provider. Use the heat source that your health care provider recommends, such as a moist heat pack or a heating pad. Place a towel between your skin and the heat source. Leave the heat on for 20-30 minutes. Remove the heat if your skin turns bright red. This is especially important if you are unable to feel pain, heat, or cold. You have a greater risk of getting burned. General instructions If you were given a sedative during the procedure, it can affect you for several hours. Do not drive or operate machinery until your  health care provider says that it is safe. For the first 24 hours after the procedure: Do not sign important documents. Do not drink alcohol. Do your regular daily activities at a slower pace than normal. Eat soft foods that are easy to digest. Take over-the-counter and prescription medicines only as told by your health care provider. Keep all follow-up visits. This is important. Contact a health care provider if: You have blood in your stool 2-3 days after the procedure. Get help right away if: You have more than a small spotting of blood in your stool. You have large blood clots in your stool. You have swelling of your abdomen. You have nausea or vomiting. You have a fever. You have increasing pain in your abdomen that is not relieved with medicine. These symptoms may be an emergency. Get help  right away. Call 911. Do not wait to see if the symptoms will go away. Do not drive yourself to the hospital. Summary After the procedure, it is common to have a small amount of blood in your stool. You may also have mild cramping and bloating of your abdomen. If you were given a sedative during the procedure, it can affect you for several hours. Do not drive or operate machinery until your health care provider says that it is safe. Get help right away if you have a lot of blood in your stool, nausea or vomiting, a fever, or increased pain in your abdomen. This information is not intended to replace advice given to you by your health care provider. Make sure you discuss any questions you have with your health care provider. Document Revised: 01/05/2023 Document Reviewed: 07/16/2021 Elsevier Patient Education  2024 [employer]. Monitored Anesthesia Care, Care After The following information offers guidance on how to care for yourself after your procedure. Your health care provider may also give you more specific instructions. If you have problems or questions, contact your health care  provider. What can I expect after the procedure? After the procedure, it is common to have: Tiredness. Little or no memory about what happened during or after the procedure. Impaired judgment when it comes to making decisions. Nausea or vomiting. Some trouble with balance. Follow these instructions at home: For the time period you were told by your health care provider:  Rest. Do not participate in activities where you could fall or become injured. Do not drive or use machinery. Do not drink alcohol. Do not take sleeping pills or medicines that cause drowsiness. Do not make important decisions or sign legal documents. Do not take care of children on your own. Medicines Take over-the-counter and prescription medicines only as told by your health care provider. If you were prescribed antibiotics, take them as told by your health care provider. Do not stop using the antibiotic even if you start to feel better. Eating and drinking Follow instructions from your health care provider about what you may eat and drink. Drink enough fluid to keep your urine pale yellow. If you vomit: Drink clear fluids slowly and in small amounts as you are able. Clear fluids include water, ice chips, low-calorie sports drinks, and fruit juice that has water added to it (diluted fruit juice). Eat light and bland foods in small amounts as you are able. These foods include bananas, applesauce, rice, lean meats, toast, and crackers. General instructions  Have a responsible adult stay with you for the time you are told. It is important to have someone help care for you until you are awake and alert. If you have sleep apnea, surgery and some medicines can increase your risk for breathing problems. Follow instructions from your health care provider about wearing your sleep device: When you are sleeping. This includes during daytime naps. While taking prescription pain medicines, sleeping medicines, or medicines that  make you drowsy. Do not use any products that contain nicotine or tobacco. These products include cigarettes, chewing tobacco, and vaping devices, such as e-cigarettes. If you need help quitting, ask your health care provider. Contact a health care provider if: You feel nauseous or vomit every time you eat or drink. You feel light-headed. You are still sleepy or having trouble with balance after 24 hours. You get a rash. You have a fever. You have redness or swelling around the IV site. Get help right away if: You have  trouble breathing. You have new confusion after you get home. These symptoms may be an emergency. Get help right away. Call 911. Do not wait to see if the symptoms will go away. Do not drive yourself to the hospital. This information is not intended to replace advice given to you by your health care provider. Make sure you discuss any questions you have with your health care provider. Document Revised: 04/20/2022 Document Reviewed: 04/20/2022 Elsevier Patient Education  2024 [employer].

## 2023-08-04 NOTE — Telephone Encounter (Signed)
Received refill request for folic acid, ferrous sulfate and midodrine. Pt last OV 06/17/2023

## 2023-08-05 ENCOUNTER — Encounter (HOSPITAL_COMMUNITY): Payer: Self-pay

## 2023-08-05 ENCOUNTER — Encounter (HOSPITAL_COMMUNITY)
Admission: RE | Admit: 2023-08-05 | Discharge: 2023-08-05 | Disposition: A | Discharge status: Home / Self Care | Payer: Medicaid Other | Source: Ambulatory Visit | Attending: Internal Medicine | Admitting: Internal Medicine

## 2023-08-05 DIAGNOSIS — R9431 Abnormal electrocardiogram [ECG] [EKG]: Secondary | ICD-10-CM | POA: Diagnosis not present

## 2023-08-05 DIAGNOSIS — Z01810 Encounter for preprocedural cardiovascular examination: Secondary | ICD-10-CM | POA: Insufficient documentation

## 2023-08-05 DIAGNOSIS — Z01818 Encounter for other preprocedural examination: Secondary | ICD-10-CM | POA: Diagnosis present

## 2023-08-05 DIAGNOSIS — F17200 Nicotine dependence, unspecified, uncomplicated: Secondary | ICD-10-CM

## 2023-08-11 ENCOUNTER — Ambulatory Visit (HOSPITAL_COMMUNITY)
Admission: RE | Admit: 2023-08-11 | Discharge: 2023-08-11 | Disposition: A | Discharge status: Home / Self Care | Payer: Medicaid Other | Source: Ambulatory Visit | Attending: Internal Medicine | Admitting: Internal Medicine

## 2023-08-11 ENCOUNTER — Ambulatory Visit (HOSPITAL_COMMUNITY): Payer: Medicaid Other | Admitting: Anesthesiology

## 2023-08-11 ENCOUNTER — Encounter (HOSPITAL_COMMUNITY)
Admission: RE | Disposition: A | Discharge status: Home / Self Care | Payer: Self-pay | Source: Ambulatory Visit | Attending: Internal Medicine

## 2023-08-11 ENCOUNTER — Encounter (HOSPITAL_COMMUNITY): Payer: Self-pay

## 2023-08-11 ENCOUNTER — Ambulatory Visit (HOSPITAL_BASED_OUTPATIENT_CLINIC_OR_DEPARTMENT_OTHER): Payer: Medicaid Other | Admitting: Anesthesiology

## 2023-08-11 DIAGNOSIS — Z5986 Financial insecurity: Secondary | ICD-10-CM | POA: Insufficient documentation

## 2023-08-11 DIAGNOSIS — K219 Gastro-esophageal reflux disease without esophagitis: Secondary | ICD-10-CM | POA: Insufficient documentation

## 2023-08-11 DIAGNOSIS — R948 Abnormal results of function studies of other organs and systems: Secondary | ICD-10-CM | POA: Diagnosis not present

## 2023-08-11 DIAGNOSIS — K222 Esophageal obstruction: Secondary | ICD-10-CM | POA: Insufficient documentation

## 2023-08-11 DIAGNOSIS — K7460 Unspecified cirrhosis of liver: Secondary | ICD-10-CM | POA: Insufficient documentation

## 2023-08-11 DIAGNOSIS — K766 Portal hypertension: Secondary | ICD-10-CM | POA: Insufficient documentation

## 2023-08-11 DIAGNOSIS — R933 Abnormal findings on diagnostic imaging of other parts of digestive tract: Secondary | ICD-10-CM | POA: Insufficient documentation

## 2023-08-11 DIAGNOSIS — F17210 Nicotine dependence, cigarettes, uncomplicated: Secondary | ICD-10-CM

## 2023-08-11 DIAGNOSIS — D509 Iron deficiency anemia, unspecified: Secondary | ICD-10-CM | POA: Insufficient documentation

## 2023-08-11 DIAGNOSIS — K3189 Other diseases of stomach and duodenum: Secondary | ICD-10-CM | POA: Insufficient documentation

## 2023-08-11 DIAGNOSIS — Z79899 Other long term (current) drug therapy: Secondary | ICD-10-CM | POA: Insufficient documentation

## 2023-08-11 DIAGNOSIS — I10 Essential (primary) hypertension: Secondary | ICD-10-CM | POA: Diagnosis not present

## 2023-08-11 DIAGNOSIS — E611 Iron deficiency: Secondary | ICD-10-CM

## 2023-08-11 HISTORY — PX: FLEXIBLE SIGMOIDOSCOPY: SHX5431

## 2023-08-11 HISTORY — PX: ESOPHAGOGASTRODUODENOSCOPY (EGD) WITH PROPOFOL: SHX5813

## 2023-08-11 SURGERY — SIGMOIDOSCOPY, FLEXIBLE
Anesthesia: General

## 2023-08-11 MED ORDER — LACTATED RINGERS IV SOLN: INTRAVENOUS | Status: DC | PRN

## 2023-08-11 MED ORDER — PROPOFOL 500 MG/50ML IV EMUL
INTRAVENOUS | Status: DC | PRN
  Administered 2023-08-11: 200 ug/kg/min via INTRAVENOUS

## 2023-08-11 MED ORDER — MIDAZOLAM HCL 2 MG/2ML IJ SOLN
INTRAMUSCULAR | Status: DC | PRN
  Administered 2023-08-11: 2 mg via INTRAVENOUS

## 2023-08-11 MED ORDER — PROPOFOL 10 MG/ML IV BOLUS
INTRAVENOUS | Status: DC | PRN
  Administered 2023-08-11: 50 mg via INTRAVENOUS
  Administered 2023-08-11: 100 mg via INTRAVENOUS

## 2023-08-11 MED ORDER — LIDOCAINE HCL (CARDIAC) PF 100 MG/5ML IV SOSY
PREFILLED_SYRINGE | INTRAVENOUS | Status: DC | PRN
  Administered 2023-08-11: 50 mg via INTRAVENOUS

## 2023-08-11 MED ORDER — MIDAZOLAM HCL 2 MG/2ML IJ SOLN
INTRAMUSCULAR | Status: AC
  Filled 2023-08-11: qty 2

## 2023-08-11 MED ORDER — ONDANSETRON 8 MG PO TBDP: 8.0000 mg | ORAL_TABLET | Freq: Three times a day (TID) | ORAL | 0 refills | Status: DC | PRN

## 2023-08-11 NOTE — Transfer of Care (Signed)
Immediate Anesthesia Transfer of Care Note  Patient: Pamela [name]  Procedure(s) Performed: FLEXIBLE SIGMOIDOSCOPY ESOPHAGOGASTRODUODENOSCOPY (EGD) WITH PROPOFOL  Patient Location: Short Stay  Anesthesia Type:General  Level of Consciousness: awake  Airway & Oxygen Therapy: Patient Spontanous Breathing  Post-op Assessment: Report given to RN and Post -op Vital signs reviewed and stable  Post vital signs: Reviewed and stable  Last Vitals:  Vitals Value Taken Time  BP 105/60 08/11/23 1132  Temp 36.6 C 08/11/23 1132  Pulse 95 08/11/23 1132  Resp 13 08/11/23 1132  SpO2 100 % 08/11/23 1132    Last Pain:  Vitals:   08/11/23 1132  TempSrc: Oral  PainSc: 0-No pain         Complications: No notable events documented.

## 2023-08-11 NOTE — Op Note (Signed)
[facility] Patient Name: Pamela [name] Procedure Date: 08/11/2023 11:07 AM MRN: [id number] Date of Birth: 1981-06-26 Attending MD: Hennie Duos. Marletta Lor , [place], [id number] CSN: [id number] Age: 42 Admit Type: Outpatient Procedure:                Upper GI endoscopy Indications:              Cirrhosis rule out esophageal varices Providers:                Hennie Duos. Marletta Lor, DO, Crystal Page, Dyann Ruddle Referring MD:              Medicines:                See the Anesthesia note for documentation of the                            administered medications Complications:            No immediate complications. Estimated Blood Loss:     Estimated blood loss: none. Procedure:                Pre-Anesthesia Assessment:                           - The anesthesia plan was to use monitored                            anesthesia care (MAC).                           After obtaining informed consent, the endoscope was                            passed under direct vision. Throughout the                            procedure, the patient's blood pressure, pulse, and                            oxygen saturations were monitored continuously. The                            [id number]-H190 ([id number]) scope was introduced through the                            mouth, and advanced to the second part of duodenum.                            The upper GI endoscopy was accomplished without                            difficulty. The patient tolerated the procedure                            well. Scope In: 11:18:50 AM Scope Out: 11:22:07 AM Total Procedure Duration: 0 hours 3 minutes 17 seconds  Findings:      There is no endoscopic evidence of varices  in the entire esophagus.      A non-obstructing Schatzki ring was found in the distal esophagus.      Moderate portal hypertensive gastropathy was found in the entire       examined stomach.      The duodenal bulb, first portion of the duodenum and second portion of        the duodenum were normal. Impression:               - Non-obstructing Schatzki ring.                           - Portal hypertensive gastropathy.                           - Normal duodenal bulb, first portion of the                            duodenum and second portion of the duodenum.                           - No specimens collected. Moderate Sedation:      Per Anesthesia Care Recommendation:           - Patient has a contact number available for                            emergencies. The signs and symptoms of potential                            delayed complications were discussed with the                            patient. Return to normal activities tomorrow.                            Written discharge instructions were provided to the                            patient.                           - Resume previous diet.                           - Continue present medications.                           - Repeat upper endoscopy in 3 years for screening                            purposes.                           - Return to GI clinic as previously scheduled. Procedure Code(s):        --- Professional ---  41324, Esophagogastroduodenoscopy, flexible,                            transoral; diagnostic, including collection of                            specimen(s) by brushing or washing, when performed                            (separate procedure) Diagnosis Code(s):        --- Professional ---                           K22.2, Esophageal obstruction                           K76.6, Portal hypertension                           K31.89, Other diseases of stomach and duodenum                           K74.60, Unspecified cirrhosis of liver CPT copyright 2022 American Medical Association. All rights reserved. The codes documented in this report are preliminary and upon coder review may  be revised to meet current compliance requirements. Hennie Duos.  Marletta Lor, DO Hennie Duos. Marletta Lor, DO 08/11/2023 11:29:40 AM This report has been signed electronically. Number of Addenda: 0

## 2023-08-11 NOTE — Anesthesia Postprocedure Evaluation (Signed)
Anesthesia Post Note  Patient: Pamela [name]  Procedure(s) Performed: FLEXIBLE SIGMOIDOSCOPY ESOPHAGOGASTRODUODENOSCOPY (EGD) WITH PROPOFOL  Patient location during evaluation: Phase II Anesthesia Type: General Level of consciousness: awake and alert and oriented Pain management: pain level controlled Vital Signs Assessment: post-procedure vital signs reviewed and stable Respiratory status: spontaneous breathing, nonlabored ventilation and respiratory function stable Cardiovascular status: blood pressure returned to baseline and stable Postop Assessment: no apparent nausea or vomiting Anesthetic complications: no  No notable events documented.   Last Vitals:  Vitals:   08/11/23 0948 08/11/23 1132  BP: 117/77 105/60  Pulse: 85 95  Resp: 11 13  Temp: 36.7 C 36.6 C  SpO2: 100% 100%    Last Pain:  Vitals:   08/11/23 1132  TempSrc: Oral  PainSc: 0-No pain                 Ayven Glasco C Adna Nofziger

## 2023-08-11 NOTE — H&P (Signed)
Primary Care Physician:  Babs Sciara, MD Primary Gastroenterologist:  Dr. Marletta Lor  Pre-Procedure History & Physical: HPI:  Pamela [name] is a 42 y.o. female is here for an EGD to be performed for cirrhosis/variceal screening and colonoscopy for abnormal CT of colon/anemia  Past Medical History:  Diagnosis Date   Abnormal vaginal Pap smear    ASCUS of cervix with negative high risk HPV 09/01/2021   Repeat papin 3 years, per [facility], 5 year risk for CIN 3+ is 0.40%   Depression    Elevated liver enzymes 08/01/2021   Stop alcohol, recheck at appt, get [place] 9/1   Migraines    Neuropathy    Pregnant 06/19/2015   Sludge in gallbladder 09/01/2021   Refer to Dr Lovell Sheehan   Vaginal Pap smear, abnormal     Past Surgical History:  Procedure Laterality Date   BACK SURGERY     CESAREAN SECTION  x 2   CESAREAN SECTION WITH BILATERAL TUBAL LIGATION Bilateral 02/11/2016   Procedure: REPEAT CESAREAN SECTION WITH BILATERAL TUBAL LIGATION;  Surgeon: Tilda Burrow, MD;  Location: [facility];  Service: Obstetrics;  Laterality: Bilateral;   CHOLECYSTECTOMY N/A 10/22/2021   Procedure: LAPAROSCOPIC CHOLECYSTECTOMY;  Surgeon: Franky Macho, MD;  Location: [facility];  Service: General;  Laterality: N/A;   TONSILLECTOMY AND ADENOIDECTOMY      Prior to Admission medications   Medication Sig Start Date End Date Taking? Authorizing Provider  folic acid (FOLVITE) 1 MG tablet Take 1 tablet (1 mg total) by mouth daily. 08/04/23  Yes Mahon, Frederik Schmidt, NP  furosemide (LASIX) 40 MG tablet TAKE 1 TABLET BY MOUTH TWICE DAILY FOR 5 DAYS 07/26/23  Yes Mahon, Frederik Schmidt, NP  Iron, Ferrous Sulfate, 325 (65 Fe) MG TABS Take 325 mg by mouth daily. 08/04/23  Yes Mahon, Courtney L, NP  midodrine (PROAMATINE) 2.5 MG tablet Take 1 tablet (2.5 mg total) by mouth 3 (three) times daily with meals. Notify provider if BP >130/70 consistently. 08/04/23  Yes Mahon, Frederik Schmidt, NP  ondansetron (ZOFRAN-ODT) 8 MG disintegrating tablet Take 1  tablet (8 mg total) by mouth every 8 (eight) hours as needed for nausea or vomiting. 05/29/23  Yes Vassie Loll, MD  pantoprazole (PROTONIX) 40 MG tablet Take 1 tablet (40 mg total) by mouth daily. 05/29/23 05/28/24 Yes Vassie Loll, MD  POTASSIUM GLUCONATE PO Take 90 mg by mouth daily.   Yes [provider]  SUBOXONE 8-2 MG FILM Take 1 Film by mouth daily. 04/17/23  Yes [provider]  cephALEXin (KEFLEX) 500 MG capsule Take 1 capsule (500 mg total) by mouth 4 (four) times daily. 06/12/23   Eber Hong, MD    Allergies as of 06/28/2023 - Review Complete 06/18/2023  Allergen Reaction Noted   Zomig [zolmitriptan] Shortness Of Breath and Swelling 07/30/2011    Family History  Problem Relation Age of Onset   Dementia Paternal Grandfather    Dementia Paternal Grandmother     Social History   Socioeconomic History   Marital status: Divorced    Spouse name: Not on file   Number of children: Not on file   Years of education: 2 yr coll.   Highest education level: Not on file  Occupational History   Occupation: event planner  Tobacco Use   Smoking status: Every Day    Current packs/day: 1.00    Average packs/day: 1 pack/day for 19.1 years (19.1 ttl pk-yrs)    Types: Cigarettes    Start date: 08/02/2007  Smokeless tobacco: Never   Tobacco comments:    smokes 6 cig per day  Vaping Use   Vaping status: Never Used  Substance and Sexual Activity   Alcohol use: Yes    Comment: occ   Drug use: No    Types: Marijuana   Sexual activity: Not Currently    Birth control/protection: Surgical    Comment: tubal  Other Topics Concern   Not on file  Social History Narrative   Not on file   Social Determinants of Health   Financial Resource Strain: High Risk (08/20/2021)   Overall Financial Resource Strain (CARDIA)    Difficulty of Paying Living Expenses: Very hard  Food Insecurity: No Food Insecurity (05/25/2023)   Hunger Vital Sign    Worried About Running Out of  Food in the Last Year: Never true    Ran Out of Food in the Last Year: Never true  Transportation Needs: No Transportation Needs (05/25/2023)   PRAPARE - Administrator, Civil Service (Medical): No    Lack of Transportation (Non-Medical): No  Physical Activity: Inactive (08/20/2021)   Exercise Vital Sign    Days of Exercise per Week: 0 days    Minutes of Exercise per Session: 0 min  Stress: Stress Concern Present (08/20/2021)   [employer] of Occupational Health - Occupational Stress Questionnaire    Feeling of Stress : Very much  Social Connections: Socially Isolated (08/20/2021)   Social Connection and Isolation Panel [NHANES]    Frequency of Communication with Friends and Family: Three times a week    Frequency of Social Gatherings with Friends and Family: Never    Attends Religious Services: Never    Database administrator or Organizations: No    Attends Banker Meetings: Never    Marital Status: Divorced  Catering manager Violence: Not At Risk (05/25/2023)   Humiliation, Afraid, Rape, and Kick questionnaire    Fear of Current or Ex-Partner: No    Emotionally Abused: No    Physically Abused: No    Sexually Abused: No    Review of Systems: General: Negative for fever, chills, fatigue, weakness. Eyes: Negative for vision changes.  ENT: Negative for hoarseness, difficulty swallowing , nasal congestion. CV: Negative for chest pain, angina, palpitations, dyspnea on exertion, peripheral edema.  Respiratory: Negative for dyspnea at rest, dyspnea on exertion, cough, sputum, wheezing.  GI: See history of present illness. GU:  Negative for dysuria, hematuria, urinary incontinence, urinary frequency, nocturnal urination.  MS: Negative for joint pain, low back pain.  Derm: Negative for rash or itching.  Neuro: Negative for weakness, abnormal sensation, seizure, frequent headaches, memory loss, confusion.  Psych: Negative for anxiety, depression Endo: Negative  for unusual weight change.  Heme: Negative for bruising or bleeding. Allergy: Negative for rash or hives.  Physical Exam: Vital signs in last 24 hours: Temp:  [98 F (36.7 C)] 98 F (36.7 C) (09/04 0948) Pulse Rate:  [85] 85 (09/04 0948) Resp:  [11] 11 (09/04 0948) BP: (117)/(77) 117/77 (09/04 0948) SpO2:  [100 %] 100 % (09/04 0948)   General:   Alert,  Well-developed, well-nourished, pleasant and cooperative in NAD Head:  Normocephalic and atraumatic. Eyes:  Sclera clear, no icterus.   Conjunctiva pink. Ears:  Normal auditory acuity. Nose:  No deformity, discharge,  or lesions. Msk:  Symmetrical without gross deformities. Normal posture. Extremities:  Without clubbing or edema. Neurologic:  Alert and  oriented x4;  grossly normal neurologically. Skin:  Intact without  significant lesions or rashes. Psych:  Alert and cooperative. Normal mood and affect.   Impression/Plan: Pamela [name] is here for an EGD to be performed for cirrhosis/variceal screening and colonoscopy for abnormal CT of colon/anemia  Risks, benefits, limitations, imponderables and alternatives regarding procedure have been reviewed with the patient. Questions have been answered. All parties agreeable.

## 2023-08-11 NOTE — Op Note (Signed)
[facility] Patient Name: Pamela [name] Procedure Date: 08/11/2023 11:06 AM MRN: [id number] Date of Birth: [birth year] Attending MD: Hennie Duos. Marletta Lor , [place], [id number] CSN: [id number] Age: 42 Admit Type: Outpatient Procedure:                Colonoscopy Indications:              Iron deficiency anemia, Abnormal CT of the GI tract Providers:                Hennie Duos. Marletta Lor, DO, Crystal Page, Dyann Ruddle,                            Kristine L. Jessee Avers, Technician Referring MD:              Medicines:                See the Anesthesia note for documentation of the                            administered medications Complications:            No immediate complications. Estimated Blood Loss:     Estimated blood loss: none. Procedure:                Pre-Anesthesia Assessment:                           - The anesthesia plan was to use monitored                            anesthesia care (MAC).                           After obtaining informed consent, the colonoscope                            was passed under direct vision. Throughout the                            procedure, the patient's blood pressure, pulse, and                            oxygen saturations were monitored continuously. The                            [id number]-HQ190L ([id number]) scope was introduced through                            the anus with the intention of advancing to the                            cecum. The scope was advanced to the descending                            colon before the procedure was aborted. Medications  were given. The colonoscopy was performed without                            difficulty. The patient tolerated the procedure                            well. The quality of the bowel preparation was                            evaluated using the BBPS [facility] Bowel Preparation                            Scale) with scores of: Left Colon = 0 (unprepared,                             mucosa not seen due to solid stool that cannot be                            cleared or unseen proximal colon segment in a                            colonoscopy aborted due to inadequate bowel prep).                            The total BBPS score equals 0. The quality of the                            bowel preparation was inadequate. Scope In: 11:25:26 AM Scope Out: 11:27:04 AM Total Procedure Duration: 0 hours 1 minute 38 seconds  Findings:      Extensive amounts of semi-solid solid stool was found in the sigmoid       colon and in the descending colon, precluding visualization. Impression:               - Preparation of the colon was inadequate.                           - Stool in the sigmoid colon and in the descending                            colon.                           - No specimens collected. Moderate Sedation:      Per Anesthesia Care Recommendation:           - Patient has a contact number available for                            emergencies. The signs and symptoms of potential                            delayed complications were discussed with the  patient. Return to normal activities tomorrow.                            Written discharge instructions were provided to the                            patient.                           - Resume previous diet.                           - Continue present medications.                           - Repeat colonoscopy in 3 months because the bowel                            preparation was poor.                           - Return to GI clinic as previously scheduled.                           - Patient reports vomiting majority of her prep. Procedure Code(s):        --- Professional ---                           [phone], 53, Colonoscopy, flexible; diagnostic,                            including collection of specimen(s) by brushing or                            washing, when  performed (separate procedure) Diagnosis Code(s):        --- Professional ---                           D50.9, Iron deficiency anemia, unspecified                           R93.3, Abnormal findings on diagnostic imaging of                            other parts of digestive tract CPT copyright 2022 American Medical Association. All rights reserved. The codes documented in this report are preliminary and upon coder review may  be revised to meet current compliance requirements. Hennie Duos. Marletta Lor, DO Hennie Duos. Marletta Lor, DO 08/11/2023 11:31:42 AM This report has been signed electronically. Number of Addenda: 0

## 2023-08-11 NOTE — Discharge Instructions (Addendum)
EGD Discharge instructions Please read the instructions outlined below and refer to this sheet in the next few weeks. These discharge instructions provide you with general information on caring for yourself after you leave the hospital. Your doctor may also give you specific instructions. While your treatment has been planned according to the most current medical practices available, unavoidable complications occasionally occur. If you have any problems or questions after discharge, please call your doctor. ACTIVITY You may resume your regular activity but move at a slower pace for the next 24 hours.  Take frequent rest periods for the next 24 hours.  Walking will help expel (get rid of) the air and reduce the bloated feeling in your abdomen.  No driving for 24 hours (because of the anesthesia (medicine) used during the test).  You may shower.  Do not sign any important legal documents or operate any machinery for 24 hours (because of the anesthesia used during the test).  NUTRITION Drink plenty of fluids.  You may resume your normal diet.  Begin with a light meal and progress to your normal diet.  Avoid alcoholic beverages for 24 hours or as instructed by your caregiver.  MEDICATIONS You may resume your normal medications unless your caregiver tells you otherwise.  WHAT YOU CAN EXPECT TODAY You may experience abdominal discomfort such as a feeling of fullness or "gas" pains.  FOLLOW-UP Your doctor will discuss the results of your test with you.  SEEK IMMEDIATE MEDICAL ATTENTION IF ANY OF THE FOLLOWING OCCUR: Excessive nausea (feeling sick to your stomach) and/or vomiting.  Severe abdominal pain and distention (swelling).  Trouble swallowing.  Temperature over 101 F (37.8 C).  Rectal bleeding or vomiting of blood.     Colonoscopy Discharge Instructions  Read the instructions outlined below and refer to this sheet in the next few weeks. These discharge instructions provide you with  general information on caring for yourself after you leave the hospital. Your doctor may also give you specific instructions. While your treatment has been planned according to the most current medical practices available, unavoidable complications occasionally occur.   ACTIVITY You may resume your regular activity, but move at a slower pace for the next 24 hours.  Take frequent rest periods for the next 24 hours.  Walking will help get rid of the air and reduce the bloated feeling in your belly (abdomen).  No driving for 24 hours (because of the medicine (anesthesia) used during the test).   Do not sign any important legal documents or operate any machinery for 24 hours (because of the anesthesia used during the test).  NUTRITION Drink plenty of fluids.  You may resume your normal diet as instructed by your doctor.  Begin with a light meal and progress to your normal diet. Heavy or fried foods are harder to digest and may make you feel sick to your stomach (nauseated).  Avoid alcoholic beverages for 24 hours or as instructed.  MEDICATIONS You may resume your normal medications unless your doctor tells you otherwise.  WHAT YOU CAN EXPECT TODAY Some feelings of bloating in the abdomen.  Passage of more gas than usual.  Spotting of blood in your stool or on the toilet paper.  IF YOU HAD POLYPS REMOVED DURING THE COLONOSCOPY: No aspirin products for 7 days or as instructed.  No alcohol for 7 days or as instructed.  Eat a soft diet for the next 24 hours.  FINDING OUT THE RESULTS OF YOUR TEST Not all test results are  available during your visit. If your test results are not back during the visit, make an appointment with your caregiver to find out the results. Do not assume everything is normal if you have not heard from your caregiver or the medical facility. It is important for you to follow up on all of your test results.  SEEK IMMEDIATE MEDICAL ATTENTION IF: You have more than a spotting of  blood in your stool.  Your belly is swollen (abdominal distention).  You are nauseated or vomiting.  You have a temperature over 101.  You have abdominal pain or discomfort that is severe or gets worse throughout the day.   I did not see any evidence of varices throughout your entire esophagus.  You do have a condition called portal hypertensive gastropathy in your stomach which is related to your liver.  Small bowel appeared normal.  Repeat upper endoscopy in 3 years.  Unfortunately, your colon was not adequately prepped today for colonoscopy.   Would recommend repeat colonoscopy in 6 months with a different colon prep.  Follow-up in GI office as previously scheduled.  I hope you have a great rest of your week!  Hennie Duos. Marletta Lor, D.O. Gastroenterology and Hepatology [facility]

## 2023-08-11 NOTE — Anesthesia Preprocedure Evaluation (Signed)
Anesthesia Evaluation  Patient identified by MRN, date of birth, ID band Patient awake    Reviewed: Allergy & Precautions, H&P , NPO status , Patient's Chart, lab work & pertinent test results  Airway Mallampati: II  TM Distance: >3 FB Neck ROM: Full    Dental  (+) Dental Advisory Given, Missing, Poor Dentition, Chipped   Pulmonary Current Smoker and Patient abstained from smoking.   Pulmonary exam normal breath sounds clear to auscultation       Cardiovascular negative cardio ROS Normal cardiovascular exam Rhythm:Regular Rate:Normal  05-Aug-2023 08:20:10 [facility] ROUTINE RECORD 1980-12-19 (42 yr) Female Caucasian Vent. rate 89 BPM PR interval 150 ms QRS duration 76 ms QT/QTcB 402/489 ms P-R-T axes 80 82 70 Normal sinus rhythm Nonspecific ST abnormality Prolonged QT Abnormal ECG When compared with ECG of 20-Oct-2021 22:16,ST changes much improved PREVIOUS ECG IS PRESENT Confirmed by Carylon Perches [phone]) on 08/05/2023 6:01:04 PM   Neuro/Psych  Headaches PSYCHIATRIC DISORDERS Anxiety Depression     Neuromuscular disease (left sided sciatica)    GI/Hepatic ,GERD  Medicated,,(+) Cirrhosis     substance abuse  alcohol use  Endo/Other   Hyperthyroidism   Renal/GU negative Renal ROS  negative genitourinary   Musculoskeletal negative musculoskeletal ROS (+)    Abdominal   Peds negative pediatric ROS (+)  Hematology  (+) Blood dyscrasia, anemia   Anesthesia Other Findings   Reproductive/Obstetrics negative OB ROS                             Anesthesia Physical Anesthesia Plan  ASA: 3  Anesthesia Plan: General   Post-op Pain Management: Minimal or no pain anticipated   Induction: Intravenous  PONV Risk Score and Plan: 1 and Propofol infusion  Airway Management Planned: Nasal Cannula and Natural Airway  Additional Equipment:   Intra-op Plan:    Post-operative Plan:   Informed Consent: I have reviewed the patients History and Physical, chart, labs and discussed the procedure including the risks, benefits and alternatives for the proposed anesthesia with the patient or authorized representative who has indicated his/her understanding and acceptance.     Dental advisory given  Plan Discussed with: CRNA and Surgeon  Anesthesia Plan Comments:        Anesthesia Quick Evaluation

## 2023-08-18 ENCOUNTER — Encounter (HOSPITAL_COMMUNITY): Payer: Self-pay | Admitting: Internal Medicine

## 2023-09-07 NOTE — Progress Notes (Deleted)
GI Office Note    Referring Provider: Babs Sciara, MD Primary Care Physician:  Babs Sciara, MD Primary Gastroenterologist: Hennie Duos. Marletta Lor, DO  Date:  09/07/2023  ID:  Waldon Merl, DOB 1981-11-07, MRN [id number]   Chief Complaint   No chief complaint on file.  History of Present Illness  Jeaneane Barnier is a 42 y.o. female with a history of *** presenting today with complaint of   Seen during hospitalization in June 6/18-6/22.  She presented to the hospital with abdominal pain, nausea, vomiting, and jaundice.  Also with abdominal swelling.  Her presentation was acute alcoholic hepatitis in the setting of anasarca. Labs were checked regularly and she was started on prednisolone for alcoholic hepatitis.  PPI daily.  Recommended complete alcohol cessation.  Start iron therapy.  Plan for outpatient EGD and colonoscopy.  Lille score 0.627 therefore she was advised to be discharged without Prednisolone.  She is advised to have CBC, CMP, INR in 1 week and close follow-up in 1-2 weeks.  She was also advised that if sodium improves could consider low-dose furosemide and spironolactone.  Advised to consider lactulose if she presents any recurrent forgetfulness or changes in mental status.   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    CT liver/abdomen 05/26/23 IMPRESSION: 1. Stable appearing cirrhotic changes involving the liver with portal venous hypertension, HSM, extensive portal venous collaterals and splenorenal shunt. 2. No arterial phase enhancing lesions to suggest dysplastic nodules or HCC. 3. Small volume abdominal and upper pelvic ascites. 4. Colonic wall thickening and submucosal edema could suggest colitis or may be related to low albumin and mild ascites. Recommend correlation with any colonic symptoms such as diarrhea   Minimal ascites on [place] 05/28/23   ED visit 06/12/23 for edema. Lasix was increased to BID for 5 days  and advised recheck of electrolytes and follow with fluid restriction. Labs 06/12/23: WBC 12, hemoglobin 11.3, platelets 240, sodium 131, creatinine 0.43 33, alk phos 123, T. bili 7.1, lipase 37, ethanol <10.  Blood cultures negative.  INR 1.4  Last office visit 06/17/23. *** Check labs including autoimmune serologies.  2 g sodium diet.  Advised alcohol cessation.  Continue Lasix 40 mg daily and resume spironolactone 50 mg daily.  Advise continue iron and midodrine.  RUQ [place] in December.  MELD labs now and in 6 months.  Recommended EGD and colonoscopy.  Labs 06/21/23: ***  EGD 08/11/23: -***  Colonoscopy 08/11/23: -***  Today:    Current Outpatient Medications  Medication Sig Dispense Refill   cephALEXin (KEFLEX) 500 MG capsule Take 1 capsule (500 mg total) by mouth 4 (four) times daily. 40 capsule 0   folic acid (FOLVITE) 1 MG tablet Take 1 tablet (1 mg total) by mouth daily. 30 tablet 1   furosemide (LASIX) 40 MG tablet TAKE 1 TABLET BY MOUTH TWICE DAILY FOR 5 DAYS 30 tablet 2   Iron, Ferrous Sulfate, 325 (65 Fe) MG TABS Take 325 mg by mouth daily. 30 tablet 3   midodrine (PROAMATINE) 2.5 MG tablet Take 1 tablet (2.5 mg total) by mouth 3 (three) times daily with meals. Notify provider if BP >130/70 consistently. 90 tablet 1   ondansetron (ZOFRAN-ODT) 8 MG disintegrating tablet Take 1 tablet (8 mg total) by mouth every 8 (eight) hours as needed for nausea or vomiting. 30 tablet 0   pantoprazole (PROTONIX) 40 MG tablet Take 1 tablet (40 mg total) by mouth daily. 30 tablet  1   POTASSIUM GLUCONATE PO Take 90 mg by mouth daily.     SUBOXONE 8-2 MG FILM Take 1 Film by mouth daily.     No current facility-administered medications for this visit.    Past Medical History:  Diagnosis Date   Abnormal vaginal Pap smear    ASCUS of cervix with negative high risk HPV 09/01/2021   Repeat papin 3 years, per [facility], 5 year risk for CIN 3+ is 0.40%   Depression    Elevated liver enzymes 08/01/2021    Stop alcohol, recheck at appt, get [place] 9/1   Migraines    Neuropathy    Pregnant 06/19/2015   Sludge in gallbladder 09/01/2021   Refer to Dr Lovell Sheehan   Vaginal Pap smear, abnormal     Past Surgical History:  Procedure Laterality Date   BACK SURGERY     CESAREAN SECTION  x 2   CESAREAN SECTION WITH BILATERAL TUBAL LIGATION Bilateral 02/11/2016   Procedure: REPEAT CESAREAN SECTION WITH BILATERAL TUBAL LIGATION;  Surgeon: Tilda Burrow, MD;  Location: [facility];  Service: Obstetrics;  Laterality: Bilateral;   CHOLECYSTECTOMY N/A 10/22/2021   Procedure: LAPAROSCOPIC CHOLECYSTECTOMY;  Surgeon: Franky Macho, MD;  Location: [facility];  Service: General;  Laterality: N/A;   ESOPHAGOGASTRODUODENOSCOPY (EGD) WITH PROPOFOL N/A 08/11/2023   Procedure: ESOPHAGOGASTRODUODENOSCOPY (EGD) WITH PROPOFOL;  Surgeon: Lanelle Bal, DO;  Location: [facility];  Service: Endoscopy;  Laterality: N/A;   FLEXIBLE SIGMOIDOSCOPY N/A 08/11/2023   Procedure: FLEXIBLE SIGMOIDOSCOPY;  Surgeon: Lanelle Bal, DO;  Location: [facility];  Service: Endoscopy;  Laterality: N/A;  10:00 am, asa 3   TONSILLECTOMY AND ADENOIDECTOMY      Family History  Problem Relation Age of Onset   Dementia Paternal Grandfather    Dementia Paternal Grandmother     Allergies as of 09/08/2023 - Review Complete 08/11/2023  Allergen Reaction Noted   Zomig [zolmitriptan] Shortness Of Breath and Swelling 07/30/2011    Social History   Socioeconomic History   Marital status: Divorced    Spouse name: Not on file   Number of children: Not on file   Years of education: 2 yr coll.   Highest education level: Not on file  Occupational History   Occupation: event planner  Tobacco Use   Smoking status: Every Day    Current packs/day: 1.00    Average packs/day: 1 pack/day for 19.2 years (19.2 ttl pk-yrs)    Types: Cigarettes    Start date: 08/02/2007   Smokeless tobacco: Never   Tobacco comments:    smokes 6 cig per day  Vaping Use    Vaping status: Never Used  Substance and Sexual Activity   Alcohol use: Yes    Comment: occ   Drug use: No    Types: Marijuana   Sexual activity: Not Currently    Birth control/protection: Surgical    Comment: tubal  Other Topics Concern   Not on file  Social History Narrative   Not on file   Social Determinants of Health   Financial Resource Strain: High Risk (08/20/2021)   Overall Financial Resource Strain (CARDIA)    Difficulty of Paying Living Expenses: Very hard  Food Insecurity: No Food Insecurity (05/25/2023)   Hunger Vital Sign    Worried About Running Out of Food in the Last Year: Never true    Ran Out of Food in the Last Year: Never true  Transportation Needs: No Transportation Needs (05/25/2023)   PRAPARE - Transportation  Lack of Transportation (Medical): No    Lack of Transportation (Non-Medical): No  Physical Activity: Inactive (08/20/2021)   Exercise Vital Sign    Days of Exercise per Week: 0 days    Minutes of Exercise per Session: 0 min  Stress: Stress Concern Present (08/20/2021)   [employer] of Occupational Health - Occupational Stress Questionnaire    Feeling of Stress : Very much  Social Connections: Socially Isolated (08/20/2021)   Social Connection and Isolation Panel [NHANES]    Frequency of Communication with Friends and Family: Three times a week    Frequency of Social Gatherings with Friends and Family: Never    Attends Religious Services: Never    Database administrator or Organizations: No    Attends Engineer, structural: Never    Marital Status: Divorced     Review of Systems   Gen: Denies fever, chills, anorexia. Denies fatigue, weakness, weight loss.  CV: Denies chest pain, palpitations, syncope, peripheral edema, and claudication. Resp: Denies dyspnea at rest, cough, wheezing, coughing up blood, and pleurisy. GI: See HPI Derm: Denies rash, itching, dry skin Psych: Denies depression, anxiety, memory loss, confusion. No  homicidal or suicidal ideation.  Heme: Denies bruising, bleeding, and enlarged lymph nodes.   Physical Exam   LMP 02/13/2021 (Approximate) Comment: LMP WAS IN MARCH  General:   Alert and oriented. No distress noted. Pleasant and cooperative.  Head:  Normocephalic and atraumatic. Eyes:  Conjuctiva clear without scleral icterus. Mouth:  Oral mucosa pink and moist. Good dentition. No lesions. Lungs:  Clear to auscultation bilaterally. No wheezes, rales, or rhonchi. No distress.  Heart:  S1, S2 present without murmurs appreciated.  Abdomen:  +BS, soft, non-tender and non-distended. No rebound or guarding. No HSM or masses noted. Rectal: *** Msk:  Symmetrical without gross deformities. Normal posture. Extremities:  Without edema. Neurologic:  Alert and  oriented x4 Psych:  Alert and cooperative. Normal mood and affect.   Assessment  Annaleia Levengood is a 42 y.o. female with a history of *** presenting today with   Cirrhosis:  Alcoholic hepatitis:  Anemia:  PLAN   ***     Brooke Bonito, MSN, FNP-BC, AGACNP-BC [facility]

## 2023-09-08 ENCOUNTER — Ambulatory Visit: Payer: Medicaid Other | Admitting: Gastroenterology

## 2023-09-09 ENCOUNTER — Encounter: Payer: Self-pay | Admitting: Gastroenterology

## 2023-09-22 ENCOUNTER — Ambulatory Visit: Payer: Medicaid Other | Admitting: Family Medicine

## 2023-10-04 ENCOUNTER — Other Ambulatory Visit: Payer: Self-pay | Admitting: Gastroenterology

## 2023-10-05 ENCOUNTER — Ambulatory Visit: Payer: Medicaid Other | Admitting: Gastroenterology

## 2023-10-05 ENCOUNTER — Encounter: Payer: Self-pay | Admitting: Gastroenterology

## 2023-10-05 VITALS — BP 119/66 | HR 110 | Temp 97.6°F | Ht 64.0 in | Wt 118.4 lb

## 2023-10-05 DIAGNOSIS — D649 Anemia, unspecified: Secondary | ICD-10-CM

## 2023-10-05 DIAGNOSIS — K7010 Alcoholic hepatitis without ascites: Secondary | ICD-10-CM | POA: Diagnosis not present

## 2023-10-05 DIAGNOSIS — R5383 Other fatigue: Secondary | ICD-10-CM

## 2023-10-05 DIAGNOSIS — R110 Nausea: Secondary | ICD-10-CM

## 2023-10-05 DIAGNOSIS — E611 Iron deficiency: Secondary | ICD-10-CM

## 2023-10-05 DIAGNOSIS — K3189 Other diseases of stomach and duodenum: Secondary | ICD-10-CM

## 2023-10-05 DIAGNOSIS — R634 Abnormal weight loss: Secondary | ICD-10-CM

## 2023-10-05 DIAGNOSIS — K7011 Alcoholic hepatitis with ascites: Secondary | ICD-10-CM

## 2023-10-05 DIAGNOSIS — K7030 Alcoholic cirrhosis of liver without ascites: Secondary | ICD-10-CM

## 2023-10-05 DIAGNOSIS — K7460 Unspecified cirrhosis of liver: Secondary | ICD-10-CM

## 2023-10-05 DIAGNOSIS — K5900 Constipation, unspecified: Secondary | ICD-10-CM

## 2023-10-05 DIAGNOSIS — F1010 Alcohol abuse, uncomplicated: Secondary | ICD-10-CM

## 2023-10-05 MED ORDER — ONDANSETRON 8 MG PO TBDP: 8.0000 mg | ORAL_TABLET | Freq: Three times a day (TID) | ORAL | 0 refills | Status: DC | PRN

## 2023-10-05 MED ORDER — IRON (FERROUS SULFATE) 325 (65 FE) MG PO TABS: 325.0000 mg | ORAL_TABLET | Freq: Every day | ORAL | 3 refills | Status: AC

## 2023-10-05 MED ORDER — FOLIC ACID 1 MG PO TABS: 1.0000 mg | ORAL_TABLET | Freq: Every day | ORAL | 1 refills | Status: DC

## 2023-10-05 MED ORDER — PANTOPRAZOLE SODIUM 40 MG PO TBEC: 40.0000 mg | DELAYED_RELEASE_TABLET | Freq: Every day | ORAL | 5 refills | Status: DC

## 2023-10-05 MED ORDER — LACTULOSE 10 GM/15ML PO SOLN: 10.0000 g | Freq: Two times a day (BID) | ORAL | 1 refills | Status: DC

## 2023-10-05 NOTE — Patient Instructions (Addendum)
I have sent in refills of your iron and folic acid.  I have also sent in refills of your pantoprazole.  I would like for you to begin taking this again once daily in the mornings on empty stomach.  I have sent in lactulose for you to take 10 g twice daily to help control your constipation.  You can start about once a day and then increase to twice daily if needed.  The goal is for you to have at least 2 bowel movements daily.  Increase your water intake as able, making sure you are staying hydrated.  We will repeat your ultrasound in December to continue to evaluate for liver lesions.   Please try to monitor your weight at least weekly.  If you continue to lose weight please let me know.  Cirrhosis Lifestyle Recommendations:  High-protein diet from a primarily plant-based diet. Avoid red meat.  No raw or undercooked meat, seafood, or shellfish. Low-fat/cholesterol/carbohydrate diet. Limit sodium to no more than 2000 mg/day including everything that you eat and drink. Recommend at least 30 minutes of aerobic and resistance exercise 3 days/week. Limit Tylenol to 2000 mg daily.   I will see you in the office in 2-3 months for follow-up.  If you have any worsening constipation or worsening nausea/vomiting please call the office.  It was a pleasure to see you today. I want to create trusting relationships with patients. If you receive a survey regarding your visit,  I greatly appreciate you taking time to fill this out on paper or through your MyChart. I value your feedback.  Brooke Bonito, MSN, FNP-BC, AGACNP-BC [facility]

## 2023-10-05 NOTE — Progress Notes (Signed)
GI Office Note    Referring Provider: Babs Sciara, MD Primary Care Physician:  Babs Sciara, MD Primary Gastroenterologist: Hennie Duos. Marletta Lor, DO  Date:  10/05/2023  ID:  Waldon Merl, DOB 1981/09/14, MRN [id number]   Chief Complaint   Chief Complaint  Patient presents with   Follow-up    Follow up. Is weak and has nausea. Feels like she is drying up.    History of Present Illness  Pamela [name] is a 42 y.o. female with a history of depression, anemia, alcohol abuse and recent diagnosis of cirrhosis presenting today for follow-up.  Seen during hospitalization in June 6/18-6/22.  She presented to the hospital with abdominal pain, nausea, vomiting, and jaundice.  Also with abdominal swelling.  Her presentation was acute alcoholic hepatitis in the setting of anasarca. Labs were checked regularly and she was started on prednisolone for alcoholic hepatitis.  PPI daily.  Recommended complete alcohol cessation.  Start iron therapy.  Plan for outpatient EGD and colonoscopy.  Lille score 0.627 therefore she was advised to be discharged without Prednisolone.  She is advised to have CBC, CMP, INR in 1 week and close follow-up in 1-2 weeks.  She was also advised that if sodium improves could consider low-dose furosemide and spironolactone.  Advised to consider lactulose if she presents any recurrent forgetfulness or changes in mental status.   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    CT liver/abdomen 05/26/23 IMPRESSION: 1. Stable appearing cirrhotic changes involving the liver with portal venous hypertension, HSM, extensive portal venous collaterals and splenorenal shunt. 2. No arterial phase enhancing lesions to suggest dysplastic nodules or HCC. 3. Small volume abdominal and upper pelvic ascites. 4. Colonic wall thickening and submucosal edema could suggest colitis or may be related to low albumin and mild ascites. Recommend  correlation with any colonic symptoms such as diarrhea   Minimal ascites on [place] 05/28/23   ED visit 06/12/23 for edema. Lasix was increased to BID for 5 days and advised recheck of electrolytes and follow with fluid restriction. Labs 06/12/23: WBC 12, hemoglobin 11.3, platelets 240, sodium 131, creatinine 0.43 33, alk phos 123, T. bili 7.1, lipase 37, ethanol <10.  Blood cultures negative.  INR 1.4  Last office visit 06/17/2023. Abdominal distention improving.  Having 1-2 bowel movements per day.  Taking Lasix twice daily short-term, not currently on Aldactone.  With still having some peripheral edema.  Also recently treated for cellulitis.  Drinking large amounts of water daily previously.  History of cholecystectomy.  Taking iron.  Feeling constantly hungry. Scheduled for EGD and colonoscopy.  Autoimmune serologies ordered.  Advised 2 L fluid restriction and sodium restricted diet.  Ongoing alcohol cessation recommended.  Advised to resume Aldactone and continue Lasix 40 daily.  Labs 06/21/2023: Elevated ANA, IgA, and IgG negative ANA, AMA, ASMA, antiliver kidney antibody.  AFP and ceruloplasmin within normal limits  EGD 08/11/23: - Non- obstructing Schatzki ring.  - Portal hypertensive gastropathy.  - Normal duodenum.  - No specimens collected.  Flexible sigmoidoscopy 08/11/23:  - Preparation of the colon was inadequate.  - Stool in the sigmoid colon and in the descending colon.  - No specimens collected. - Advised repeat colonoscopy in 3 months.  - Reportedly vomited up majority of her prep.  Today:  Out of her iron pills. Every now and then she has some minor swelling in her ankles. Has stopped taking her lasix daily due to this.  Trying to stay hydrated. Has been constipated. States as soon as she tried to take the prep she would vomit it back up and tried to use nausea medication. Having hard little balls of stool and is having to strain and sometimes causes bleeding. Off pantoprazole right  now.   Some days she needs to use Zofran 3 times daily. When she is at home and nauseas she can eat jello and be okay. Job is causing.  Gets fatigued as well due to her issues. Having some occasional forgetfulness.   Feels like she is wasting away given her skin is sagging and she is feeling very itchy due to dry skin.   Had one drink on her birthday and had 3 shots of liquor. Felt bad next day.   MELD 3.0: 17 at 06/21/2023 11:26 AM MELD-Na: 17 at 06/21/2023 11:26 AM Calculated from: Serum Creatinine: 0.46 mg/dL (Using min of 1 mg/dL) at 03/31/9562 87:56 AM Serum Sodium: 135 mmol/L at 06/21/2023 11:26 AM Total Bilirubin: 3.9 mg/dL at 4/33/2951 88:41 AM Serum Albumin: 3.7 g/dL (Using max of 3.5 g/dL) at 6/60/6301 60:10 AM INR(ratio): 1.3 at 06/21/2023 11:26 AM Age at listing (hypothetical): 41 years Sex: Female at 06/21/2023 11:26 AM    Current Outpatient Medications  Medication Sig Dispense Refill   folic acid (FOLVITE) 1 MG tablet TAKE 1 TABLET BY MOUTH DAILY 30 tablet 1   furosemide (LASIX) 40 MG tablet TAKE 1 TABLET BY MOUTH TWICE DAILY FOR 5 DAYS 30 tablet 2   Iron, Ferrous Sulfate, 325 (65 Fe) MG TABS Take 325 mg by mouth daily. 30 tablet 3   midodrine (PROAMATINE) 2.5 MG tablet Take 1 tablet (2.5 mg total) by mouth 3 (three) times daily with meals. Notify provider if BP >130/70 consistently. 90 tablet 1   ondansetron (ZOFRAN-ODT) 8 MG disintegrating tablet Take 1 tablet (8 mg total) by mouth every 8 (eight) hours as needed for nausea or vomiting. 30 tablet 0   pantoprazole (PROTONIX) 40 MG tablet Take 1 tablet (40 mg total) by mouth daily. 30 tablet 1   POTASSIUM GLUCONATE PO Take 90 mg by mouth daily.     SUBOXONE 8-2 MG FILM Take 1 Film by mouth daily.     cephALEXin (KEFLEX) 500 MG capsule Take 1 capsule (500 mg total) by mouth 4 (four) times daily. (Patient not taking: Reported on 10/05/2023) 40 capsule 0   No current facility-administered medications for this visit.    Past  Medical History:  Diagnosis Date   Abnormal vaginal Pap smear    ASCUS of cervix with negative high risk HPV 09/01/2021   Repeat papin 3 years, per [facility], 5 year risk for CIN 3+ is 0.40%   Depression    Elevated liver enzymes 08/01/2021   Stop alcohol, recheck at appt, get [place] 9/1   Migraines    Neuropathy    Pregnant 06/19/2015   Sludge in gallbladder 09/01/2021   Refer to Dr Lovell Sheehan   Vaginal Pap smear, abnormal     Past Surgical History:  Procedure Laterality Date   BACK SURGERY     CESAREAN SECTION  x 2   CESAREAN SECTION WITH BILATERAL TUBAL LIGATION Bilateral 02/11/2016   Procedure: REPEAT CESAREAN SECTION WITH BILATERAL TUBAL LIGATION;  Surgeon: Tilda Burrow, MD;  Location: [facility];  Service: Obstetrics;  Laterality: Bilateral;   CHOLECYSTECTOMY N/A 10/22/2021   Procedure: LAPAROSCOPIC CHOLECYSTECTOMY;  Surgeon: Franky Macho, MD;  Location: [facility];  Service: General;  Laterality: N/A;   ESOPHAGOGASTRODUODENOSCOPY (  EGD) WITH PROPOFOL N/A 08/11/2023   Procedure: ESOPHAGOGASTRODUODENOSCOPY (EGD) WITH PROPOFOL;  Surgeon: Lanelle Bal, DO;  [facility];  Service: Endoscopy;  Laterality: N/A;   FLEXIBLE SIGMOIDOSCOPY N/A 08/11/2023   Procedure: FLEXIBLE SIGMOIDOSCOPY;  Surgeon: Lanelle Bal, DO;  [facility];  Service: Endoscopy;  Laterality: N/A;  10:00 am, asa 3   TONSILLECTOMY AND ADENOIDECTOMY      Family History  Problem Relation Age of Onset   Dementia Paternal Grandfather    Dementia Paternal Grandmother     Allergies as of 10/05/2023 - Review Complete 10/05/2023  Allergen Reaction Noted   Zomig [zolmitriptan] Shortness Of Breath and Swelling 07/30/2011    Social History   Socioeconomic History   Marital status: Divorced    Spouse name: Not on file   Number of children: Not on file   Years of education: 2 yr coll.   Highest education level: Not on file  Occupational History   Occupation: event planner  Tobacco Use   Smoking  status: Every Day    Current packs/day: 1.00    Average packs/day: 1 pack/day for 19.3 years (19.3 ttl pk-yrs)    Types: Cigarettes    Start date: 08/02/2007   Smokeless tobacco: Never   Tobacco comments:    smokes 6 cig per day  Vaping Use   Vaping status: Never Used  Substance and Sexual Activity   Alcohol use: Yes    Comment: occ   Drug use: No    Types: Marijuana   Sexual activity: Not Currently    Birth control/protection: Surgical    Comment: tubal  Other Topics Concern   Not on file  Social History Narrative   Not on file   Social Determinants of Health   Financial Resource Strain: High Risk (08/20/2021)   Overall Financial Resource Strain (CARDIA)    Difficulty of Paying Living Expenses: Very hard  Food Insecurity: No Food Insecurity (05/25/2023)   Hunger Vital Sign    Worried About Running Out of Food in the Last Year: Never true    Ran Out of Food in the Last Year: Never true  Transportation Needs: No Transportation Needs (05/25/2023)   PRAPARE - Administrator, Civil Service (Medical): No    Lack of Transportation (Non-Medical): No  Physical Activity: Inactive (08/20/2021)   Exercise Vital Sign    Days of Exercise per Week: 0 days    Minutes of Exercise per Session: 0 min  Stress: Stress Concern Present (08/20/2021)   [employer] of Occupational Health - Occupational Stress Questionnaire    Feeling of Stress : Very much  Social Connections: Socially Isolated (08/20/2021)   Social Connection and Isolation Panel [NHANES]    Frequency of Communication with Friends and Family: Three times a week    Frequency of Social Gatherings with Friends and Family: Never    Attends Religious Services: Never    Database administrator or Organizations: No    Attends Engineer, structural: Never    Marital Status: Divorced     Review of Systems   Gen: Denies fever, chills, anorexia. Denies fatigue, weakness, weight loss.  CV: Denies chest pain,  palpitations, syncope, peripheral edema, and claudication. Resp: Denies dyspnea at rest, cough, wheezing, coughing up blood, and pleurisy. GI: See HPI Derm: Denies rash, itching, dry skin Psych: Denies depression, anxiety, memory loss, confusion. No homicidal or suicidal ideation.  Heme: Denies bruising, bleeding, and enlarged lymph nodes.   Physical  Exam   BP 119/66 (BP Location: Right Arm, Patient Position: Sitting, Cuff Size: Normal)   Pulse (!) 110   Temp 97.6 F (36.4 C) (Temporal)   Ht 5\' 4"  (1.626 m)   Wt 118 lb 6.4 oz (53.7 kg)   LMP 02/13/2021 (Approximate) Comment: LMP WAS IN MARCH  BMI 20.32 kg/m   General:   Alert and oriented. No distress noted. Pleasant and cooperative.  Head:  Normocephalic and atraumatic. Eyes:  Conjuctiva clear without scleral icterus. Mouth:  Oral mucosa pink and moist. Good dentition. No lesions. Lungs:  Clear to auscultation bilaterally. No wheezes, rales, or rhonchi. No distress.  Heart:  S1, S2 present without murmurs appreciated.  Abdomen:  +BS, soft, non-tender and non-distended. No rebound or guarding. No HSM or masses noted. Rectal: deferred Msk:  Symmetrical without gross deformities. Normal posture. Extremities:  Without edema. Neurologic:  Alert and  oriented x4 Psych:  Alert and cooperative. Normal mood and affect.   Assessment  Pamela [name] is a 42 y.o. female with a history of depression, anemia, alcohol abuse and recent diagnosis of cirrhosis presenting today for follow-up.  Anemia: Hemoglobin 11.4 on prior admission with low iron saturation on iron studies.  Recently ran out of iron but has been taking this daily.  EGD with portal hypertensive gastropathy and no varices.  Colonoscopy with poor prep.  Needs to repeat but will plan to get better control of constipation first.  Cirrhosis, nausea:  -Last MELD score 17 as of labs from July. -Likely secondary to prior alcohol abuse but also possible component of  MASH/MASLD -Autoimmune workup negative.  Did have elevations in IgA and IgG but likely secondary to alcohol use. -Recent hospitalization with alcoholic hepatitis -Recent EGD without evidence of varices but with evidence of portal hypertensive gastropathy. -Previously on diuretics for significant peripheral edema and abdominal ascites.  Required paracentesis while inpatient.  Has been taking Lasix only as needed.  I am in agreements with this as no significant edema on exam today. -Recently without any peripheral edema or abdominal ascites.  Has had significant weight loss related to diuretics and nausea -EGD without concerning findings.  Colonoscopy with poor prep.  In need of repeat but need to control constipation and nausea first -Will plan to recheck LFTs, hemoglobin, and assess thyroid function -Having some mild forgetfulness but no overt HE, need to control constipation.  Will start lactulose. -Query possible uncontrolled acid reflux.  Advised for her to resume pantoprazole. -Advise ongoing low-sodium diet and avoidance of alcohol.   -Will continue midodrine, BP controlled. -Due for hepatoma screening in December.  Constipation: Currently having issues with straining and hard stools.  Had poor colonoscopy prep as she was not able to keep down the prep due to vomiting.  Has been feeling fatigued and ongoing nausea which could be secondary to constipation.  Alcoholic hepatitis: Previous alcohol abuse post loss of her child.  Had significantly elevated liver enzymes on admission previously with T. bili up to 16.2.  Most recent MELD 17 from labs in July.  Due for repeat MELD labs in January.  Will recheck LFTs and electrolytes today.  Has had 1 alcoholic drink since her hospitalization.  PLAN   CBC, CMP, TSH + T4 RUQ [place] in December. MELD labs in January  Start lactulose 10 g twice daily. Continue Zofran up to 3 times a day as needed.  Refilled. Resume pantoprazole 40 mg once daily.  Refilled.  Continue Lasix 40 daily as needed Continue midodrine  Continue iron daily.  Refilled. Continue folic acid daily. Refilled.  2g sodium diet Increase water intake Continue alcohol cessation. Will discuss rescheduling colonoscopy after improvement of nausea and constipation Follow up in 2-3 months.     Brooke Bonito, MSN, FNP-BC, AGACNP-BC [facility]

## 2023-12-03 ENCOUNTER — Other Ambulatory Visit: Payer: Self-pay | Admitting: Gastroenterology

## 2024-01-03 NOTE — Progress Notes (Deleted)
GI Office Note    Referring Provider: Babs Sciara, MD Primary Care Physician:  Babs Sciara, MD Primary Gastroenterologist:  Date:  01/03/2024  ID:  Pamela [name], DOB 1981/10/28, MRN [id number]   Chief Complaint   No chief complaint on file.  History of Present Illness  Pamela [name] is a 43 y.o. female with a history of *** presenting today with complaint of     Last office visit 10/05/23.***. Advised to check CBC, CMP, TSH.  Plan for RUQ [place] in December and MELD labs in January.  Advised to start lactulose 10 g twice daily.  Advise she continue Zofran 3 times a day as needed.  Advised to continue PPI once daily.  Continue Lasix 40 mg daily as needed.  Continue midodrine, iron, and folic acid supplementation.  It is that we would discuss rescheduling colonoscopy after improvement of nausea and constipation. 2g sodium diet and increased hydration recommended.    Today:  Cirrhosis history Hematemesis/coffee ground emesis: *** History of variceal bleeding: *** Abdominal pain: *** Abdominal distention/worsening ascites: *** Fever/chills: *** Episodes of confusion/disorientation: *** Number of daily bowel movements: *** Taking diuretics?: *** Date of last EGD: *** Prior history of banding?: *** Prior episodes of SBP: *** Last time liver imaging was performed:***  Hepatitis A and B vaccination status: ***  MELD 3.0: 17 at 06/21/2023 11:26 AM MELD-Na: 17 at 06/21/2023 11:26 AM Calculated from: Serum Creatinine: 0.46 mg/dL (Using min of 1 mg/dL) at 03/15/8118 14:78 AM Serum Sodium: 135 mmol/L at 06/21/2023 11:26 AM Total Bilirubin: 3.9 mg/dL at 2/95/6213 08:65 AM Serum Albumin: 3.7 g/dL (Using max of 3.5 g/dL) at 7/84/6962 95:28 AM INR(ratio): 1.3 at 06/21/2023 11:26 AM Age at listing (hypothetical): 41 years Sex: Female at 06/21/2023 11:26 AM    Wt Readings from Last 3 Encounters:  10/05/23 118 lb 6.4 oz (53.7 kg)  08/05/23 128 lb 4.9 oz (58.2 kg)  06/18/23 128  lb 3.2 oz (58.2 kg)    Current Outpatient Medications  Medication Sig Dispense Refill   folic acid (FOLVITE) 1 MG tablet TAKE 1 TABLET BY MOUTH DAILY 30 tablet 1   furosemide (LASIX) 40 MG tablet TAKE 1 TABLET BY MOUTH TWICE DAILY FOR 5 DAYS 30 tablet 2   Iron, Ferrous Sulfate, 325 (65 Fe) MG TABS Take 325 mg by mouth daily. 30 tablet 3   lactulose (CHRONULAC) 10 GM/15ML solution Take 15 mLs (10 g total) by mouth 2 (two) times daily. 946 mL 1   midodrine (PROAMATINE) 2.5 MG tablet TAKE 1 TABLET BY MOUTH THREE TIMES DAILY WITH MEALS .notify provider if FOR BLOOD PRESSURE > 130/70 consisitently 90 tablet 1   ondansetron (ZOFRAN-ODT) 8 MG disintegrating tablet Take 1 tablet (8 mg total) by mouth every 8 (eight) hours as needed for nausea or vomiting. 30 tablet 0   pantoprazole (PROTONIX) 40 MG tablet Take 1 tablet (40 mg total) by mouth daily. 30 tablet 5   POTASSIUM GLUCONATE PO Take 90 mg by mouth daily.     SUBOXONE 8-2 MG FILM Take 1 Film by mouth daily.     No current facility-administered medications for this visit.    Past Medical History:  Diagnosis Date   Abnormal vaginal Pap smear    ASCUS of cervix with negative high risk HPV 09/01/2021   Repeat papin 3 years, per [facility], 5 year risk for CIN 3+ is 0.40%   Depression    Elevated liver enzymes 08/01/2021   Stop alcohol, recheck at appt,  get [place] 9/1   Migraines    Neuropathy    Pregnant 06/19/2015   Sludge in gallbladder 09/01/2021   Refer to Dr Lovell Sheehan   Vaginal Pap smear, abnormal     Past Surgical History:  Procedure Laterality Date   BACK SURGERY     CESAREAN SECTION  x 2   CESAREAN SECTION WITH BILATERAL TUBAL LIGATION Bilateral 02/11/2016   Procedure: REPEAT CESAREAN SECTION WITH BILATERAL TUBAL LIGATION;  Surgeon: Tilda Burrow, MD;  Location: [facility];  Service: Obstetrics;  Laterality: Bilateral;   CHOLECYSTECTOMY N/A 10/22/2021   Procedure: LAPAROSCOPIC CHOLECYSTECTOMY;  Surgeon: Franky Macho, MD;  Location: [facility];  Service: General;  Laterality: N/A;   ESOPHAGOGASTRODUODENOSCOPY (EGD) WITH PROPOFOL N/A 08/11/2023   Procedure: ESOPHAGOGASTRODUODENOSCOPY (EGD) WITH PROPOFOL;  Surgeon: Lanelle Bal, DO;  [facility];  Service: Endoscopy;  Laterality: N/A;   FLEXIBLE SIGMOIDOSCOPY N/A 08/11/2023   Procedure: FLEXIBLE SIGMOIDOSCOPY;  Surgeon: Lanelle Bal, DO;  [facility];  Service: Endoscopy;  Laterality: N/A;  10:00 am, asa 3   TONSILLECTOMY AND ADENOIDECTOMY      Family History  Problem Relation Age of Onset   Dementia Paternal Grandfather    Dementia Paternal Grandmother     Allergies as of 01/04/2024 - Review Complete 10/05/2023  Allergen Reaction Noted   Zomig [zolmitriptan] Shortness Of Breath and Swelling 07/30/2011    Social History   Socioeconomic History   Marital status: Divorced    Spouse name: Not on file   Number of children: Not on file   Years of education: 2 yr coll.   Highest education level: Not on file  Occupational History   Occupation: event planner  Tobacco Use   Smoking status: Every Day    Current packs/day: 1.00    Average packs/day: 1 pack/day for 19.5 years (19.5 ttl pk-yrs)    Types: Cigarettes    Start date: 08/02/2007   Smokeless tobacco: Never   Tobacco comments:    smokes 6 cig per day  Vaping Use   Vaping status: Never Used  Substance and Sexual Activity   Alcohol use: Yes    Comment: occ   Drug use: No    Types: Marijuana   Sexual activity: Not Currently    Birth control/protection: Surgical    Comment: tubal  Other Topics Concern   Not on file  Social History Narrative   Not on file   Social Drivers of Health   Financial Resource Strain: High Risk (08/20/2021)   Overall Financial Resource Strain (CARDIA)    Difficulty of Paying Living Expenses: Very hard  Food Insecurity: No Food Insecurity (05/25/2023)   Hunger Vital Sign    Worried About Running Out of Food in the Last Year: Never true    Ran Out of  Food in the Last Year: Never true  Transportation Needs: No Transportation Needs (05/25/2023)   PRAPARE - Administrator, Civil Service (Medical): No    Lack of Transportation (Non-Medical): No  Physical Activity: Inactive (08/20/2021)   Exercise Vital Sign    Days of Exercise per Week: 0 days    Minutes of Exercise per Session: 0 min  Stress: Stress Concern Present (08/20/2021)   [employer] of Occupational Health - Occupational Stress Questionnaire    Feeling of Stress : Very much  Social Connections: Socially Isolated (08/20/2021)   Social Connection and Isolation Panel [NHANES]    Frequency of Communication with Friends and Family: Three times  a week    Frequency of Social Gatherings with Friends and Family: Never    Attends Religious Services: Never    Database administrator or Organizations: No    Attends Engineer, structural: Never    Marital Status: Divorced     Review of Systems   Gen: Denies fever, chills, anorexia. Denies fatigue, weakness, weight loss.  CV: Denies chest pain, palpitations, syncope, peripheral edema, and claudication. Resp: Denies dyspnea at rest, cough, wheezing, coughing up blood, and pleurisy. GI: See HPI Derm: Denies rash, itching, dry skin Psych: Denies depression, anxiety, memory loss, confusion. No homicidal or suicidal ideation.  Heme: Denies bruising, bleeding, and enlarged lymph nodes.  Physical Exam   LMP 02/13/2021 (Approximate) Comment: LMP WAS IN MARCH  General:   Alert and oriented. No distress noted. Pleasant and cooperative.  Head:  Normocephalic and atraumatic. Eyes:  Conjuctiva clear without scleral icterus. Mouth:  Oral mucosa pink and moist. Good dentition. No lesions. Lungs:  Clear to auscultation bilaterally. No wheezes, rales, or rhonchi. No distress.  Heart:  S1, S2 present without murmurs appreciated.  Abdomen:  +BS, soft, non-tender and non-distended. No rebound or guarding. No HSM or masses  noted. Rectal: *** Msk:  Symmetrical without gross deformities. Normal posture. Extremities:  Without edema. Neurologic:  Alert and  oriented x4 Psych:  Alert and cooperative. Normal mood and affect.  Assessment  Pamela [name] is a 43 y.o. female with a history of *** presenting today with   Cirrhosis:  Constipation:  Alcoholic hepatitis:  Anemia:  PLAN   *** CBC, CMP, INR, TSH? RUQ [place] Continue zofran TID as needed Continue pantoprazole 40 mg daily.  Continue lasix as needed?? Continue midodrine Continue lactulose Continue daily iron Continue folic acid 2g Sodium diet Follow up ***    Brooke Bonito, MSN, FNP-BC, AGACNP-BC [facility]

## 2024-01-04 ENCOUNTER — Encounter: Payer: Self-pay | Admitting: Gastroenterology

## 2024-01-04 ENCOUNTER — Encounter: Payer: Self-pay | Admitting: *Deleted

## 2024-01-04 ENCOUNTER — Ambulatory Visit (INDEPENDENT_AMBULATORY_CARE_PROVIDER_SITE_OTHER): Payer: Medicaid Other | Admitting: Gastroenterology

## 2024-01-04 ENCOUNTER — Ambulatory Visit: Payer: Medicaid Other | Admitting: Gastroenterology

## 2024-01-04 VITALS — BP 125/63 | HR 99 | Temp 97.9°F | Ht 64.0 in | Wt 131.0 lb

## 2024-01-04 DIAGNOSIS — K7030 Alcoholic cirrhosis of liver without ascites: Secondary | ICD-10-CM

## 2024-01-04 DIAGNOSIS — D649 Anemia, unspecified: Secondary | ICD-10-CM

## 2024-01-04 DIAGNOSIS — R110 Nausea: Secondary | ICD-10-CM

## 2024-01-04 DIAGNOSIS — K5903 Drug induced constipation: Secondary | ICD-10-CM

## 2024-01-04 DIAGNOSIS — F1010 Alcohol abuse, uncomplicated: Secondary | ICD-10-CM | POA: Diagnosis not present

## 2024-01-04 DIAGNOSIS — E611 Iron deficiency: Secondary | ICD-10-CM

## 2024-01-04 DIAGNOSIS — K5900 Constipation, unspecified: Secondary | ICD-10-CM

## 2024-01-04 MED ORDER — FUROSEMIDE 20 MG PO TABS: 10.0000 mg | ORAL_TABLET | Freq: Every day | ORAL | 1 refills | Status: DC

## 2024-01-04 MED ORDER — LACTULOSE 10 GM/15ML PO SOLN: 10.0000 g | Freq: Two times a day (BID) | ORAL | 1 refills | Status: DC

## 2024-01-04 MED ORDER — MIDODRINE HCL 2.5 MG PO TABS: 2.5000 mg | ORAL_TABLET | Freq: Three times a day (TID) | ORAL | 1 refills | Status: DC

## 2024-01-04 MED ORDER — PANTOPRAZOLE SODIUM 40 MG PO TBEC: 40.0000 mg | DELAYED_RELEASE_TABLET | Freq: Every day | ORAL | 5 refills | Status: DC

## 2024-01-04 MED ORDER — ONDANSETRON 8 MG PO TBDP: 8.0000 mg | ORAL_TABLET | Freq: Three times a day (TID) | ORAL | 0 refills | Status: DC | PRN

## 2024-01-04 MED ORDER — FOLIC ACID 1 MG PO TABS: 1.0000 mg | ORAL_TABLET | Freq: Every day | ORAL | 1 refills | Status: DC

## 2024-01-04 NOTE — Patient Instructions (Signed)
Please have blood work completed at [facility].  We will call you with results once they have been received. Please allow 3-5 business days for review. 2 locations for [facility] in [place]:              1. [address], [place]              2. [address] [place]    We will get you scheduled for an ultrasound of your liver.  I have sent in a refill of your medications for you to [facility] drug.  For your Lasix I sent in 20 mg tablets, which you to take a half a tablet once daily, if you are having low blood pressures or excessive thirst please let me know and decrease your Lasix use.  Please start taking MiraLAX 17 g on workdays. Take your lactulose on days you are off. I suspect that constipation is the cause of the feeling of hardness on your abdomen given things improve after you have a bowel movement.  Cirrhosis Lifestyle Recommendations:  High-protein diet from a primarily plant-based diet. Avoid red meat.  No raw or undercooked meat, seafood, or shellfish. Low-fat/cholesterol/carbohydrate diet. Limit sodium to no more than 2000 mg/day including everything that you eat and drink. Recommend at least 30 minutes of aerobic and resistance exercise 3 days/week. Limit Tylenol to 2000 mg daily.   Follow-up in 6 months, sooner if needed.  It was a pleasure to see you today. I want to create trusting relationships with patients. If you receive a survey regarding your visit,  I greatly appreciate you taking time to fill this out on paper or through your MyChart. I value your feedback.  Brooke Bonito, MSN, FNP-BC, AGACNP-BC [facility]

## 2024-01-04 NOTE — Progress Notes (Signed)
GI Office Note    Referring Provider: Babs Sciara, MD Primary Care Physician:  Babs Sciara, MD Primary Gastroenterologist: Hennie Duos. Marletta Lor, DO  Date:  01/04/2024  ID:  Waldon Merl, DOB Aug 13, 1981, MRN [id number]   Chief Complaint   Chief Complaint  Patient presents with   Follow-up    Follow up on blood work, refills and alcoholic hepatitis    History of Present Illness  Kimiyah Blick is a 43 y.o. female with a history of depression, anemia, alcohol abuse and alcoholic cirrhosis presenting today for cirrhosis follow up and medication refills.   Seen during hospitalization in June 6/18-6/22.  She presented to the hospital with abdominal pain, nausea, vomiting, and jaundice.  Also with abdominal swelling.  Her presentation was acute alcoholic hepatitis in the setting of anasarca. Labs were checked regularly and she was started on prednisolone for alcoholic hepatitis.  PPI daily.  Recommended complete alcohol cessation.  Start iron therapy.  Plan for outpatient EGD and colonoscopy.  Lille score 0.627 therefore she was advised to be discharged without Prednisolone.  She is advised to have CBC, CMP, INR in 1 week and close follow-up in 1-2 weeks.  She was also advised that if sodium improves could consider low-dose furosemide and spironolactone.  Advised to consider lactulose if she presents any recurrent forgetfulness or changes in mental status.   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    CT liver/abdomen 05/26/23 IMPRESSION: 1. Stable appearing cirrhotic changes involving the liver with portal venous hypertension, HSM, extensive portal venous collaterals and splenorenal shunt. 2. No arterial phase enhancing lesions to suggest dysplastic nodules or HCC. 3. Small volume abdominal and upper pelvic ascites. 4. Colonic wall thickening and submucosal edema could suggest colitis or may be related to low albumin and mild  ascites. Recommend correlation with any colonic symptoms such as diarrhea   Minimal ascites on [place] 05/28/23   ED visit 06/12/23 for edema. Lasix was increased to BID for 5 days and advised recheck of electrolytes and follow with fluid restriction. Labs 06/12/23: WBC 12, hemoglobin 11.3, platelets 240, sodium 131, creatinine 0.43 33, alk phos 123, T. bili 7.1, lipase 37, ethanol <10.  Blood cultures negative.  INR 1.4   Last office visit 06/17/2023. Abdominal distention improving.  Having 1-2 bowel movements per day.  Taking Lasix twice daily short-term, not currently on Aldactone.  With still having some peripheral edema.  Also recently treated for cellulitis.  Drinking large amounts of water daily previously.  History of cholecystectomy.  Taking iron.  Feeling constantly hungry. Scheduled for EGD and colonoscopy.  Autoimmune serologies ordered.  Advised 2 L fluid restriction and sodium restricted diet.  Ongoing alcohol cessation recommended.  Advised to resume Aldactone and continue Lasix 40 daily.   Labs 06/21/2023: Elevated ANA, IgA, and IgG negative ANA, AMA, ASMA, antiliver kidney antibody.  AFP and ceruloplasmin within normal limits   EGD 08/11/23: - Non- obstructing Schatzki ring.  - Portal hypertensive gastropathy.  - Normal duodenum.  - No specimens collected.   Flexible sigmoidoscopy 08/11/23:  - Preparation of the colon was inadequate.  - Stool in the sigmoid colon and in the descending colon.  - No specimens collected. - Advised repeat colonoscopy in 3 months.  - Reportedly vomited up majority of her prep.  Last office visit 10/05/23.  Advised to check CBC, CMP, TSH.  Plan for RUQ [place] in December and MELD labs in January.  Advised to  start lactulose 10 g twice daily.  Advise she continue Zofran 3 times a day as needed.  Advised to continue PPI once daily.  Continue Lasix 40 mg daily as needed.  Continue midodrine, iron, and folic acid supplementation.  It is that we would discuss rescheduling  colonoscopy after improvement of nausea and constipation. 2g sodium diet and increased hydration recommended.     Today:   Cirrhosis history Hematemesis/coffee ground emesis: none History of variceal bleeding: No Abdominal pain: none Abdominal distention/worsening ascites: none Fever/chills: No Episodes of confusion/disorientation: none Number of daily bowel movements: having 1 BM daily - not taking the Lactulose daily. She is taking it on days off.  Taking diuretics?: lasix as needed Date of last EGD: Sept 2024 Prior history of banding?: no Prior episodes of SBP: no Last time liver imaging was performed: June 2024 -likely benign hepatic cysts   MELD 3.0: 17 at 06/21/2023 11:26 AM MELD-Na: 17 at 06/21/2023 11:26 AM Calculated from: Serum Creatinine: 0.46 mg/dL (Using min of 1 mg/dL) at 0/98/1191 47:82 AM Serum Sodium: 135 mmol/L at 06/21/2023 11:26 AM Total Bilirubin: 3.9 mg/dL at 9/56/2130 86:57 AM Serum Albumin: 3.7 g/dL (Using max of 3.5 g/dL) at 8/46/9629 52:84 AM INR(ratio): 1.3 at 06/21/2023 11:26 AM Age at listing (hypothetical): 41 years Sex: Female at 06/21/2023 11:26 AM  She has had some teeth that are broken and taking amoxicillin. Going to have them all pulled soon and will have dentures placed. Pain for her teeth for the last few weeks. Not able to eat like normal and drinking ensure (2 per day usually). Drinks one at night no matter what.   Having dry skin and some pruritis to her left leg. No more numbness to her fingers. Still having some hair loss.   Tries to take her midodrine three times a day.   Has been trying to wean off suboxone.    Wt Readings from Last 3 Encounters:  01/04/24 131 lb (59.4 kg)  10/05/23 118 lb 6.4 oz (53.7 kg)  08/05/23 128 lb 4.9 oz (58.2 kg)    Current Outpatient Medications  Medication Sig Dispense Refill   penicillin v potassium (VEETID) 500 MG tablet Take 500 mg by mouth 4 (four) times daily.     folic acid (FOLVITE) 1 MG tablet  TAKE 1 TABLET BY MOUTH DAILY 30 tablet 1   furosemide (LASIX) 40 MG tablet TAKE 1 TABLET BY MOUTH TWICE DAILY FOR 5 DAYS 30 tablet 2   Iron, Ferrous Sulfate, 325 (65 Fe) MG TABS Take 325 mg by mouth daily. 30 tablet 3   lactulose (CHRONULAC) 10 GM/15ML solution Take 15 mLs (10 g total) by mouth 2 (two) times daily. 946 mL 1   midodrine (PROAMATINE) 2.5 MG tablet TAKE 1 TABLET BY MOUTH THREE TIMES DAILY WITH MEALS .notify provider if FOR BLOOD PRESSURE > 130/70 consisitently 90 tablet 1   ondansetron (ZOFRAN-ODT) 8 MG disintegrating tablet Take 1 tablet (8 mg total) by mouth every 8 (eight) hours as needed for nausea or vomiting. 30 tablet 0   pantoprazole (PROTONIX) 40 MG tablet Take 1 tablet (40 mg total) by mouth daily. 30 tablet 5   POTASSIUM GLUCONATE PO Take 90 mg by mouth daily.     SUBOXONE 8-2 MG FILM Take 1 Film by mouth daily.     No current facility-administered medications for this visit.    Past Medical History:  Diagnosis Date   Abnormal vaginal Pap smear    ASCUS of cervix with negative  high risk HPV 09/01/2021   Repeat papin 3 years, per [facility], 5 year risk for CIN 3+ is 0.40%   Depression    Elevated liver enzymes 08/01/2021   Stop alcohol, recheck at appt, get [place] 9/1   Migraines    Neuropathy    Pregnant 06/19/2015   Sludge in gallbladder 09/01/2021   Refer to Dr Lovell Sheehan   Vaginal Pap smear, abnormal     Past Surgical History:  Procedure Laterality Date   BACK SURGERY     CESAREAN SECTION  x 2   CESAREAN SECTION WITH BILATERAL TUBAL LIGATION Bilateral 02/11/2016   Procedure: REPEAT CESAREAN SECTION WITH BILATERAL TUBAL LIGATION;  Surgeon: Tilda Burrow, MD;  Location: [facility];  Service: Obstetrics;  Laterality: Bilateral;   CHOLECYSTECTOMY N/A 10/22/2021   Procedure: LAPAROSCOPIC CHOLECYSTECTOMY;  Surgeon: Franky Macho, MD;  Location: [facility];  Service: General;  Laterality: N/A;   ESOPHAGOGASTRODUODENOSCOPY (EGD) WITH PROPOFOL N/A 08/11/2023   Procedure:  ESOPHAGOGASTRODUODENOSCOPY (EGD) WITH PROPOFOL;  Surgeon: Lanelle Bal, DO;  Location: [facility];  Service: Endoscopy;  Laterality: N/A;   FLEXIBLE SIGMOIDOSCOPY N/A 08/11/2023   Procedure: FLEXIBLE SIGMOIDOSCOPY;  Surgeon: Lanelle Bal, DO;  Location: [facility];  Service: Endoscopy;  Laterality: N/A;  10:00 am, asa 3   TONSILLECTOMY AND ADENOIDECTOMY      Family History  Problem Relation Age of Onset   Dementia Paternal Grandfather    Dementia Paternal Grandmother     Allergies as of 01/04/2024 - Review Complete 01/04/2024  Allergen Reaction Noted   Zomig [zolmitriptan] Shortness Of Breath and Swelling 07/30/2011    Social History   Socioeconomic History   Marital status: Divorced    Spouse name: Not on file   Number of children: Not on file   Years of education: 2 yr coll.   Highest education level: Not on file  Occupational History   Occupation: event planner  Tobacco Use   Smoking status: Every Day    Current packs/day: 1.00    Average packs/day: 1 pack/day for 19.5 years (19.5 ttl pk-yrs)    Types: Cigarettes    Start date: 08/02/2007   Smokeless tobacco: Never   Tobacco comments:    smokes 6 cig per day  Vaping Use   Vaping status: Never Used  Substance and Sexual Activity   Alcohol use: Yes    Comment: occ   Drug use: No    Types: Marijuana   Sexual activity: Not Currently    Birth control/protection: Surgical    Comment: tubal  Other Topics Concern   Not on file  Social History Narrative   Not on file   Social Drivers of Health   Financial Resource Strain: High Risk (08/20/2021)   Overall Financial Resource Strain (CARDIA)    Difficulty of Paying Living Expenses: Very hard  Food Insecurity: No Food Insecurity (05/25/2023)   Hunger Vital Sign    Worried About Running Out of Food in the Last Year: Never true    Ran Out of Food in the Last Year: Never true  Transportation Needs: No Transportation Needs (05/25/2023)   PRAPARE -  Administrator, Civil Service (Medical): No    Lack of Transportation (Non-Medical): No  Physical Activity: Inactive (08/20/2021)   Exercise Vital Sign    Days of Exercise per Week: 0 days    Minutes of Exercise per Session: 0 min  Stress: Stress Concern Present (08/20/2021)   [employer] of Occupational Health - Occupational  Stress Questionnaire    Feeling of Stress : Very much  Social Connections: Socially Isolated (08/20/2021)   Social Connection and Isolation Panel [NHANES]    Frequency of Communication with Friends and Family: Three times a week    Frequency of Social Gatherings with Friends and Family: Never    Attends Religious Services: Never    Database administrator or Organizations: No    Attends Engineer, structural: Never    Marital Status: Divorced     Review of Systems   Gen: Denies fever, chills, anorexia. Denies fatigue, weakness, weight loss.  CV: Denies chest pain, palpitations, syncope, peripheral edema, and claudication. Resp: Denies dyspnea at rest, cough, wheezing, coughing up blood, and pleurisy. GI: See HPI Derm: Denies rash, itching, dry skin Psych: Denies depression, anxiety, memory loss, confusion. No homicidal or suicidal ideation.  Heme: Denies bruising, bleeding, and enlarged lymph nodes.  Physical Exam   BP 125/63   Pulse 99   Temp 97.9 F (36.6 C)   Ht 5\' 4"  (1.626 m)   Wt 131 lb (59.4 kg)   LMP 02/13/2021 (Approximate) Comment: LMP WAS IN MARCH  BMI 22.49 kg/m   General:   Alert and oriented. No distress noted. Pleasant and cooperative.  Head:  Normocephalic and atraumatic. Eyes:  Conjuctiva clear without scleral icterus. Mouth:  Oral mucosa pink and moist. Good dentition. No lesions. Abdomen:  +BS, soft, non-tender and non-distended. No rebound or guarding. No HSM or masses noted. Rectal: deferred Msk:  Symmetrical without gross deformities. Normal posture. Extremities:  Without edema. Neurologic:  Alert and   oriented x4 Psych:  Alert and cooperative. Normal mood and affect.  Assessment  Osceola Holian is a 43 y.o. female with a history of depression, anemia, alcohol abuse and alcoholic cirrhosis presenting today for cirrhosis follow up and medication refills.   Alcoholic cirrhosis: Due for updated labs, labs ordered in October were not retrieved.  Cirrhosis etiology likely secondary to alcohol use, but could have a component of MASH.  Autoimmune workup has been negative.  EGD September 2024 without evidence of varices but does have portal hypertensive gastropathy.  Previously had significant ascites and peripheral edema.  Had been taking Lasix only as needed.  Currently having some mild intermittent peripheral edema and some pruritus to her left lower extremity with the swelling.  Encouraged Lasix 10 mg daily.  No signs of PEG encephalopathy or asterixis today.  Encouraged more use of lactulose and better bowel frequency to avoid buildup of ammonia and hepatic encephalopathy.  She has been avoiding alcohol and following a low-sodium diet.  She also continues on midodrine which is controlling her blood pressure.  Currently due for hepatoma screening, will order RUQ [place].   Constipation: Currently still struggling with constipation, will have good bowel movements after she takes lactulose on her days off of work.  Does not like to take on days of work given her inability to get to the bathroom in an adequate timeframe.  Encouraged use of MiraLAX on her days off and lactulose when she is at home.  Her complaint of some intermittent abdominal distention/hardening is improved with defecation.   Anemia: History of low iron saturation on iron studies.  Previous labs with stable hemoglobin in the 11 range.  EGD and colonoscopy as outlined in HPI.  Advised her to continue taking daily iron supplementation, will recheck CBC today.  Given concerns for hair loss will also check TSH.  PLAN   CBC, CMP, INR, TSH  RUQ  [place] Continue zofran TID as needed Continue pantoprazole 40 mg daily.  Continue lasix, recommended 10 mg daily as needed.  If signs of dehydration advised to let me know and then use Lasix as needed. Continue midodrine 2.5 mg TID Continue lactulose 10 g twice daily while at home. Start MiraLAX 17 g once daily on workdays. Continue daily iron Continue folic acid daily 2g Sodium diet Revisit colonoscopy at next visit. Follow up 6 months.    Brooke Bonito, MSN, FNP-BC, AGACNP-BC [facility]

## 2024-01-10 ENCOUNTER — Ambulatory Visit (HOSPITAL_COMMUNITY)
Admission: RE | Admit: 2024-01-10 | Discharge: 2024-01-10 | Disposition: A | Discharge status: Home / Self Care | Payer: Medicaid Other | Source: Ambulatory Visit | Attending: Gastroenterology | Admitting: Gastroenterology

## 2024-01-10 DIAGNOSIS — K7030 Alcoholic cirrhosis of liver without ascites: Secondary | ICD-10-CM | POA: Insufficient documentation

## 2024-01-26 ENCOUNTER — Other Ambulatory Visit: Payer: Self-pay | Admitting: Gastroenterology

## 2024-02-15 ENCOUNTER — Other Ambulatory Visit: Payer: Self-pay | Admitting: Gastroenterology

## 2024-02-17 ENCOUNTER — Other Ambulatory Visit: Payer: Self-pay | Admitting: Gastroenterology

## 2024-02-25 ENCOUNTER — Other Ambulatory Visit: Payer: Self-pay | Admitting: Gastroenterology

## 2024-04-13 ENCOUNTER — Other Ambulatory Visit: Payer: Self-pay | Admitting: Gastroenterology

## 2024-07-03 ENCOUNTER — Encounter: Payer: Self-pay | Admitting: Gastroenterology

## 2024-07-03 ENCOUNTER — Ambulatory Visit (INDEPENDENT_AMBULATORY_CARE_PROVIDER_SITE_OTHER): Admitting: Gastroenterology

## 2024-07-03 VITALS — BP 107/56 | HR 77 | Temp 98.2°F | Ht 64.0 in | Wt 138.1 lb

## 2024-07-03 DIAGNOSIS — D539 Nutritional anemia, unspecified: Secondary | ICD-10-CM

## 2024-07-03 DIAGNOSIS — K5900 Constipation, unspecified: Secondary | ICD-10-CM | POA: Diagnosis not present

## 2024-07-03 DIAGNOSIS — D649 Anemia, unspecified: Secondary | ICD-10-CM

## 2024-07-03 DIAGNOSIS — F1010 Alcohol abuse, uncomplicated: Secondary | ICD-10-CM

## 2024-07-03 DIAGNOSIS — K7030 Alcoholic cirrhosis of liver without ascites: Secondary | ICD-10-CM | POA: Diagnosis not present

## 2024-07-03 DIAGNOSIS — E611 Iron deficiency: Secondary | ICD-10-CM

## 2024-07-03 MED ORDER — FOLIC ACID 1 MG PO TABS: 1.0000 mg | ORAL_TABLET | Freq: Every day | ORAL | 1 refills | Status: AC

## 2024-07-03 NOTE — H&P (View-Only) (Signed)
 GI Office Note    Referring Provider: Alphonsa Glendia LABOR, MD Primary Care Physician:  Alphonsa Glendia LABOR, MD Primary Gastroenterologist: Carlin POUR. Cindie, DO  Date:  07/03/2024  ID:  Pamela [name], DOB 06/17/1981, MRN [id number]  Chief Complaint   Chief Complaint  Patient presents with   Follow-up    Pt arrives for follow up. Will need refills on medications. No issues at this time.    History of Present Illness  Pamela [name] is a 43 y.o. female with a history of anemia, depression, and cirrhosis secondary to alcohol use presenting today for cirrhosis follow-up and medication refill.  Hospitalization in June 6/18-6/22/2024  She presented to the hospital with abdominal pain, nausea, vomiting, and jaundice.  Also with abdominal swelling.  Her presentation was acute alcoholic hepatitis in the setting of anasarca. Labs were checked regularly and she was started on prednisolone  for alcoholic hepatitis.  PPI daily.  Recommended complete alcohol cessation.  Start iron  therapy.  Plan for outpatient EGD and colonoscopy.  Lille score 0.627 therefore she was advised to be discharged without Prednisolone .  She is advised to have CBC, CMP, INR in 1 week and close follow-up in 1-2 weeks.  She was also advised that if sodium improves could consider low-dose furosemide  and spironolactone .  Advised to consider lactulose  if she presents any recurrent forgetfulness or changes in mental status.   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    CT liver/abdomen 05/26/23 IMPRESSION: 1. Stable appearing cirrhotic changes involving the liver with portal venous hypertension, HSM, extensive portal venous collaterals and splenorenal shunt. 2. No arterial phase enhancing lesions to suggest dysplastic nodules or HCC. 3. Small volume abdominal and upper pelvic ascites. 4. Colonic wall thickening and submucosal edema could suggest colitis or may be related to low  albumin  and mild ascites. Recommend correlation with any colonic symptoms such as diarrhea   Minimal ascites on US  05/28/23   ED visit 06/12/23 for edema. Lasix  was increased to BID for 5 days and advised recheck of electrolytes and follow with fluid restriction. Labs 06/12/23: WBC 12, hemoglobin 11.3, platelets 240, sodium 131, creatinine 0.43 33, alk phos 123, T. bili 7.1, lipase 37, ethanol <10.  Blood cultures negative.  INR 1.4   Last office visit 06/17/2023. Abdominal distention improving.  Having 1-2 bowel movements per day.  Taking Lasix  twice daily short-term, not currently on Aldactone .  With still having some peripheral edema.  Also recently treated for cellulitis.  Drinking large amounts of water daily previously.  History of cholecystectomy.  Taking iron .  Feeling constantly hungry. Scheduled for EGD and colonoscopy.  Autoimmune serologies ordered.  Advised 2 L fluid restriction and sodium restricted diet.  Ongoing alcohol cessation recommended.  Advised to resume Aldactone  and continue Lasix  40 daily.   Labs 06/21/2023: Elevated ANA, IgA, and IgG negative ANA, AMA, ASMA, antiliver kidney antibody.  AFP and ceruloplasmin within normal limits   EGD 08/11/23: - Non- obstructing Schatzki ring.  - Portal hypertensive gastropathy.  - Normal duodenum.  - No specimens collected.   Flexible sigmoidoscopy 08/11/23:  - Preparation of the colon was inadequate.  - Stool in the sigmoid colon and in the descending colon.  - No specimens collected. - Advised repeat colonoscopy in 3 months.  - Reportedly vomited up majority of her prep.   OV 10/05/23.  Advised to check CBC, CMP, TSH.  Plan for RUQ US  in December and MELD labs in January.  Advised  to start lactulose  10 g twice daily.  Advise she continue Zofran  3 times a day as needed.  Advised to continue PPI once daily.  Continue Lasix  40 mg daily as needed.  Continue midodrine , iron , and folic acid  supplementation.  It is that we would discuss  rescheduling colonoscopy after improvement of nausea and constipation. 2g sodium diet and increased hydration recommended.   Last office visit 01/04/24.  Denied any emesis, abdominal pain, ascites.  Taking Lasix  as needed.  No evidence of ascites or peripheral edema.  Having about 1 bowel movement daily and not taking lactulose  unless she is at home when not at work.  Recently had some dental work done and was taking amoxicillin .  Was not able to eat or drink normally and had been drinking ensures given decreased p.o. intake.  Did note some pruritus and dry skin and still having issues with hair loss.  Taking midodrine  3 times a day and working on weaning off Suboxone .  Advise labs, ultrasound.  Continue Lasix  as needed.  Continue PPI daily and Zofran  as needed.  Continue midodrine  3 times a day and lactulose  twice daily while at home as well as MiraLAX once daily on workdays.  2 g sodium diet continue folic acid  supplementation.  Plans to revisit colonoscopy at next visit.   Today:  Cirrhosis history Hematemesis/coffee ground emesis: None History of variceal bleeding: No Abdominal pain: None Abdominal distention/worsening ascites: None Fever/chills: None Episodes of confusion/disorientation: None Number of daily bowel movements: 1-2.  Not needing lactulose . Taking diuretics?:  Lasix  as needed Date of last EGD: September 2024 with portal hypertensive gastropathy, no varices. Prior history of banding?:  No Prior episodes of SBP: None Last time liver imaging was performed: 01/10/2024 -cirrhosis with no evidence of liver lesion or mass.  CBD 1.1 cm without choledocholithiasis.   MELD score: Needs to be updated.  Previously 17 in July of last year.  Computed MELD 3.0 unavailable. One or more values for this score either were not found within the given timeframe or did not fit some other criterion. Computed MELD-Na unavailable. One or more values for this score either were not found within the given  timeframe or did not fit some other criterion.  Does not feel as though she needs pantoprazole . Had been having trouble sleeping  Tried to get off suboxone  and requested to come off of it and had gotten to where she was doing tramadol  as needed and then had to be released from ortho to PT and then his prescription for tramadol  got booted and then went back to suboxone  but within 2 weeks she was having withdrawal sickness.  No issues with constipation. Has urgency. Not tacking anythig currently. Stil has plenty of lactulose  just in case.   Wt Readings from Last 5 Encounters:  01/04/24 131 lb (59.4 kg)  10/05/23 118 lb 6.4 oz (53.7 kg)  08/05/23 128 lb 4.9 oz (58.2 kg)  06/18/23 128 lb 3.2 oz (58.2 kg)  06/17/23 130 lb (59 kg)    Current Outpatient Medications  Medication Sig Dispense Refill   folic acid  (FOLVITE ) 1 MG tablet TAKE 1 TABLET BY MOUTH DAILY 30 tablet 1   furosemide  (LASIX ) 20 MG tablet Take 0.5 tablets (10 mg total) by mouth daily. (Patient taking differently: Take 10 mg by mouth as needed.) 30 tablet 1   Iron , Ferrous Sulfate , 325 (65 Fe) MG TABS Take 325 mg by mouth daily. 30 tablet 3   midodrine  (PROAMATINE ) 2.5 MG tablet TAKE 1  TABLET BY MOUTH THREE TIMES DAILY WITH MEALS 90 tablet 1   ondansetron  (ZOFRAN -ODT) 8 MG disintegrating tablet TAKE 1 TABLET BY MOUTH EVERY 8 HOURS AS NEEDED FOR NAUSEA AND VOMITING 30 tablet 0   penicillin  v potassium (VEETID) 500 MG tablet Take 500 mg by mouth 4 (four) times daily.     POTASSIUM GLUCONATE PO Take 90 mg by mouth daily.     SUBOXONE  8-2 MG FILM Take 1 Film by mouth daily. (Patient taking differently: Take 1 Film by mouth daily.)     No current facility-administered medications for this visit.    Past Medical History:  Diagnosis Date   Abnormal vaginal Pap smear    ASCUS of cervix with negative high risk HPV 09/01/2021   Repeat papin 3 years, per [facility], 5 year risk for CIN 3+ is 0.40%   Depression    Elevated liver enzymes  08/01/2021   Stop alcohol, recheck at appt, get US  9/1   Migraines    Neuropathy    Pregnant 06/19/2015   Sludge in gallbladder 09/01/2021   Refer to Dr Mavis   Vaginal Pap smear, abnormal     Past Surgical History:  Procedure Laterality Date   BACK SURGERY     CESAREAN SECTION  x 2   CESAREAN SECTION WITH BILATERAL TUBAL LIGATION Bilateral 02/11/2016   Procedure: REPEAT CESAREAN SECTION WITH BILATERAL TUBAL LIGATION;  Surgeon: Norleen Edsel GAILS, MD;  Location: [facility];  Service: Obstetrics;  Laterality: Bilateral;   CHOLECYSTECTOMY N/A 10/22/2021   Procedure: LAPAROSCOPIC CHOLECYSTECTOMY;  Surgeon: Mavis Anes, MD;  Location: [facility];  Service: General;  Laterality: N/A;   ESOPHAGOGASTRODUODENOSCOPY (EGD) WITH PROPOFOL  N/A 08/11/2023   Procedure: ESOPHAGOGASTRODUODENOSCOPY (EGD) WITH PROPOFOL ;  Surgeon: Cindie Carlin POUR, DO;  Location: [facility];  Service: Endoscopy;  Laterality: N/A;   FLEXIBLE SIGMOIDOSCOPY N/A 08/11/2023   Procedure: FLEXIBLE SIGMOIDOSCOPY;  Surgeon: Cindie Carlin POUR, DO;  Location: [facility];  Service: Endoscopy;  Laterality: N/A;  10:00 am, asa 3   TONSILLECTOMY AND ADENOIDECTOMY      Family History  Problem Relation Age of Onset   Dementia Paternal Grandfather    Dementia Paternal Grandmother     Allergies as of 07/03/2024 - Review Complete 07/03/2024  Allergen Reaction Noted   Zomig [zolmitriptan] Shortness Of Breath and Swelling 07/30/2011    Social History   Socioeconomic History   Marital status: Divorced    Spouse name: Not on file   Number of children: Not on file   Years of education: 2 yr coll.   Highest education level: Not on file  Occupational History   Occupation: event planner  Tobacco Use   Smoking status: Every Day    Current packs/day: 1.00    Average packs/day: 1 pack/day for 20.0 years (20.0 ttl pk-yrs)    Types: Cigarettes    Start date: 08/02/2007   Smokeless tobacco: Never   Tobacco comments:    smokes 6 cig per day   Vaping Use   Vaping status: Never Used  Substance and Sexual Activity   Alcohol use: Yes    Comment: occ   Drug use: No    Types: Marijuana   Sexual activity: Not Currently    Birth control/protection: Surgical    Comment: tubal  Other Topics Concern   Not on file  Social History Narrative   Not on file   Social Drivers of Health   Financial Resource Strain: High Risk (08/20/2021)   Overall Financial Resource Strain (  CARDIA)    Difficulty of Paying Living Expenses: Very hard  Food Insecurity: No Food Insecurity (05/25/2023)   Hunger Vital Sign    Worried About Running Out of Food in the Last Year: Never true    Ran Out of Food in the Last Year: Never true  Transportation Needs: No Transportation Needs (05/25/2023)   PRAPARE - Administrator, Civil Service (Medical): No    Lack of Transportation (Non-Medical): No  Physical Activity: Inactive (08/20/2021)   Exercise Vital Sign    Days of Exercise per Week: 0 days    Minutes of Exercise per Session: 0 min  Stress: Stress Concern Present (08/20/2021)   [employer] of Occupational Health - Occupational Stress Questionnaire    Feeling of Stress : Very much  Social Connections: Socially Isolated (08/20/2021)   Social Connection and Isolation Panel    Frequency of Communication with Friends and Family: Three times a week    Frequency of Social Gatherings with Friends and Family: Never    Attends Religious Services: Never    Database administrator or Organizations: No    Attends Engineer, structural: Never    Marital Status: Divorced     Review of Systems   Gen: Denies fever, chills, anorexia. Denies fatigue, weakness, weight loss.  CV: Denies chest pain, palpitations, syncope, peripheral edema, and claudication. Resp: Denies dyspnea at rest, cough, wheezing, coughing up blood, and pleurisy. GI: See HPI Derm: Denies rash, itching, dry skin Psych: Denies depression, anxiety, memory loss, confusion. No  homicidal or suicidal ideation.  Heme: Denies bruising, bleeding, and enlarged lymph nodes.  Physical Exam   LMP 02/13/2021 (Approximate) Comment: LMP WAS IN MARCH  General:   Alert and oriented. No distress noted. Pleasant and cooperative.  Head:  Normocephalic and atraumatic. Eyes:  Conjuctiva clear without scleral icterus. Abdomen:  +BS, soft, non-tender and non-distended. No rebound or guarding. No HSM or masses noted. Rectal: deferred Msk:  Symmetrical without gross deformities. Normal posture. Extremities: Nonpitting edema bilaterally Neurologic:  Alert and  oriented x4.  No asterixis. Psych:  Alert and cooperative. Normal mood and affect.  Assessment  Pamela [name] is a 43 y.o. female presenting today for cirrhosis follow up.   Cirrhosis: - Secondary to prior alcohol abuse after the loss of her daughter.  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 - Gastropathy on EGD September 2024 but no varices. - No significant ascites, mild peripheral edema but not taking Lasix .  Previously taking as needed - No signs of hepatic encephalopathy - Ultrasound in February with no evidence of hepatoma, due for ultrasound next month - Prior MELD score 17 in July of last year, no recent updated labs.  Did not complete labs in January as previously ordered.  In need of updating currently, encouraged her to complete day of ultrasound - Advised to take lactulose  if signs/symptoms of confusion and/or constipation. - Will continue folic acid  (refilled today )and iron  supplementation as well as midodrine   Constipation: Resume lactulose  and/or MiraLAX as needed.  Currently having 1-2 bowel movements daily.  Anemia: History of iron  deficiency.  Last labs with stable hemoglobin, due for recheck.  Prior attempted colonoscopy unsuccessful given poor prep.  EGD with hypertensive gastropathy with no varices.  Has occasional fatigue.  Given reports of hair loss we will also check TSH and  update all labs.  Given poor prep, she is eager to repeat colonoscopy now given she is out of work given prior  knee/leg fracture about 3 and half months ago.  PLAN   Proceed with colonoscopy with propofol  by Dr. Cindie  in near future: the risks, benefits, and alternatives have been discussed with the patient in detail. The patient states understanding and desires to proceed. ASA 3 (ok room 1/2)  Zofran  as needed for nausea  Lactulose  10 g daily for 4 days prior to prep Trilyte split prep CBC, CMP, INR, AFP, iron  panel RUQ US  next month 2 g sodium diet Continue Lasix  20 mg as needed Continue iron  and folic acid  Continue midodrine  2.5 mg 3 times daily Continue lactulose  as needed.  Continue Zofran  as needed Follow up 6 months.     Pamela Melia, MSN, FNP-BC, AGACNP-BC [facility]

## 2024-07-03 NOTE — Patient Instructions (Addendum)
 We will get you scheduled for right upper quadrant ultrasound in the near future.  Please have blood work completed at [facility] or at [facility] when you present for your ultrasound..  We will call you with results once they have been received. Please allow 3-5 business days for review. 2 locations for [facility] in [place]:              1. [address], [place]              2. [address] [place]   We will also attempt to repeat your colonoscopy.  Please keep the Zofran  at home as you can use this as needed if you do have any nausea related to your prep.  To make sure you do not have any severe constipation I would recommend taking lactulose  at least 1 dose daily for 4 days prior to beginning your prep.  I have refilled your folic acid  today.  Please continue taking iron  once daily as well.  Keep your Lasix  and lactulose  to use as needed.  Please try to keep your legs elevated is much as possible.  If you start showing any pitting edema in your lower extremities I want you to start back your Lasix .  Continue taking the midodrine  3 times daily.  Follow-up in 6 months, sooner if needed.  It was a pleasure to see you today. I want to create trusting relationships with patients. If you receive a survey regarding your visit,  I greatly appreciate you taking time to fill this out on paper or through your MyChart. I value your feedback.  Charmaine Melia, MSN, FNP-BC, AGACNP-BC [facility]

## 2024-07-03 NOTE — Progress Notes (Signed)
 GI Office Note    Referring Provider: Alphonsa Glendia LABOR, MD Primary Care Physician:  Alphonsa Glendia LABOR, MD Primary Gastroenterologist: Carlin POUR. Cindie, DO  Date:  07/03/2024  ID:  Nanetta Mention, DOB 06/17/1981, MRN [id number]  Chief Complaint   Chief Complaint  Patient presents with   Follow-up    Pt arrives for follow up. Will need refills on medications. No issues at this time.    History of Present Illness  Earnesteen Birnie is a 43 y.o. female with a history of anemia, depression, and cirrhosis secondary to alcohol use presenting today for cirrhosis follow-up and medication refill.  Hospitalization in June 6/18-6/22/2024  She presented to the hospital with abdominal pain, nausea, vomiting, and jaundice.  Also with abdominal swelling.  Her presentation was acute alcoholic hepatitis in the setting of anasarca. Labs were checked regularly and she was started on prednisolone  for alcoholic hepatitis.  PPI daily.  Recommended complete alcohol cessation.  Start iron  therapy.  Plan for outpatient EGD and colonoscopy.  Lille score 0.627 therefore she was advised to be discharged without Prednisolone .  She is advised to have CBC, CMP, INR in 1 week and close follow-up in 1-2 weeks.  She was also advised that if sodium improves could consider low-dose furosemide  and spironolactone .  Advised to consider lactulose  if she presents any recurrent forgetfulness or changes in mental status.   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    CT liver/abdomen 05/26/23 IMPRESSION: 1. Stable appearing cirrhotic changes involving the liver with portal venous hypertension, HSM, extensive portal venous collaterals and splenorenal shunt. 2. No arterial phase enhancing lesions to suggest dysplastic nodules or HCC. 3. Small volume abdominal and upper pelvic ascites. 4. Colonic wall thickening and submucosal edema could suggest colitis or may be related to low  albumin  and mild ascites. Recommend correlation with any colonic symptoms such as diarrhea   Minimal ascites on US  05/28/23   ED visit 06/12/23 for edema. Lasix  was increased to BID for 5 days and advised recheck of electrolytes and follow with fluid restriction. Labs 06/12/23: WBC 12, hemoglobin 11.3, platelets 240, sodium 131, creatinine 0.43 33, alk phos 123, T. bili 7.1, lipase 37, ethanol <10.  Blood cultures negative.  INR 1.4   Last office visit 06/17/2023. Abdominal distention improving.  Having 1-2 bowel movements per day.  Taking Lasix  twice daily short-term, not currently on Aldactone .  With still having some peripheral edema.  Also recently treated for cellulitis.  Drinking large amounts of water daily previously.  History of cholecystectomy.  Taking iron .  Feeling constantly hungry. Scheduled for EGD and colonoscopy.  Autoimmune serologies ordered.  Advised 2 L fluid restriction and sodium restricted diet.  Ongoing alcohol cessation recommended.  Advised to resume Aldactone  and continue Lasix  40 daily.   Labs 06/21/2023: Elevated ANA, IgA, and IgG negative ANA, AMA, ASMA, antiliver kidney antibody.  AFP and ceruloplasmin within normal limits   EGD 08/11/23: - Non- obstructing Schatzki ring.  - Portal hypertensive gastropathy.  - Normal duodenum.  - No specimens collected.   Flexible sigmoidoscopy 08/11/23:  - Preparation of the colon was inadequate.  - Stool in the sigmoid colon and in the descending colon.  - No specimens collected. - Advised repeat colonoscopy in 3 months.  - Reportedly vomited up majority of her prep.   OV 10/05/23.  Advised to check CBC, CMP, TSH.  Plan for RUQ US  in December and MELD labs in January.  Advised  to start lactulose  10 g twice daily.  Advise she continue Zofran  3 times a day as needed.  Advised to continue PPI once daily.  Continue Lasix  40 mg daily as needed.  Continue midodrine , iron , and folic acid  supplementation.  It is that we would discuss  rescheduling colonoscopy after improvement of nausea and constipation. 2g sodium diet and increased hydration recommended.   Last office visit 01/04/24.  Denied any emesis, abdominal pain, ascites.  Taking Lasix  as needed.  No evidence of ascites or peripheral edema.  Having about 1 bowel movement daily and not taking lactulose  unless she is at home when not at work.  Recently had some dental work done and was taking amoxicillin .  Was not able to eat or drink normally and had been drinking ensures given decreased p.o. intake.  Did note some pruritus and dry skin and still having issues with hair loss.  Taking midodrine  3 times a day and working on weaning off Suboxone .  Advise labs, ultrasound.  Continue Lasix  as needed.  Continue PPI daily and Zofran  as needed.  Continue midodrine  3 times a day and lactulose  twice daily while at home as well as MiraLAX once daily on workdays.  2 g sodium diet continue folic acid  supplementation.  Plans to revisit colonoscopy at next visit.   Today:  Cirrhosis history Hematemesis/coffee ground emesis: None History of variceal bleeding: No Abdominal pain: None Abdominal distention/worsening ascites: None Fever/chills: None Episodes of confusion/disorientation: None Number of daily bowel movements: 1-2.  Not needing lactulose . Taking diuretics?:  Lasix  as needed Date of last EGD: September 2024 with portal hypertensive gastropathy, no varices. Prior history of banding?:  No Prior episodes of SBP: None Last time liver imaging was performed: 01/10/2024 -cirrhosis with no evidence of liver lesion or mass.  CBD 1.1 cm without choledocholithiasis.   MELD score: Needs to be updated.  Previously 17 in July of last year.  Computed MELD 3.0 unavailable. One or more values for this score either were not found within the given timeframe or did not fit some other criterion. Computed MELD-Na unavailable. One or more values for this score either were not found within the given  timeframe or did not fit some other criterion.  Does not feel as though she needs pantoprazole . Had been having trouble sleeping  Tried to get off suboxone  and requested to come off of it and had gotten to where she was doing tramadol  as needed and then had to be released from ortho to PT and then his prescription for tramadol  got booted and then went back to suboxone  but within 2 weeks she was having withdrawal sickness.  No issues with constipation. Has urgency. Not tacking anythig currently. Stil has plenty of lactulose  just in case.   Wt Readings from Last 5 Encounters:  01/04/24 131 lb (59.4 kg)  10/05/23 118 lb 6.4 oz (53.7 kg)  08/05/23 128 lb 4.9 oz (58.2 kg)  06/18/23 128 lb 3.2 oz (58.2 kg)  06/17/23 130 lb (59 kg)    Current Outpatient Medications  Medication Sig Dispense Refill   folic acid  (FOLVITE ) 1 MG tablet TAKE 1 TABLET BY MOUTH DAILY 30 tablet 1   furosemide  (LASIX ) 20 MG tablet Take 0.5 tablets (10 mg total) by mouth daily. (Patient taking differently: Take 10 mg by mouth as needed.) 30 tablet 1   Iron , Ferrous Sulfate , 325 (65 Fe) MG TABS Take 325 mg by mouth daily. 30 tablet 3   midodrine  (PROAMATINE ) 2.5 MG tablet TAKE 1  TABLET BY MOUTH THREE TIMES DAILY WITH MEALS 90 tablet 1   ondansetron  (ZOFRAN -ODT) 8 MG disintegrating tablet TAKE 1 TABLET BY MOUTH EVERY 8 HOURS AS NEEDED FOR NAUSEA AND VOMITING 30 tablet 0   penicillin  v potassium (VEETID) 500 MG tablet Take 500 mg by mouth 4 (four) times daily.     POTASSIUM GLUCONATE PO Take 90 mg by mouth daily.     SUBOXONE  8-2 MG FILM Take 1 Film by mouth daily. (Patient taking differently: Take 1 Film by mouth daily.)     No current facility-administered medications for this visit.    Past Medical History:  Diagnosis Date   Abnormal vaginal Pap smear    ASCUS of cervix with negative high risk HPV 09/01/2021   Repeat papin 3 years, per [facility], 5 year risk for CIN 3+ is 0.40%   Depression    Elevated liver enzymes  08/01/2021   Stop alcohol, recheck at appt, get US  9/1   Migraines    Neuropathy    Pregnant 06/19/2015   Sludge in gallbladder 09/01/2021   Refer to Dr Mavis   Vaginal Pap smear, abnormal     Past Surgical History:  Procedure Laterality Date   BACK SURGERY     CESAREAN SECTION  x 2   CESAREAN SECTION WITH BILATERAL TUBAL LIGATION Bilateral 02/11/2016   Procedure: REPEAT CESAREAN SECTION WITH BILATERAL TUBAL LIGATION;  Surgeon: Norleen Edsel GAILS, MD;  Location: [facility];  Service: Obstetrics;  Laterality: Bilateral;   CHOLECYSTECTOMY N/A 10/22/2021   Procedure: LAPAROSCOPIC CHOLECYSTECTOMY;  Surgeon: Mavis Anes, MD;  Location: [facility];  Service: General;  Laterality: N/A;   ESOPHAGOGASTRODUODENOSCOPY (EGD) WITH PROPOFOL  N/A 08/11/2023   Procedure: ESOPHAGOGASTRODUODENOSCOPY (EGD) WITH PROPOFOL ;  Surgeon: Cindie Carlin POUR, DO;  Location: [facility];  Service: Endoscopy;  Laterality: N/A;   FLEXIBLE SIGMOIDOSCOPY N/A 08/11/2023   Procedure: FLEXIBLE SIGMOIDOSCOPY;  Surgeon: Cindie Carlin POUR, DO;  Location: [facility];  Service: Endoscopy;  Laterality: N/A;  10:00 am, asa 3   TONSILLECTOMY AND ADENOIDECTOMY      Family History  Problem Relation Age of Onset   Dementia Paternal Grandfather    Dementia Paternal Grandmother     Allergies as of 07/03/2024 - Review Complete 07/03/2024  Allergen Reaction Noted   Zomig [zolmitriptan] Shortness Of Breath and Swelling 07/30/2011    Social History   Socioeconomic History   Marital status: Divorced    Spouse name: Not on file   Number of children: Not on file   Years of education: 2 yr coll.   Highest education level: Not on file  Occupational History   Occupation: event planner  Tobacco Use   Smoking status: Every Day    Current packs/day: 1.00    Average packs/day: 1 pack/day for 20.0 years (20.0 ttl pk-yrs)    Types: Cigarettes    Start date: 08/02/2007   Smokeless tobacco: Never   Tobacco comments:    smokes 6 cig per day   Vaping Use   Vaping status: Never Used  Substance and Sexual Activity   Alcohol use: Yes    Comment: occ   Drug use: No    Types: Marijuana   Sexual activity: Not Currently    Birth control/protection: Surgical    Comment: tubal  Other Topics Concern   Not on file  Social History Narrative   Not on file   Social Drivers of Health   Financial Resource Strain: High Risk (08/20/2021)   Overall Financial Resource Strain (  CARDIA)    Difficulty of Paying Living Expenses: Very hard  Food Insecurity: No Food Insecurity (05/25/2023)   Hunger Vital Sign    Worried About Running Out of Food in the Last Year: Never true    Ran Out of Food in the Last Year: Never true  Transportation Needs: No Transportation Needs (05/25/2023)   PRAPARE - Administrator, Civil Service (Medical): No    Lack of Transportation (Non-Medical): No  Physical Activity: Inactive (08/20/2021)   Exercise Vital Sign    Days of Exercise per Week: 0 days    Minutes of Exercise per Session: 0 min  Stress: Stress Concern Present (08/20/2021)   [employer] of Occupational Health - Occupational Stress Questionnaire    Feeling of Stress : Very much  Social Connections: Socially Isolated (08/20/2021)   Social Connection and Isolation Panel    Frequency of Communication with Friends and Family: Three times a week    Frequency of Social Gatherings with Friends and Family: Never    Attends Religious Services: Never    Database administrator or Organizations: No    Attends Engineer, structural: Never    Marital Status: Divorced     Review of Systems   Gen: Denies fever, chills, anorexia. Denies fatigue, weakness, weight loss.  CV: Denies chest pain, palpitations, syncope, peripheral edema, and claudication. Resp: Denies dyspnea at rest, cough, wheezing, coughing up blood, and pleurisy. GI: See HPI Derm: Denies rash, itching, dry skin Psych: Denies depression, anxiety, memory loss, confusion. No  homicidal or suicidal ideation.  Heme: Denies bruising, bleeding, and enlarged lymph nodes.  Physical Exam   LMP 02/13/2021 (Approximate) Comment: LMP WAS IN MARCH  General:   Alert and oriented. No distress noted. Pleasant and cooperative.  Head:  Normocephalic and atraumatic. Eyes:  Conjuctiva clear without scleral icterus. Abdomen:  +BS, soft, non-tender and non-distended. No rebound or guarding. No HSM or masses noted. Rectal: deferred Msk:  Symmetrical without gross deformities. Normal posture. Extremities: Nonpitting edema bilaterally Neurologic:  Alert and  oriented x4.  No asterixis. Psych:  Alert and cooperative. Normal mood and affect.  Assessment  Shauntia Levengood is a 43 y.o. female presenting today for cirrhosis follow up.   Cirrhosis: - Secondary to prior alcohol abuse after the loss of her daughter.  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 - Gastropathy on EGD September 2024 but no varices. - No significant ascites, mild peripheral edema but not taking Lasix .  Previously taking as needed - No signs of hepatic encephalopathy - Ultrasound in February with no evidence of hepatoma, due for ultrasound next month - Prior MELD score 17 in July of last year, no recent updated labs.  Did not complete labs in January as previously ordered.  In need of updating currently, encouraged her to complete day of ultrasound - Advised to take lactulose  if signs/symptoms of confusion and/or constipation. - Will continue folic acid  (refilled today )and iron  supplementation as well as midodrine   Constipation: Resume lactulose  and/or MiraLAX as needed.  Currently having 1-2 bowel movements daily.  Anemia: History of iron  deficiency.  Last labs with stable hemoglobin, due for recheck.  Prior attempted colonoscopy unsuccessful given poor prep.  EGD with hypertensive gastropathy with no varices.  Has occasional fatigue.  Given reports of hair loss we will also check TSH and  update all labs.  Given poor prep, she is eager to repeat colonoscopy now given she is out of work given prior  knee/leg fracture about 3 and half months ago.  PLAN   Proceed with colonoscopy with propofol  by Dr. Cindie  in near future: the risks, benefits, and alternatives have been discussed with the patient in detail. The patient states understanding and desires to proceed. ASA 3 (ok room 1/2)  Zofran  as needed for nausea  Lactulose  10 g daily for 4 days prior to prep Trilyte split prep CBC, CMP, INR, AFP, iron  panel RUQ US  next month 2 g sodium diet Continue Lasix  20 mg as needed Continue iron  and folic acid  Continue midodrine  2.5 mg 3 times daily Continue lactulose  as needed.  Continue Zofran  as needed Follow up 6 months.     Charmaine Melia, MSN, FNP-BC, AGACNP-BC [facility]

## 2024-07-04 ENCOUNTER — Encounter: Payer: Self-pay | Admitting: *Deleted

## 2024-07-04 ENCOUNTER — Other Ambulatory Visit: Payer: Self-pay | Admitting: *Deleted

## 2024-07-04 MED ORDER — PEG 3350-KCL-NA BICARB-NACL 420 G PO SOLR: 4000.0000 mL | Freq: Once | ORAL | 0 refills | Status: AC

## 2024-07-12 ENCOUNTER — Ambulatory Visit (HOSPITAL_COMMUNITY): Attending: Gastroenterology

## 2024-07-18 NOTE — Patient Instructions (Signed)
 Pamela [name]  07/18/2024     @PREFPERIOPPHARMACY @   Your procedure is scheduled on  07/24/2024.   [facility] to [facility] at  0730  A.M.   Call this number if you have problems the morning of surgery:  [phone]  If you experience any cold or flu symptoms such as cough, fever, chills, shortness of breath, etc. between now and your scheduled surgery, please notify us  at the above number.   Remember:  Follow the diet and prep instructions given to you by the office.   You may drink clear liquids until 0530 am on 07/24/2024.    Clear liquids allowed are:                    Water, Juice (No red color; non-citric and without pulp; diabetics please choose diet or no sugar options), Carbonated beverages (diabetics please choose diet or no sugar options), Clear Tea (No creamer, milk, or cream, including half & half and powdered creamer), Black Coffee Only (No creamer, milk or cream, including half & half and powdered creamer), and Clear Sports drink (No red color; diabetics please choose diet or no sugar options)    Take these medicines the morning of surgery with A SIP OF WATER                                     midodrine , zofran  (if needed).    Do not wear jewelry, make-up or nail polish, including gel polish,  artificial nails, or any other type of covering on natural nails (fingers and  toes).  Do not wear lotions, powders, or perfumes, or deodorant.  Do not shave 48 hours prior to surgery.  Men may shave face and neck.  Do not bring valuables to the hospital.  [facility] is not responsible for any belongings or valuables.  Contacts, dentures or bridgework may not be worn into surgery.  Leave your suitcase in the car.  After surgery it may be brought to your room.  For patients admitted to the hospital, discharge time will be determined by your treatment team.  Patients discharged the day of surgery will not be allowed to drive home and must have someone with them for  24 hours.    Special instructions:   DO NOT smoke tobacco or vape for 24 hours before your procedure.  Please read over the following fact sheets that you were given. Anesthesia Post-op Instructions and Care and Recovery After Surgery      Colonoscopy, Adult, Care After The following information offers guidance on how to care for yourself after your procedure. Your health care provider may also give you more specific instructions. If you have problems or questions, contact your health care provider. What can I expect after the procedure? After the procedure, it is common to have: A small amount of blood in your stool for 24 hours after the procedure. Some gas. Mild cramping or bloating of your abdomen. Follow these instructions at home: Eating and drinking  Drink enough fluid to keep your urine pale yellow. Follow instructions from your health care provider about eating or drinking restrictions. Resume your normal diet as told by your health care provider. Avoid heavy or fried foods that are hard to digest. Activity Rest as told by your health care provider. Avoid sitting for a long time without moving. Get up to take  short walks every 1-2 hours. This is important to improve blood flow and breathing. Ask for help if you feel weak or unsteady. Return to your normal activities as told by your health care provider. Ask your health care provider what activities are safe for you. Managing cramping and bloating  Try walking around when you have cramps or feel bloated. If directed, apply heat to your abdomen as told by your health care provider. Use the heat source that your health care provider recommends, such as a moist heat pack or a heating pad. Place a towel between your skin and the heat source. Leave the heat on for 20-30 minutes. Remove the heat if your skin turns bright red. This is especially important if you are unable to feel pain, heat, or cold. You have a greater risk of  getting burned. General instructions If you were given a sedative during the procedure, it can affect you for several hours. Do not drive or operate machinery until your health care provider says that it is safe. For the first 24 hours after the procedure: Do not sign important documents. Do not drink alcohol. Do your regular daily activities at a slower pace than normal. Eat soft foods that are easy to digest. Take over-the-counter and prescription medicines only as told by your health care provider. Keep all follow-up visits. This is important. Contact a health care provider if: You have blood in your stool 2-3 days after the procedure. Get help right away if: You have more than a small spotting of blood in your stool. You have large blood clots in your stool. You have swelling of your abdomen. You have nausea or vomiting. You have a fever. You have increasing pain in your abdomen that is not relieved with medicine. These symptoms may be an emergency. Get help right away. Call 911. Do not wait to see if the symptoms will go away. Do not drive yourself to the hospital. Summary After the procedure, it is common to have a small amount of blood in your stool. You may also have mild cramping and bloating of your abdomen. If you were given a sedative during the procedure, it can affect you for several hours. Do not drive or operate machinery until your health care provider says that it is safe. Get help right away if you have a lot of blood in your stool, nausea or vomiting, a fever, or increased pain in your abdomen. This information is not intended to replace advice given to you by your health care provider. Make sure you discuss any questions you have with your health care provider. Document Revised: 01/05/2023 Document Reviewed: 07/16/2021 Elsevier Patient Education  2024 [employer]General Anesthesia, Adult, Care After The following information offers guidance on how to care for  yourself after your procedure. Your health care provider may also give you more specific instructions. If you have problems or questions, contact your health care provider. What can I expect after the procedure? After the procedure, it is common for people to: Have pain or discomfort at the IV site. Have nausea or vomiting. Have a sore throat or hoarseness. Have trouble concentrating. Feel cold or chills. Feel weak, sleepy, or tired (fatigue). Have soreness and body aches. These can affect parts of the body that were not involved in surgery. Follow these instructions at home: For the time period you were told by your health care provider:  Rest. Do not participate in activities where you could fall or become injured. Do not  drive or use machinery. Do not drink alcohol. Do not take sleeping pills or medicines that cause drowsiness. Do not make important decisions or sign legal documents. Do not take care of children on your own. General instructions Drink enough fluid to keep your urine pale yellow. If you have sleep apnea, surgery and certain medicines can increase your risk for breathing problems. Follow instructions from your health care provider about wearing your sleep device: Anytime you are sleeping, including during daytime naps. While taking prescription pain medicines, sleeping medicines, or medicines that make you drowsy. Return to your normal activities as told by your health care provider. Ask your health care provider what activities are safe for you. Take over-the-counter and prescription medicines only as told by your health care provider. Do not use any products that contain nicotine or tobacco. These products include cigarettes, chewing tobacco, and vaping devices, such as e-cigarettes. These can delay incision healing after surgery. If you need help quitting, ask your health care provider. Contact a health care provider if: You have nausea or vomiting that does not get  better with medicine. You vomit every time you eat or drink. You have pain that does not get better with medicine. You cannot urinate or have bloody urine. You develop a skin rash. You have a fever. Get help right away if: You have trouble breathing. You have chest pain. You vomit blood. These symptoms may be an emergency. Get help right away. Call 911. Do not wait to see if the symptoms will go away. Do not drive yourself to the hospital. Summary After the procedure, it is common to have a sore throat, hoarseness, nausea, vomiting, or to feel weak, sleepy, or fatigue. For the time period you were told by your health care provider, do not drive or use machinery. Get help right away if you have difficulty breathing, have chest pain, or vomit blood. These symptoms may be an emergency. This information is not intended to replace advice given to you by your health care provider. Make sure you discuss any questions you have with your health care provider. Document Revised: 02/20/2022 Document Reviewed: 02/20/2022 Elsevier Patient Education  2024 [employer].

## 2024-07-19 ENCOUNTER — Other Ambulatory Visit (HOSPITAL_COMMUNITY)
Admission: RE | Admit: 2024-07-19 | Discharge: 2024-07-19 | Disposition: A | Discharge status: Home / Self Care | Source: Ambulatory Visit | Attending: Gastroenterology | Admitting: Gastroenterology

## 2024-07-19 DIAGNOSIS — D539 Nutritional anemia, unspecified: Secondary | ICD-10-CM | POA: Diagnosis not present

## 2024-07-19 DIAGNOSIS — E611 Iron deficiency: Secondary | ICD-10-CM | POA: Insufficient documentation

## 2024-07-19 DIAGNOSIS — K7030 Alcoholic cirrhosis of liver without ascites: Secondary | ICD-10-CM | POA: Diagnosis present

## 2024-07-19 LAB — PROTIME-INR
INR: 1.3 — ABNORMAL HIGH (ref 0.8–1.2)
Prothrombin Time: 16.8 s — ABNORMAL HIGH (ref 11.4–15.2)

## 2024-07-19 LAB — COMPREHENSIVE METABOLIC PANEL WITH GFR
ALT: 39 U/L (ref 0–44)
AST: 63 U/L — ABNORMAL HIGH (ref 15–41)
Albumin: 3.8 g/dL (ref 3.5–5.0)
Alkaline Phosphatase: 214 U/L — ABNORMAL HIGH (ref 38–126)
Anion gap: 12 (ref 5–15)
BUN: 7 mg/dL (ref 6–20)
CO2: 24 mmol/L (ref 22–32)
Calcium: 9 mg/dL (ref 8.9–10.3)
Chloride: 100 mmol/L (ref 98–111)
Creatinine, Ser: 0.46 mg/dL (ref 0.44–1.00)
GFR, Estimated: >60 mL/min (ref >60)
Glucose, Bld: 142 mg/dL — ABNORMAL HIGH (ref 70–99)
Potassium: 3.7 mmol/L (ref 3.5–5.1)
Sodium: 136 mmol/L (ref 135–145)
Total Bilirubin: 1.6 mg/dL — ABNORMAL HIGH (ref 0.0–1.2)
Total Protein: 7.6 g/dL (ref 6.5–8.1)

## 2024-07-19 LAB — IRON AND TIBC
Iron: 219 ug/dL — ABNORMAL HIGH (ref 28–170)
Saturation Ratios: 55 % — ABNORMAL HIGH (ref 10.4–31.8)
TIBC: 400 ug/dL (ref 250–450)
UIBC: 181 ug/dL

## 2024-07-19 LAB — CBC
HCT: 46.9 % — ABNORMAL HIGH (ref 36.0–46.0)
Hemoglobin: 16.1 g/dL — ABNORMAL HIGH (ref 12.0–15.0)
MCH: 32.3 pg (ref 26.0–34.0)
MCHC: 34.3 g/dL (ref 30.0–36.0)
MCV: 94 fL (ref 80.0–100.0)
Platelets: 113 K/uL — ABNORMAL LOW (ref 150–400)
RBC: 4.99 MIL/uL (ref 3.87–5.11)
RDW: 14.6 % (ref 11.5–15.5)
WBC: 5.8 K/uL (ref 4.0–10.5)
nRBC: 0 % (ref 0.0–0.2)

## 2024-07-19 LAB — FERRITIN: Ferritin: 66 ng/mL (ref 11–307)

## 2024-07-19 LAB — T4, FREE: Free T4: 0.76 ng/dL (ref 0.61–1.12)

## 2024-07-19 LAB — TSH: TSH: 0.756 u[IU]/mL (ref 0.350–4.500)

## 2024-07-20 ENCOUNTER — Encounter (HOSPITAL_COMMUNITY)
Admission: RE | Admit: 2024-07-20 | Discharge: 2024-07-20 | Disposition: A | Discharge status: Home / Self Care | Source: Ambulatory Visit | Attending: Internal Medicine | Admitting: Internal Medicine

## 2024-07-20 ENCOUNTER — Other Ambulatory Visit: Payer: Self-pay

## 2024-07-20 ENCOUNTER — Encounter (HOSPITAL_COMMUNITY): Payer: Self-pay

## 2024-07-20 VITALS — BP 139/72 | HR 72 | Temp 98.1°F | Resp 18 | Ht 64.0 in | Wt 138.1 lb

## 2024-07-20 DIAGNOSIS — E871 Hypo-osmolality and hyponatremia: Secondary | ICD-10-CM

## 2024-07-20 DIAGNOSIS — K7011 Alcoholic hepatitis with ascites: Secondary | ICD-10-CM

## 2024-07-20 DIAGNOSIS — K7031 Alcoholic cirrhosis of liver with ascites: Secondary | ICD-10-CM

## 2024-07-20 DIAGNOSIS — D539 Nutritional anemia, unspecified: Secondary | ICD-10-CM

## 2024-07-20 DIAGNOSIS — E876 Hypokalemia: Secondary | ICD-10-CM

## 2024-07-20 HISTORY — DX: Unspecified cirrhosis of liver: K74.60

## 2024-07-20 HISTORY — DX: Anemia, unspecified: D64.9

## 2024-07-20 LAB — AFP TUMOR MARKER: AFP, Serum, Tumor Marker: 7.7 ng/mL — ABNORMAL HIGH (ref 0.0–6.4)

## 2024-07-24 ENCOUNTER — Encounter (HOSPITAL_COMMUNITY)
Admission: RE | Disposition: A | Discharge status: Home / Self Care | Payer: Self-pay | Source: Home / Self Care | Attending: Internal Medicine

## 2024-07-24 ENCOUNTER — Ambulatory Visit (HOSPITAL_BASED_OUTPATIENT_CLINIC_OR_DEPARTMENT_OTHER): Admitting: Anesthesiology

## 2024-07-24 ENCOUNTER — Ambulatory Visit (HOSPITAL_COMMUNITY)
Admission: RE | Admit: 2024-07-24 | Discharge: 2024-07-24 | Disposition: A | Discharge status: Home / Self Care | Attending: Internal Medicine | Admitting: Internal Medicine

## 2024-07-24 ENCOUNTER — Ambulatory Visit (HOSPITAL_COMMUNITY): Admitting: Anesthesiology

## 2024-07-24 ENCOUNTER — Encounter (HOSPITAL_COMMUNITY): Payer: Self-pay | Admitting: Internal Medicine

## 2024-07-24 ENCOUNTER — Other Ambulatory Visit: Payer: Self-pay

## 2024-07-24 ENCOUNTER — Ambulatory Visit: Payer: Self-pay | Admitting: Gastroenterology

## 2024-07-24 DIAGNOSIS — Z5986 Financial insecurity: Secondary | ICD-10-CM | POA: Insufficient documentation

## 2024-07-24 DIAGNOSIS — G709 Myoneural disorder, unspecified: Secondary | ICD-10-CM | POA: Diagnosis not present

## 2024-07-24 DIAGNOSIS — K7031 Alcoholic cirrhosis of liver with ascites: Secondary | ICD-10-CM

## 2024-07-24 DIAGNOSIS — D649 Anemia, unspecified: Secondary | ICD-10-CM | POA: Insufficient documentation

## 2024-07-24 DIAGNOSIS — M5432 Sciatica, left side: Secondary | ICD-10-CM | POA: Diagnosis not present

## 2024-07-24 DIAGNOSIS — E876 Hypokalemia: Secondary | ICD-10-CM

## 2024-07-24 DIAGNOSIS — D123 Benign neoplasm of transverse colon: Secondary | ICD-10-CM

## 2024-07-24 DIAGNOSIS — K7011 Alcoholic hepatitis with ascites: Secondary | ICD-10-CM

## 2024-07-24 DIAGNOSIS — I10 Essential (primary) hypertension: Secondary | ICD-10-CM | POA: Insufficient documentation

## 2024-07-24 DIAGNOSIS — F418 Other specified anxiety disorders: Secondary | ICD-10-CM | POA: Diagnosis not present

## 2024-07-24 DIAGNOSIS — K648 Other hemorrhoids: Secondary | ICD-10-CM | POA: Diagnosis not present

## 2024-07-24 DIAGNOSIS — D509 Iron deficiency anemia, unspecified: Secondary | ICD-10-CM | POA: Insufficient documentation

## 2024-07-24 DIAGNOSIS — K219 Gastro-esophageal reflux disease without esophagitis: Secondary | ICD-10-CM | POA: Insufficient documentation

## 2024-07-24 DIAGNOSIS — K7030 Alcoholic cirrhosis of liver without ascites: Secondary | ICD-10-CM | POA: Insufficient documentation

## 2024-07-24 DIAGNOSIS — F17210 Nicotine dependence, cigarettes, uncomplicated: Secondary | ICD-10-CM | POA: Insufficient documentation

## 2024-07-24 DIAGNOSIS — D539 Nutritional anemia, unspecified: Secondary | ICD-10-CM

## 2024-07-24 DIAGNOSIS — E871 Hypo-osmolality and hyponatremia: Secondary | ICD-10-CM

## 2024-07-24 HISTORY — PX: COLONOSCOPY: SHX5424

## 2024-07-24 SURGERY — COLONOSCOPY
Anesthesia: General

## 2024-07-24 MED ORDER — LACTATED RINGERS IV SOLN: INTRAVENOUS | Status: DC

## 2024-07-24 MED ORDER — PROPOFOL 10 MG/ML IV BOLUS
INTRAVENOUS | Status: DC | PRN
  Administered 2024-07-24: 50 mg via INTRAVENOUS
  Administered 2024-07-24: 30 mg via INTRAVENOUS
  Administered 2024-07-24: 50 mg via INTRAVENOUS

## 2024-07-24 MED ORDER — PROPOFOL 500 MG/50ML IV EMUL
INTRAVENOUS | Status: DC | PRN
  Administered 2024-07-24: 125 ug/kg/min via INTRAVENOUS

## 2024-07-24 MED ORDER — PHENYLEPHRINE HCL (PRESSORS) 10 MG/ML IV SOLN
INTRAVENOUS | Status: DC | PRN
  Administered 2024-07-24: 80 ug via INTRAVENOUS

## 2024-07-24 MED ORDER — LACTATED RINGERS IV SOLN: INTRAVENOUS | Status: DC | PRN

## 2024-07-24 NOTE — Discharge Instructions (Addendum)
  Colonoscopy Discharge Instructions  Read the instructions outlined below and refer to this sheet in the next few weeks. These discharge instructions provide you with general information on caring for yourself after you leave the hospital. Your doctor may also give you specific instructions. While your treatment has been planned according to the most current medical practices available, unavoidable complications occasionally occur.   ACTIVITY You may resume your regular activity, but move at a slower pace for the next 24 hours.  Take frequent rest periods for the next 24 hours.  Walking will help get rid of the air and reduce the bloated feeling in your belly (abdomen).  No driving for 24 hours (because of the medicine (anesthesia) used during the test).   Do not sign any important legal documents or operate any machinery for 24 hours (because of the anesthesia used during the test).  NUTRITION Drink plenty of fluids.  You may resume your normal diet as instructed by your doctor.  Begin with a light meal and progress to your normal diet. Heavy or fried foods are harder to digest and may make you feel sick to your stomach (nauseated).  Avoid alcoholic beverages for 24 hours or as instructed.  MEDICATIONS You may resume your normal medications unless your doctor tells you otherwise.  WHAT YOU CAN EXPECT TODAY Some feelings of bloating in the abdomen.  Passage of more gas than usual.  Spotting of blood in your stool or on the toilet paper.  IF YOU HAD POLYPS REMOVED DURING THE COLONOSCOPY: No aspirin  products for 7 days or as instructed.  No alcohol for 7 days or as instructed.  Eat a soft diet for the next 24 hours.  FINDING OUT THE RESULTS OF YOUR TEST Not all test results are available during your visit. If your test results are not back during the visit, make an appointment with your caregiver to find out the results. Do not assume everything is normal if you have not heard from your  caregiver or the medical facility. It is important for you to follow up on all of your test results.  SEEK IMMEDIATE MEDICAL ATTENTION IF: You have more than a spotting of blood in your stool.  Your belly is swollen (abdominal distention).  You are nauseated or vomiting.  You have a temperature over 101.  You have abdominal pain or discomfort that is severe or gets worse throughout the day.   Your colonoscopy revealed 2 polyp(s) which I removed successfully. Await pathology results, my office will contact you. I recommend repeating colonoscopy in 7 years for surveillance purposes.   Follow up in GI office in 6 months  I hope you have a great rest of your week!  Carlin POUR. Cindie, D.O. Gastroenterology and Hepatology [facility]

## 2024-07-24 NOTE — Anesthesia Postprocedure Evaluation (Signed)
 Anesthesia Post Note  Patient: Pamela [name]  Procedure(s) Performed: COLONOSCOPY  Patient location during evaluation: Phase II Anesthesia Type: General Level of consciousness: awake and alert Pain management: pain level controlled Vital Signs Assessment: post-procedure vital signs reviewed and stable Respiratory status: spontaneous breathing, nonlabored ventilation and respiratory function stable Cardiovascular status: stable Anesthetic complications: no   There were no known notable events for this encounter.   Last Vitals:  Vitals:   07/24/24 0837 07/24/24 0954  BP: (P) 118/73 118/67  Pulse: (P) 76 74  Resp: (!) (P) 8 13  Temp: (P) 36.8 C 36.4 C  SpO2: (P) 96% 100%    Last Pain:  Vitals:   07/24/24 0954  TempSrc: Oral  PainSc: 0-No pain                 Hayden Kihara L Kaida Games

## 2024-07-24 NOTE — Interval H&P Note (Signed)
 History and Physical Interval Note:  07/24/2024 9:11 AM  Pamela [name]  has presented today for surgery, with the diagnosis of ANEMIA.  The various methods of treatment have been discussed with the patient and family. After consideration of risks, benefits and other options for treatment, the patient has consented to  Procedure(s) with comments: COLONOSCOPY (N/A) - 9:30 am, asa 3 as a surgical intervention.  The patient's history has been reviewed, patient examined, no change in status, stable for surgery.  I have reviewed the patient's chart and labs.  Questions were answered to the patient's satisfaction.     Carlin MARLA Hasty

## 2024-07-24 NOTE — Op Note (Signed)
 [facility] Patient Name: Pamela [name] Procedure Date: 07/24/2024 9:11 AM MRN: [id number] Date of Birth: May 25, 1981 Attending MD: Carlin POUR. Cindie , [place], [id number] CSN: [id number] Age: 43 Admit Type: Outpatient Procedure:                Colonoscopy Indications:              Iron  deficiency anemia Providers:                Carlin POUR. Cindie, DO, Jon LABOR. Gerome RN, RN,                            Jon Loge Referring MD:              Medicines:                See the Anesthesia note for documentation of the                            administered medications Complications:            No immediate complications. Estimated Blood Loss:     Estimated blood loss was minimal. Procedure:                Pre-Anesthesia Assessment:                           - The anesthesia plan was to use monitored                            anesthesia care (MAC).                           After obtaining informed consent, the colonoscope                            was passed under direct vision. Throughout the                            procedure, the patient's blood pressure, pulse, and                            oxygen saturations were monitored continuously. The                            PCF-HQ190L ([id number]) Peds Colon was introduced                            through the anus and advanced to the the terminal                            ileum, with identification of the appendiceal                            orifice and IC valve. The colonoscopy was performed                            without difficulty. The patient tolerated  the                            procedure well. The quality of the bowel                            preparation was evaluated using the BBPS [facility]                            Bowel Preparation Scale) with scores of: Right                            Colon = 3, Transverse Colon = 3 and Left Colon = 3                            (entire mucosa seen well with no residual  staining,                            small fragments of stool or opaque liquid). The                            total BBPS score equals 9. Scope In: 9:29:00 AM Scope Out: 9:44:25 AM Scope Withdrawal Time: 0 hours 10 minutes 38 seconds  Total Procedure Duration: 0 hours 15 minutes 25 seconds  Findings:      Non-bleeding internal hemorrhoids were found.      The terminal ileum appeared normal.      Two sessile polyps were found in the transverse colon. The polyps were 7       to 8 mm in size. These polyps were removed with a cold snare. Resection       and retrieval were complete.      The exam was otherwise without abnormality. Impression:               - Non-bleeding internal hemorrhoids.                           - The examined portion of the ileum was normal.                           - Two 7 to 8 mm polyps in the transverse colon,                            removed with a cold snare. Resected and retrieved.                           - The examination was otherwise normal. Moderate Sedation:      Per Anesthesia Care Recommendation:           - Patient has a contact number available for                            emergencies. The signs and symptoms of potential  delayed complications were discussed with the                            patient. Return to normal activities tomorrow.                            Written discharge instructions were provided to the                            patient.                           - Resume previous diet.                           - Continue present medications.                           - Await pathology results.                           - Repeat colonoscopy in 7 years for surveillance.                           - Return to GI clinic in 6 months. Procedure Code(s):        --- Professional ---                           [phone], Colonoscopy, flexible; with removal of                            tumor(s), polyp(s), or other  lesion(s) by snare                            technique Diagnosis Code(s):        --- Professional ---                           K64.8, Other hemorrhoids                           D12.3, Benign neoplasm of transverse colon (hepatic                            flexure or splenic flexure)                           D50.9, Iron  deficiency anemia, unspecified CPT copyright 2022 [employer]. All rights reserved. The codes documented in this report are preliminary and upon coder review may  be revised to meet current compliance requirements. Carlin POUR. Cindie, DO Carlin POUR. Cindie, DO 07/24/2024 9:48:24 AM This report has been signed electronically. Number of Addenda: 0

## 2024-07-24 NOTE — Transfer of Care (Signed)
 Immediate Anesthesia Transfer of Care Note  Patient: Pamela [name]  Procedure(s) Performed: COLONOSCOPY  Patient Location: PACU  Anesthesia Type:MAC  Level of Consciousness: awake and alert   Airway & Oxygen Therapy: Patient Spontanous Breathing and Patient connected to nasal cannula oxygen  Post-op Assessment: Report given to RN and Post -op Vital signs reviewed and stable  Post vital signs: Reviewed and stable  Last Vitals:  Vitals Value Taken Time  BP 118/67 07/24/24 09:54  Temp 36.4 C 07/24/24 09:54  Pulse 74 07/24/24 09:54  Resp 13 07/24/24 09:54  SpO2 100 % 07/24/24 09:54    Last Pain:  Vitals:   07/24/24 0954  TempSrc: Oral  PainSc: 0-No pain      Patients Stated Pain Goal: (P) 4 (07/24/24 0837)  Complications: No notable events documented.

## 2024-07-24 NOTE — Anesthesia Preprocedure Evaluation (Addendum)
 Anesthesia Evaluation  Patient identified by MRN, date of birth, ID band Patient awake    Reviewed: Allergy & Precautions, H&P , NPO status , Patient's Chart, lab work & pertinent test results  Airway Mallampati: II  TM Distance: >3 FB Neck ROM: Full    Dental  (+) Dental Advisory Given, Caps All upper front are caps:   Pulmonary Current Smoker and Patient abstained from smoking.   Pulmonary exam normal breath sounds clear to auscultation       Cardiovascular negative cardio ROS Normal cardiovascular exam Rhythm:Regular Rate:Normal  05-Aug-2023 08:20:10 [facility]-OPS ROUTINE RECORD [birth year] (42 yr) Female Caucasian Vent. rate 89 BPM PR interval 150 ms QRS duration 76 ms QT/QTcB 402/489 ms P-R-T axes 80 82 70 Normal sinus rhythm Nonspecific ST abnormality Prolonged QT Abnormal ECG When compared with ECG of 20-Oct-2021 22:16,ST changes much improved PREVIOUS ECG IS PRESENT Confirmed by Sheryle Carwin [phone]) on 08/05/2023 6:01:04 PM   Neuro/Psych  Headaches PSYCHIATRIC DISORDERS Anxiety Depression     Neuromuscular disease (left sided sciatica)    GI/Hepatic ,GERD  Medicated,,(+) Cirrhosis     substance abuse  alcohol use  Endo/Other   Hyperthyroidism   Renal/GU negative Renal ROS  negative genitourinary   Musculoskeletal negative musculoskeletal ROS (+)    Abdominal   Peds negative pediatric ROS (+)  Hematology negative hematology ROS (+)   Anesthesia Other Findings   Reproductive/Obstetrics negative OB ROS                              Anesthesia Physical Anesthesia Plan  ASA: 3  Anesthesia Plan: General   Post-op Pain Management: Minimal or no pain anticipated   Induction: Intravenous  PONV Risk Score and Plan: 1 and Propofol  infusion  Airway Management Planned: Nasal Cannula and Natural Airway  Additional Equipment: None  Intra-op Plan:    Post-operative Plan:   Informed Consent: I have reviewed the patients History and Physical, chart, labs and discussed the procedure including the risks, benefits and alternatives for the proposed anesthesia with the patient or authorized representative who has indicated his/her understanding and acceptance.     Dental advisory given  Plan Discussed with: CRNA  Anesthesia Plan Comments:         Anesthesia Quick Evaluation

## 2024-07-25 ENCOUNTER — Encounter (HOSPITAL_COMMUNITY): Payer: Self-pay | Admitting: Internal Medicine

## 2024-07-25 ENCOUNTER — Other Ambulatory Visit: Payer: Self-pay | Admitting: Gastroenterology

## 2024-07-25 LAB — SURGICAL PATHOLOGY

## 2024-07-26 ENCOUNTER — Other Ambulatory Visit: Payer: Self-pay | Admitting: *Deleted

## 2024-07-26 DIAGNOSIS — E611 Iron deficiency: Secondary | ICD-10-CM

## 2024-07-26 DIAGNOSIS — K7011 Alcoholic hepatitis with ascites: Secondary | ICD-10-CM

## 2024-07-26 DIAGNOSIS — K7200 Acute and subacute hepatic failure without coma: Secondary | ICD-10-CM

## 2024-07-31 ENCOUNTER — Ambulatory Visit (HOSPITAL_COMMUNITY)
Admission: RE | Admit: 2024-07-31 | Discharge: 2024-07-31 | Disposition: A | Discharge status: Home / Self Care | Source: Ambulatory Visit | Attending: Gastroenterology | Admitting: Gastroenterology

## 2024-07-31 DIAGNOSIS — K7030 Alcoholic cirrhosis of liver without ascites: Secondary | ICD-10-CM | POA: Diagnosis present

## 2024-07-31 DIAGNOSIS — E611 Iron deficiency: Secondary | ICD-10-CM | POA: Diagnosis present

## 2024-07-31 DIAGNOSIS — D539 Nutritional anemia, unspecified: Secondary | ICD-10-CM | POA: Diagnosis present

## 2024-08-03 ENCOUNTER — Ambulatory Visit: Payer: Self-pay | Admitting: Gastroenterology

## 2024-08-08 ENCOUNTER — Ambulatory Visit: Payer: Self-pay | Admitting: Internal Medicine

## 2024-09-20 ENCOUNTER — Encounter (INDEPENDENT_AMBULATORY_CARE_PROVIDER_SITE_OTHER): Payer: Self-pay | Admitting: Gastroenterology

## 2024-10-24 ENCOUNTER — Other Ambulatory Visit: Payer: Self-pay | Admitting: *Deleted

## 2024-10-24 DIAGNOSIS — K7011 Alcoholic hepatitis with ascites: Secondary | ICD-10-CM

## 2024-10-24 DIAGNOSIS — K7200 Acute and subacute hepatic failure without coma: Secondary | ICD-10-CM

## 2024-10-24 DIAGNOSIS — E611 Iron deficiency: Secondary | ICD-10-CM
